# Patient Record
Sex: Male | Born: 1935 | Race: White | Hispanic: No | Smoking: Never smoker
Health system: Southern US, Community
[De-identification: ages and names within clinical notes are randomized; demographics above are authoritative.]

## PROBLEM LIST (undated history)

## (undated) DIAGNOSIS — I10 Essential (primary) hypertension: Secondary | ICD-10-CM

## (undated) DIAGNOSIS — N289 Disorder of kidney and ureter, unspecified: Secondary | ICD-10-CM

## (undated) DIAGNOSIS — G20 Parkinson's disease: Secondary | ICD-10-CM

## (undated) DIAGNOSIS — I639 Cerebral infarction, unspecified: Secondary | ICD-10-CM

## (undated) DIAGNOSIS — N401 Enlarged prostate with lower urinary tract symptoms: Secondary | ICD-10-CM

## (undated) DIAGNOSIS — G20A1 Parkinson's disease without dyskinesia, without mention of fluctuations: Secondary | ICD-10-CM

## (undated) DIAGNOSIS — C801 Malignant (primary) neoplasm, unspecified: Secondary | ICD-10-CM

## (undated) DIAGNOSIS — N138 Other obstructive and reflux uropathy: Secondary | ICD-10-CM

## (undated) HISTORY — PX: APPENDECTOMY: SHX54

## (undated) HISTORY — PX: CYSTOSCOPY: SUR368

## (undated) HISTORY — PX: CATARACT EXTRACTION: SUR2

## (undated) HISTORY — DX: Benign prostatic hyperplasia with lower urinary tract symptoms: N13.8

## (undated) HISTORY — DX: Other obstructive and reflux uropathy: N40.1

## (undated) HISTORY — PX: HEMORRHOID SURGERY: SHX153

## (undated) HISTORY — PX: COLONOSCOPY: SHX174

## (undated) HISTORY — DX: Essential (primary) hypertension: I10

---
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
## 2000-08-11 ENCOUNTER — Encounter: Payer: Self-pay | Admitting: Internal Medicine

## 2000-08-11 ENCOUNTER — Ambulatory Visit (HOSPITAL_COMMUNITY): Admission: RE | Admit: 2000-08-11 | Discharge: 2000-08-11 | Payer: Self-pay | Admitting: Internal Medicine

## 2001-06-07 ENCOUNTER — Encounter: Payer: Self-pay | Admitting: General Surgery

## 2001-06-07 ENCOUNTER — Ambulatory Visit (HOSPITAL_COMMUNITY): Admission: RE | Admit: 2001-06-07 | Discharge: 2001-06-07 | Payer: Self-pay | Admitting: General Surgery

## 2001-08-15 ENCOUNTER — Encounter: Payer: Self-pay | Admitting: Internal Medicine

## 2001-08-15 ENCOUNTER — Ambulatory Visit (HOSPITAL_COMMUNITY): Admission: RE | Admit: 2001-08-15 | Discharge: 2001-08-15 | Payer: Self-pay | Admitting: Internal Medicine

## 2001-10-10 ENCOUNTER — Ambulatory Visit (HOSPITAL_COMMUNITY): Admission: RE | Admit: 2001-10-10 | Discharge: 2001-10-10 | Payer: Self-pay | Admitting: Gastroenterology

## 2006-09-18 ENCOUNTER — Ambulatory Visit (HOSPITAL_COMMUNITY): Admission: RE | Admit: 2006-09-18 | Discharge: 2006-09-18 | Payer: Self-pay | Admitting: Ophthalmology

## 2007-04-24 ENCOUNTER — Ambulatory Visit: Admission: RE | Admit: 2007-04-24 | Discharge: 2007-07-03 | Payer: Self-pay | Admitting: Radiation Oncology

## 2007-05-07 ENCOUNTER — Encounter: Admission: RE | Admit: 2007-05-07 | Discharge: 2007-05-07 | Payer: Self-pay | Admitting: Urology

## 2007-05-07 ENCOUNTER — Ambulatory Visit: Admission: RE | Admit: 2007-05-07 | Discharge: 2007-05-07 | Payer: Self-pay | Admitting: Urology

## 2007-05-31 ENCOUNTER — Ambulatory Visit (HOSPITAL_BASED_OUTPATIENT_CLINIC_OR_DEPARTMENT_OTHER): Admission: RE | Admit: 2007-05-31 | Discharge: 2007-05-31 | Payer: Self-pay | Admitting: Urology

## 2009-11-24 ENCOUNTER — Ambulatory Visit (HOSPITAL_COMMUNITY): Admission: RE | Admit: 2009-11-24 | Discharge: 2009-11-24 | Payer: Self-pay | Admitting: Internal Medicine

## 2010-05-25 ENCOUNTER — Encounter: Payer: Self-pay | Admitting: Cardiovascular Disease

## 2010-06-02 DIAGNOSIS — E559 Vitamin D deficiency, unspecified: Secondary | ICD-10-CM

## 2010-06-02 DIAGNOSIS — I10 Essential (primary) hypertension: Secondary | ICD-10-CM

## 2010-06-03 ENCOUNTER — Ambulatory Visit: Payer: Self-pay | Admitting: Cardiovascular Disease

## 2010-06-03 ENCOUNTER — Encounter (INDEPENDENT_AMBULATORY_CARE_PROVIDER_SITE_OTHER): Payer: Self-pay | Admitting: *Deleted

## 2010-06-03 ENCOUNTER — Encounter: Payer: Self-pay | Admitting: Cardiovascular Disease

## 2010-06-03 DIAGNOSIS — E782 Mixed hyperlipidemia: Secondary | ICD-10-CM

## 2010-06-03 LAB — CONVERTED CEMR LAB
Basophils Absolute: 0 10*3/uL (ref 0.0–0.1)
CO2: 28 meq/L (ref 19–32)
Calcium: 9.9 mg/dL (ref 8.4–10.5)
Chloride: 107 meq/L (ref 96–112)
Eosinophils Absolute: 0.7 10*3/uL (ref 0.0–0.7)
GFR calc non Af Amer: 39.76 mL/min (ref >60)
INR: 0.9 (ref 0.8–1.0)
Lymphocytes Relative: 20.1 % (ref 12.0–46.0)
Lymphs Abs: 1.3 10*3/uL (ref 0.7–4.0)
Platelets: 246 10*3/uL (ref 150.0–400.0)
Potassium: 5.1 meq/L (ref 3.5–5.1)
RDW: 14.2 % (ref 11.5–14.6)
Sodium: 142 meq/L (ref 135–145)

## 2010-06-04 ENCOUNTER — Encounter: Payer: Self-pay | Admitting: Cardiovascular Disease

## 2010-06-04 ENCOUNTER — Inpatient Hospital Stay (HOSPITAL_BASED_OUTPATIENT_CLINIC_OR_DEPARTMENT_OTHER): Admission: RE | Admit: 2010-06-04 | Discharge: 2010-06-04 | Payer: Self-pay | Admitting: Cardiology

## 2010-06-04 ENCOUNTER — Ambulatory Visit: Payer: Self-pay | Admitting: Cardiology

## 2010-06-07 ENCOUNTER — Telehealth: Payer: Self-pay | Admitting: Cardiovascular Disease

## 2010-06-22 ENCOUNTER — Encounter: Payer: Self-pay | Admitting: Cardiovascular Disease

## 2010-06-29 ENCOUNTER — Ambulatory Visit: Payer: Self-pay | Admitting: Cardiovascular Disease

## 2010-07-25 HISTORY — PX: TOTAL NEPHRECTOMY: SHX415

## 2010-08-06 ENCOUNTER — Ambulatory Visit
Admission: RE | Admit: 2010-08-06 | Discharge: 2010-08-06 | Payer: Self-pay | Source: Home / Self Care | Attending: Vascular Surgery | Admitting: Vascular Surgery

## 2010-08-17 ENCOUNTER — Encounter
Admission: RE | Admit: 2010-08-17 | Discharge: 2010-08-17 | Payer: Self-pay | Source: Home / Self Care | Attending: Family Medicine | Admitting: Family Medicine

## 2010-08-20 NOTE — Procedures (Unsigned)
RENAL ARTERY DUPLEX EVALUATION  INDICATION:  Hypertension, abnormal CMP.  HISTORY: Diabetes:  No. Cardiac:  No. Hypertension:  Yes. Smoking:  No.  RENAL ARTERY DUPLEX FINDINGS: Aorta-Proximal:  54 cm/s Aorta-Mid:  Not visualized Aorta-Distal:  61 cm/s Celiac Artery Origin:  Not visualized SMA Origin:  Not visualized                                   RIGHT               LEFT Renal Artery Origin:             Not visualized      Not visualized Renal Artery Proximal:           Not visualized      Not visualized Renal Artery Mid:                143 cm/s            117 cm/s Renal Artery Distal:             101 cm/s            58 cm/s Hilar Acceleration Time (AT): Renal-Aortic Ratio (RAR):        2.6                 2.2 Kidney Size:                     11.3 cm             11.4 cm End Diastolic Ratio (EDR): Resistive Index (RI):            0.80                0.82  IMPRESSION: 1. No evidence of hemodynamically significant stenosis noted in the     visualized segments of the bilateral renal arteries, however, the     mid abdominal aorta, celiac, superior mesenteric and bilateral     proximal renal arteries were not adequately visualized due to     overlying bowel gas patterns.  Abnormal bilateral intrarenal     resistive indices were noted. 2. The bilateral kidney length measurements are within normal limits.     There were 2 cystic structures noted near the right renal pelvis in     the lateral border of the right kidney measuring roughly 4 cm and     2.5 cm, respectively.  There appears to be a 6.5 x 5.8 x 7.5 cm     structure with heterogeneous echo texture and internal flow noted     near the upper pole of the right kidney.  ___________________________________________ Larina Earthly, M.D.  CH/MEDQ  D:  08/06/2010  T:  08/06/2010  Job:  [id number]

## 2010-08-23 ENCOUNTER — Encounter: Payer: Self-pay | Admitting: Cardiology

## 2010-08-24 ENCOUNTER — Encounter: Payer: Self-pay | Admitting: Cardiovascular Disease

## 2010-08-24 ENCOUNTER — Telehealth: Payer: Self-pay | Admitting: Cardiovascular Disease

## 2010-08-24 ENCOUNTER — Ambulatory Visit (HOSPITAL_COMMUNITY)
Admission: RE | Admit: 2010-08-24 | Discharge: 2010-08-24 | Payer: Self-pay | Source: Home / Self Care | Attending: Urology | Admitting: Urology

## 2010-08-26 NOTE — Cardiovascular Report (Signed)
Summary: Pre Cath Orders   Pre Cath Orders   Imported By: Roderic Ovens 06/14/2010 16:37:41  _____________________________________________________________________  External Attachment:    Type:   Image     Comment:   External Document

## 2010-08-26 NOTE — Cardiovascular Report (Signed)
Summary: Cardiac Cath Other  Cardiac Cath Other   Imported By: Earl Many 06/29/2010 16:23:13  _____________________________________________________________________  External Attachment:    Type:   Image     Comment:   Cardiac Cath Review

## 2010-08-26 NOTE — Assessment & Plan Note (Signed)
Summary: F1M/POST CATH/DM   CC:  follow up cath.  History of Present Illness: Gary Ortega returns today for F/U post cath. He was done by Dr Swaziland on 11/11.  He had single vessel disease with 40-50% mid LAD and 70% distal disease.  Angio reviewed and agree with medical RX.  ON BB no angina now even when walking up hill.  Still emotional about wifes dementia and will likely need to place her in assisted living.  They have been married 63 years.  Taking ASA and Plavix for previous TIA symptoms.  Cath site healed well with no bruising  Current Problems (verified): 1)  Mixed Hyperlipidemia  (ICD-272.2) 2)  Hypertension  (ICD-401.9) 3)  Fatigue  (ICD-780.79) 4)  Shortness of Breath  (ICD-786.05) 5)  Chest Pain  (ICD-786.50) 6)  Unspecified Vitamin D Deficiency  (ICD-268.9) 7)  Anxiety  (ICD-300.00) 8)  Depression  (ICD-311)  Current Medications (verified): 1)  Plavix 75 Mg Tabs (Clopidogrel Bisulfate) .Marland Kitchen.. 1 Tab Mon, Wednesday, Fri 2)  Aspirin 81 Mg Tbec (Aspirin) .Marland Kitchen.. 1 Tab  Tues, Thurs, Sat, Sunday 3)  Fenofibrate Micronized 134 Mg Caps (Fenofibrate Micronized) .... 1 Tab By Mouth Once Daily 4)  Pravastatin Sodium 40 Mg Tabs (Pravastatin Sodium) .... 1 Tab By Mouth Once Daily 5)  Lisinopril 10 Mg Tabs (Lisinopril) .... Take One Tablet By Mouth Daily 6)  Multivitamins   Tabs (Multiple Vitamin) .... 1 Tab By Mouth Once Daily 7)  Vitamin D 5000 Unit Caps (Cholecalciferol) .... 1 Tab By Mouth Once Daily 8)  Carbidopa-Levodopa 25-250 Mg Tabs (Carbidopa-Levodopa) .... Two Tablets By Mouth Once Daily 9)  Metoprolol Succinate 25 Mg Xr24h-Tab (Metoprolol Succinate) .... Take One Tablet By Mouth Daily 10)  Nitrostat 0.4 Mg Subl (Nitroglycerin) .... 1 Tablet Under Tongue At Onset of Chest Pain; You May Repeat Every 5 Minutes For Up To 3 Doses.  Allergies (verified): No Known Drug Allergies  Past History:  Past Medical History: Last updated: 06/02/2010 HYPERTENSION FATIGUE SHORTNESS OF BREATH    CHEST PAIN UNSPECIFIED VITAMIN D DEFICIENCY ANXIETY DEPRESSION  Past Surgical History: Last updated: 06/02/2010 Prostate radioactive I-125 seed implantation and cystoscopy.    Colonoscopy.  Family History: Last updated: 06/03/2010 non contributory  Social History: Last updated: 06/03/2010 Married 63 years Wife with recent CVA Retired from [employer] Non smoker Non drinker  Review of Systems       Denies fever, malais, weight loss, blurry vision, decreased visual acuity, cough, sputum, SOB, hemoptysis, pleuritic pain, palpitaitons, heartburn, abdominal pain, melena, lower extremity edema, claudication, or rash.   Vital Signs:  Patient profile:   77 year old male Height:      68 inches Weight:      164 pounds BMI:     25.03 Pulse rate:   70 / minute Resp:     14  per minute BP sitting:   118 / 68  (left arm)  Vitals Entered By: Kem Parkinson (June 29, 2010 4:30 PM)  Physical Exam  General:  Affect appropriate Healthy:  appears stated age HEENT: normal Neck supple with no adenopathy JVP normal no bruits no thyromegaly Lungs clear with no wheezing and good diaphragmatic motion Heart:  S1/S2 no murmur,rub, gallop or click PMI normal Abdomen: benighn, BS positve, no tenderness, no AAA no bruit.  No HSM or HJR Distal pulses intact with no bruits No edema Neuro non-focal Skin warm and dry Cath site well healed   Impression & Recommendations:  Problem # 1:  CHEST PAIN (ICD-786.50)  Single vessel disease responding to medical RX.  Can still add imdur or Ranexa in future if needed.  Continue antiplatlets His updated medication list for this problem includes:    Plavix 75 Mg Tabs (Clopidogrel bisulfate) .Marland Kitchen... 1 tab mon, wednesday, fri    Aspirin 81 Mg Tbec (Aspirin) .Marland Kitchen... 1 tab  tues, thurs, sat, sunday    Lisinopril 10 Mg Tabs (Lisinopril) .Marland Kitchen... Take one tablet by mouth daily    Metoprolol Succinate 25 Mg Xr24h-tab (Metoprolol succinate) .Marland Kitchen... Take one  tablet by mouth daily    Nitrostat 0.4 Mg Subl (Nitroglycerin) .Marland Kitchen... 1 tablet under tongue at onset of chest pain; you may repeat every 5 minutes for up to 3 doses.  Problem # 2:  HYPERTENSION (ICD-401.9) Well controlled His updated medication list for this problem includes:    Aspirin 81 Mg Tbec (Aspirin) .Marland Kitchen... 1 tab  tues, thurs, sat, sunday    Lisinopril 10 Mg Tabs (Lisinopril) .Marland Kitchen... Take one tablet by mouth daily    Metoprolol Succinate 25 Mg Xr24h-tab (Metoprolol succinate) .Marland Kitchen... Take one tablet by mouth daily  Problem # 3:  MIXED HYPERLIPIDEMIA (ICD-272.2) Continue statin  Labs per primary His updated medication list for this problem includes:    Fenofibrate Micronized 134 Mg Caps (Fenofibrate micronized) .Marland Kitchen... 1 tab by mouth once daily    Pravastatin Sodium 40 Mg Tabs (Pravastatin sodium) .Marland Kitchen... 1 tab by mouth once daily  Problem # 4:  DEPRESSION (ICD-311) On Tranxene two times a day.  F/U primary  Will need to place spouse to help relieve anxiety.  Discussed with daughter who agrees  Patient Instructions: 1)  Your physician wants you to follow-up in: 6 MONTHS  You will receive a reminder letter in the mail two months in advance. If you don't receive a letter, please call our office to schedule the follow-up appointment.

## 2010-08-26 NOTE — Consult Note (Signed)
Summary: [facility]  [facility]   Imported By: Earl Many 06/02/2010 16:20:26  _____________________________________________________________________  External Attachment:    Type:   Image     Comment:   External Document

## 2010-08-26 NOTE — Cardiovascular Report (Signed)
Summary: [facility]  [facility]   Imported By: Marylou Mccoy 06/28/2010 14:21:26  _____________________________________________________________________  External Attachment:    Type:   Image     Comment:   External Document

## 2010-08-26 NOTE — Letter (Signed)
Summary: Cardiac Catheterization Instructions- [facility]  [facility], Main Office  [address]   [place], [place] [address]   Phone: [phone]  Fax: [phone]     06/03/2010 MRN: [id number]  [facility] [address] [place], [place]  [address]  Dear Mr. HOLTE,   You are scheduled for a Cardiac Catheterization on THURSDAY 06-04-10 with Dr. Swaziland  Please arrive to the 1st floor of the Heart and Vascular Center at [facility] at     11:30 am      on the day of your procedure. Please do not arrive before 6:30 a.m. Call the Heart and Vascular Center at [phone] if you are unable to make your appointmnet. The Code to get into the parking garage under the building is 2000. Take the elevators to the 1st floor. You must have someone to drive you home. Someone must be with you for the first 24 hours after you arrive home. Please wear clothes that are easy to get on and off and wear slip-on shoes. Do not eat or drink after midnight except water with your medications that morning. Bring all your medications and current insurance cards with you.  ___ DO NOT take these medications before your procedure: ________________________________________________________________  ___ Make sure you take your aspirin.  ___ You may take ALL of your medications with water that morning. ________________________________________________________________________________________________________________________________  ___ DO NOT take ANY medications before your procedure.  _XX__ Pre-med instructions:  TAKE 300MG  OF PLAVIX TONIGHT  The usual length of stay after your procedure is 2 to 3 hours. This can vary.  If you have any questions, please call the office at the number listed above.   Deliah Goody, RN

## 2010-08-26 NOTE — Progress Notes (Signed)
Summary: med refill  Phone Note From Other Clinic Call back at [facility] Phone [phone]   Caller: Nurse Summary of Call: Per Lurena Joiner from Dr Elisabeth Most ofc pt needs his toprol filled.  Initial call taken by: Edman Circle,  June 07, 2010 4:30 PM  Follow-up for Phone Call        spoke with pt, metoprolol was started after his cath. pt is not aware of the dosage and will have his dtr call me Deliah Goody, RN  June 08, 2010 11:38 AM     New/Updated Medications: CARBIDOPA-LEVODOPA 25-250 MG TABS (CARBIDOPA-LEVODOPA) two tablets by mouth once daily METOPROLOL SUCCINATE 25 MG XR24H-TAB (METOPROLOL SUCCINATE) Take one tablet by mouth daily Prescriptions: METOPROLOL SUCCINATE 25 MG XR24H-TAB (METOPROLOL SUCCINATE) Take one tablet by mouth daily  #90 x 4   Entered by:   Deliah Goody, RN   Authorized by:   Colon Branch, MD, [facility]   Signed by:   Deliah Goody, RN on 06/08/2010   Method used:   Faxed to ...       [facility] (mail-order)             , [place]         Ph: [phone]       Fax: [phone]   RxID:   [id number]

## 2010-08-26 NOTE — Assessment & Plan Note (Signed)
Summary: np3/chest pain eval for stress test   CC:  pt states when he walks he gets chest pains.  History of Present Illness: Gary Ortega is a delightful patient referred by Dr Yetta Barre for SSCP.  Pain has been since April.  Central in chest with radiation across to left arm.  Classically exertional and reproducable with going up slight hill.  Episode of rest pain yesterday.  ? TIA early this year and takes Plavix alternating with ASA.  No previous history of CAD.  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  CRF include HTN and elevated lipids.  Has been emotional lately as his wife of 63 years had a stroke in July and is finally hom with him.   Denies associated diaphoresis but does have dyspnea.  Has had multiple "falls" or syncope this year which are not clear in etiology.  No previous high grade heart block.  No palpitations  Issue of angina discussed.  Risks and benefits of cath including possible bleed or CVA discussed  Willing to proceed.  Personally spoke with Dr Swaziland who will do cath in [facility] in a.m.  LABS and CXR today  Current Problems (verified): 1)  Hypertension  (ICD-401.9) 2)  Fatigue  (ICD-780.79) 3)  Shortness of Breath  (ICD-786.05) 4)  Chest Pain  (ICD-786.50) 5)  Unspecified Vitamin D Deficiency  (ICD-268.9) 6)  Anxiety  (ICD-300.00) 7)  Depression  (ICD-311)  Current Medications (verified): 1)  Plavix 75 Mg Tabs (Clopidogrel Bisulfate) .... Take One Tablet By Mouth Daily 2)  Aspirin 81 Mg Tbec (Aspirin) .... Take One Tablet By Mouth Daily 3)  Fenofibrate Micronized 134 Mg Caps (Fenofibrate Micronized) .Marland Kitchen.. 1 Tab By Mouth Once Daily 4)  Pravastatin Sodium 40 Mg Tabs (Pravastatin Sodium) .Marland Kitchen.. 1 Tab By Mouth Once Daily 5)  Lisinopril 10 Mg Tabs (Lisinopril) .... Take One Tablet By Mouth Daily 6)  Celebrex 200 Mg Caps (Celecoxib) .... As Needed 7)  Clorazepate Dipotassium 7.5 Mg Tabs (Clorazepate Dipotassium) .Marland Kitchen.. 1 Tab By Mouth Two Times A Day 8)  Multivitamins   Tabs  (Multiple Vitamin) .Marland Kitchen.. 1 Tab By Mouth Once Daily 9)  Vitamin D 5000 Unit Caps (Cholecalciferol) .Marland Kitchen.. 1 Tab By Mouth Once Daily 10)  Red Yeast Rice .Marland Kitchen.. 1 Tab By Mouth Once Daily 11)  True Aloe .... Daily 12)  Vitanmin C .... 1 Tab By Mouth Once Daily  Allergies (verified): No Known Drug Allergies  Past History:  Past Medical History: Last updated: 06/02/2010 HYPERTENSION FATIGUE SHORTNESS OF BREATH  CHEST PAIN UNSPECIFIED VITAMIN D DEFICIENCY ANXIETY DEPRESSION  Past Surgical History: Last updated: 06/02/2010 Prostate radioactive I-125 seed implantation and cystoscopy.    Colonoscopy.  Family History: Last updated: 06/03/2010 non contributory  Social History: Last updated: 06/03/2010 Married 63 years Wife with recent CVA Retired from [employer] Non smoker Non drinker  Family History: non contributory  Social History: Married 63 years Wife with recent CVA Retired from [employer] Non smoker Non drinker  Review of Systems       Denies fever, malais, weight loss, blurry vision, decreased visual acuity, cough, sputum, SOB, hemoptysis, pleuritic pain, palpitaitons, heartburn, abdominal pain, melena, lower extremity edema, claudication, or rash.   Vital Signs:  Patient profile:   77 year old male Height:      68 inches Weight:      164 pounds BMI:     25.03 Pulse rate:   70 / minute Resp:     14 per minute BP  sitting:   130 / 70  (left arm)  Vitals Entered By: Kem Parkinson (June 03, 2010 9:43 AM)  Physical Exam  General:  Affect appropriate Healthy:  appears stated age HEENT: normal Neck supple with no adenopathy JVP normal no bruits no thyromegaly Lungs clear with no wheezing and good diaphragmatic motion Heart:  S1/S2 no murmur,rub, gallop or click PMI normal Abdomen: benighn, BS positve, no tenderness, no AAA no bruit.  No HSM or HJR Distal pulses intact with no bruits No edema Neuro non-focal Skin warm and dry    Impression &  Recommendations:  Problem # 1:  HYPERTENSION (ICD-401.9) Well controlled His updated medication list for this problem includes:    Aspirin 81 Mg Tbec (Aspirin) .Marland Kitchen... Take one tablet by mouth daily    Lisinopril 10 Mg Tabs (Lisinopril) .Marland Kitchen... Take one tablet by mouth daily  Orders: TLB-BMP (Basic Metabolic Panel-BMET) (80048-METABOL)  Problem # 2:  CHEST PAIN (ICD-786.50) Worrisome for angina.  Has been on some Plavix.  Give 300mg  tonight and cath in am with Dr Swaziland His updated medication list for this problem includes:    Plavix 75 Mg Tabs (Clopidogrel bisulfate) .Marland Kitchen... Take one tablet by mouth daily    Aspirin 81 Mg Tbec (Aspirin) .Marland Kitchen... Take one tablet by mouth daily    Lisinopril 10 Mg Tabs (Lisinopril) .Marland Kitchen... Take one tablet by mouth daily  Orders: TLB-CBC Platelet - w/Differential (85025-CBCD) TLB-PT (Protime) (85610-PTP) T-2 View CXR (71020TC) Cardiac Catheterization (Cardiac Cath)  Problem # 3:  SHORTNESS OF BREATH (ICD-786.05) Weill need further w/u if no significant CAD.  CXR today pre cath.  LV gram and EDP during cath His updated medication list for this problem includes:    Aspirin 81 Mg Tbec (Aspirin) .Marland Kitchen... Take one tablet by mouth daily    Lisinopril 10 Mg Tabs (Lisinopril) .Marland Kitchen... Take one tablet by mouth daily  Problem # 4:  MIXED HYPERLIPIDEMIA (ICD-272.2) Continue dual drug Rx.  LDL goal 70 or lower if CAD found His updated medication list for this problem includes:    Fenofibrate Micronized 134 Mg Caps (Fenofibrate micronized) .Marland Kitchen... 1 tab by mouth once daily    Pravastatin Sodium 40 Mg Tabs (Pravastatin sodium) .Marland Kitchen... 1 tab by mouth once daily  Patient Instructions: 1)  Your physician recommends that you schedule a follow-up appointment in: 4 WEEKS 2)  Your physician has requested that you have a cardiac catheterization.  Cardiac catheterization is used to diagnose and/or treat various heart conditions. Doctors may recommend this procedure for a number of different  reasons. The most common reason is to evaluate chest pain. Chest pain can be a symptom of coronary artery disease (CAD), and cardiac catheterization can show whether plaque is narrowing or blocking your heart's arteries. This procedure is also used to evaluate the valves, as well as measure the blood flow and oxygen levels in different parts of your heart.  For further information please visit [email or web address]  Please follow instruction sheet, as given.   EKG Report  Procedure date:  05/25/2010  Findings:      NSR LAFB

## 2010-09-01 NOTE — Progress Notes (Signed)
Summary: from office notes on 06/29/10- clear for surgery  Phone Note From Other Clinic   Caller: selita office [phone] ext [phone] Request: Talk with Nurse Details of Complaint: surgery on 2/15. or 2/22. from office notes in 06/29/10 can pt be clear for surgery. Initial call taken by: Lorne Skeens,  August 24, 2010 8:58 AM  Follow-up for Phone Call        spoke with selitia, pt has a renal mass and is having a lap radical nephrectomy. paperwork being faxed to be signed Deliah Goody, RN  August 24, 2010 9:11 AM

## 2010-09-02 ENCOUNTER — Telehealth: Payer: Self-pay | Admitting: Cardiovascular Disease

## 2010-09-07 ENCOUNTER — Other Ambulatory Visit: Payer: Self-pay | Admitting: Anesthesiology

## 2010-09-07 ENCOUNTER — Telehealth: Payer: Self-pay | Admitting: Cardiovascular Disease

## 2010-09-07 ENCOUNTER — Other Ambulatory Visit: Payer: Self-pay | Admitting: Urology

## 2010-09-07 ENCOUNTER — Encounter (HOSPITAL_COMMUNITY): Payer: Medicare Other | Attending: Urology

## 2010-09-07 DIAGNOSIS — Z01818 Encounter for other preprocedural examination: Secondary | ICD-10-CM | POA: Insufficient documentation

## 2010-09-07 LAB — CBC
HCT: 41.3 % (ref 39.0–52.0)
Hemoglobin: 13.6 g/dL (ref 13.0–17.0)
MCV: 87.9 fL (ref 78.0–100.0)
Platelets: 264 10*3/uL (ref 150–400)
RDW: 13.6 % (ref 11.5–15.5)
WBC: 7 10*3/uL (ref 4.0–10.5)

## 2010-09-07 LAB — BASIC METABOLIC PANEL
BUN: 37 mg/dL — ABNORMAL HIGH (ref 6–23)
Calcium: 9.6 mg/dL (ref 8.4–10.5)
Chloride: 106 mEq/L (ref 96–112)
Creatinine, Ser: 1.71 mg/dL — ABNORMAL HIGH (ref 0.4–1.5)
Sodium: 140 mEq/L (ref 135–145)

## 2010-09-07 LAB — SURGICAL PCR SCREEN
MRSA, PCR: NEGATIVE
Staphylococcus aureus: NEGATIVE

## 2010-09-09 NOTE — Progress Notes (Signed)
Summary: need clearance for surgery  Phone Note Call from Patient Call back at [phone]   Caller: Daughter/ Durward Mallard Summary of Call: Calling to get clearance for pt to have surgery. Initial call taken by: Judie Grieve,  September 02, 2010 8:33 AM  Follow-up for Phone Call        left message for pts dtr, we have the paperwork for clearence and will be able to get completed when dr Eden Emms returns next week Deliah Goody, RN  September 02, 2010 10:09 AM

## 2010-09-15 ENCOUNTER — Inpatient Hospital Stay (HOSPITAL_COMMUNITY)
Admission: RE | Admit: 2010-09-15 | Discharge: 2010-09-21 | DRG: 658 | Disposition: A | Discharge status: Home Health | Payer: Medicare Other | Source: Ambulatory Visit | Attending: Urology | Admitting: Urology

## 2010-09-15 ENCOUNTER — Other Ambulatory Visit: Payer: Self-pay | Admitting: Urology

## 2010-09-15 ENCOUNTER — Encounter: Payer: Self-pay | Admitting: Cardiovascular Disease

## 2010-09-15 DIAGNOSIS — I2510 Atherosclerotic heart disease of native coronary artery without angina pectoris: Secondary | ICD-10-CM | POA: Diagnosis present

## 2010-09-15 DIAGNOSIS — R5082 Postprocedural fever: Secondary | ICD-10-CM | POA: Diagnosis not present

## 2010-09-15 DIAGNOSIS — D3000 Benign neoplasm of unspecified kidney: Principal | ICD-10-CM | POA: Diagnosis present

## 2010-09-15 DIAGNOSIS — I129 Hypertensive chronic kidney disease with stage 1 through stage 4 chronic kidney disease, or unspecified chronic kidney disease: Secondary | ICD-10-CM | POA: Diagnosis present

## 2010-09-15 DIAGNOSIS — N189 Chronic kidney disease, unspecified: Secondary | ICD-10-CM | POA: Diagnosis present

## 2010-09-15 DIAGNOSIS — Z8546 Personal history of malignant neoplasm of prostate: Secondary | ICD-10-CM

## 2010-09-15 DIAGNOSIS — F411 Generalized anxiety disorder: Secondary | ICD-10-CM | POA: Diagnosis present

## 2010-09-15 LAB — TYPE AND SCREEN

## 2010-09-15 NOTE — Progress Notes (Signed)
Summary: fax clearance  Phone Note From Other Clinic   Caller: [facility] office [phone]. fax [phone] Request: Talk with Nurse Summary of Call: fax clearance. Initial call taken by: Lorne Skeens,  September 07, 2010 12:29 PM  Follow-up for Phone Call        clearence paperwork from [facility] urology faxed to number provided Deliah Goody, RN  September 08, 2010 1:32 PM

## 2010-09-16 ENCOUNTER — Inpatient Hospital Stay (HOSPITAL_COMMUNITY): Payer: Medicare Other

## 2010-09-16 LAB — BASIC METABOLIC PANEL
BUN: 27 mg/dL — ABNORMAL HIGH (ref 6–23)
Calcium: 8.4 mg/dL (ref 8.4–10.5)
Chloride: 103 mEq/L (ref 96–112)
Creatinine, Ser: 2.51 mg/dL — ABNORMAL HIGH (ref 0.4–1.5)
GFR calc Af Amer: 30 mL/min — ABNORMAL LOW (ref >60)

## 2010-09-16 LAB — CBC
HCT: 34.2 % — ABNORMAL LOW (ref 39.0–52.0)
MCV: 87.9 fL (ref 78.0–100.0)
RBC: 3.89 MIL/uL — ABNORMAL LOW (ref 4.22–5.81)
RDW: 13.5 % (ref 11.5–15.5)
WBC: 12.3 10*3/uL — ABNORMAL HIGH (ref 4.0–10.5)

## 2010-09-16 LAB — DIFFERENTIAL
Basophils Absolute: 0 10*3/uL (ref 0.0–0.1)
Eosinophils Absolute: 0 10*3/uL (ref 0.0–0.7)
Eosinophils Relative: 0 % (ref 0–5)
Lymphocytes Relative: 10 % — ABNORMAL LOW (ref 12–46)
Monocytes Absolute: 1.3 10*3/uL — ABNORMAL HIGH (ref 0.1–1.0)
Monocytes Relative: 10 % (ref 3–12)
Neutrophils Relative %: 79 % — ABNORMAL HIGH (ref 43–77)

## 2010-09-17 LAB — BASIC METABOLIC PANEL
BUN: 26 mg/dL — ABNORMAL HIGH (ref 6–23)
Chloride: 103 mEq/L (ref 96–112)
GFR calc Af Amer: 28 mL/min — ABNORMAL LOW (ref >60)
Glucose, Bld: 131 mg/dL — ABNORMAL HIGH (ref 70–99)
Sodium: 136 mEq/L (ref 135–145)

## 2010-09-18 LAB — BASIC METABOLIC PANEL
BUN: 22 mg/dL (ref 6–23)
CO2: 27 mEq/L (ref 19–32)
Calcium: 8.7 mg/dL (ref 8.4–10.5)
Chloride: 104 mEq/L (ref 96–112)
Creatinine, Ser: 2.48 mg/dL — ABNORMAL HIGH (ref 0.4–1.5)
Glucose, Bld: 124 mg/dL — ABNORMAL HIGH (ref 70–99)
Sodium: 138 mEq/L (ref 135–145)

## 2010-09-18 LAB — CBC
Hemoglobin: 11 g/dL — ABNORMAL LOW (ref 13.0–17.0)
MCH: 28.1 pg (ref 26.0–34.0)
MCHC: 32.3 g/dL (ref 30.0–36.0)
RBC: 3.91 MIL/uL — ABNORMAL LOW (ref 4.22–5.81)

## 2010-09-20 LAB — BASIC METABOLIC PANEL
CO2: 27 mEq/L (ref 19–32)
Chloride: 104 mEq/L (ref 96–112)

## 2010-09-20 LAB — DIFFERENTIAL
Basophils Relative: 1 % (ref 0–1)
Eosinophils Absolute: 0.8 10*3/uL — ABNORMAL HIGH (ref 0.0–0.7)
Lymphocytes Relative: 15 % (ref 12–46)
Monocytes Absolute: 0.8 10*3/uL (ref 0.1–1.0)
Neutro Abs: 4.6 10*3/uL (ref 1.7–7.7)
Neutrophils Relative %: 63 % (ref 43–77)

## 2010-09-20 LAB — CBC
Hemoglobin: 10.1 g/dL — ABNORMAL LOW (ref 13.0–17.0)
MCHC: 32.4 g/dL (ref 30.0–36.0)
MCV: 86.9 fL (ref 78.0–100.0)
WBC: 7.4 10*3/uL (ref 4.0–10.5)

## 2010-09-23 LAB — CULTURE, BLOOD (ROUTINE X 2)
Culture  Setup Time: 201202240124
Culture  Setup Time: 201202240124
Culture: NO GROWTH
Culture: NO GROWTH

## 2010-09-23 NOTE — Discharge Summary (Signed)
  NAMEQUANTRELL, SPLITT           ACCOUNT NO.:  [id number]  MEDICAL RECORD NO.:  [id number]           PATIENT TYPE:  I  LOCATION:  1414                         FACILITY:  [facility]  PHYSICIAN:  Bertram Millard. Ashya Nicolaisen, M.D.DATE OF BIRTH:  12-Sep-1924  DATE OF ADMISSION:  09/15/2010 DATE OF DISCHARGE:  09/21/2010                              DISCHARGE SUMMARY   PRIMARY DIAGNOSIS:  Oncocytoma, 7 cm, right kidney.  OTHER DIAGNOSES: 1. History of prostate cancer, status post radiotherapy. 2. History of appendectomy. 3. History of coronary artery disease. 4. Renal insufficiency, chronic.  PRINCIPAL PROCEDURES:  September 15, 2010, laparoscopic-assisted right radical nephrectomy.  BRIEF HISTORY:  This 77 year old male is admitted for definitive management of right renal mass.  He recently had an ultrasound which revealed 7.5 cm renal mass.  This was confirmed by an MRI which showed possible extension of the perinephric fat.  The patient had a negative metastatic evaluation and presents at this time for surgical management.  HOSPITAL COURSE:  The patient was admitted directly to the operating room where, after proper identification of the patient, right radical nephrectomy was performed with laparoscopic assisted.  He tolerated the procedure well, and he had an uncomplicated postoperative course with the exception of one postoperative fever which was approximately 102. He was eventually advanced to a regular diet without difficulty.  He did have a fair number of stools 2 days before discharge.  At the time of this note, C diff titers were pending.  His creatinine stabilized at approximately 2.5 postoperatively.  His hemoglobin remained stable.  He had good urinary output, and eventually voided without difficulty.  DISCHARGE DISPOSITION:  He was discharged to home with home healthcare/physical therapy assistance on September 21, 2010.  At that time, medications included: 1. Fish oil 1000  mg p.o. daily. 2. Vitamin C 500 mg daily. 3. Vitamin D3 is 500 units daily. 4. Metoprolol XL 25 mg q.a.m. 5. Carbidopa/levodopa 25/250 mg p.o. twice daily. 6. Clorazepate 7.5 mg daily. 7. Lisinopril 10 mg p.o. b.i.d. 8. Pravastatin 40 mg p.o. at bedtime. 9. Fenofibrate acid 134 mg 1 p.o. at bedtime. 10.Enteric-coated aspirin 81 mg on Tuesday, Thursday, Saturday, and     Sunday. 11.Plavix 75 mg on Monday, Wednesday, and Friday. 12.Nitroglycerin sublingual as needed. 13.Multivitamin daily.  FOLLOWUP:  He will follow in my office in approximately 3 days for staple removal.  The patient's pathology revealed an oncocytoma, which is a benign lesion.  It is quite large for this, however.     Bertram Millard. Teagyn Fishel, M.D.     SMD/MEDQ  D:  09/21/2010  T:  09/21/2010  Job:  [id number]  cc:   Lucky Cowboy, M.D. Fax: [phone]  Noralyn Pick. Eden Emms, MD, [facility] [address]  [address] [place] [address]  Electronically Signed by Marcine Matar M.D. on 09/23/2010 07:56:44 AM

## 2010-09-23 NOTE — Op Note (Signed)
Gary Ortega, Gary Ortega           ACCOUNT NO.:  [id number]  MEDICAL RECORD NO.:  [id number]           PATIENT TYPE:  I  LOCATION:  X003                         FACILITY:  [facility]  PHYSICIAN:  Bertram Millard. Rafael Quesada, M.D.DATE OF BIRTH:  [birth year]  DATE OF PROCEDURE:  09/15/2010 DATE OF DISCHARGE:                              OPERATIVE REPORT   PREOPERATIVE DIAGNOSIS:  Right renal mass.  POSTOPERATIVE DIAGNOSIS:  Right renal mass.  PRINCIPAL PROCEDURES:  Laparoscopic-assisted right radical nephrectomy.  SURGEON:  Bertram Millard. Raseel Jans, MD  FIRST ASSISTANTS:  Dr. Annabell Howells, Dr. Pernell Dupre, nurse practitioner.  ANESTHESIA:  General endotracheal.  COMPLICATIONS:  None.  SPECIMEN:  Right kidney.  ESTIMATED BLOOD LOSS:  150 cc.  BRIEF HISTORY:  This 77 year old male presents at this time for definitive surgical management of a right renal mass.  This was found incidentally on an ultrasound which was performed for renal insufficiency.  The patient had a negative metastatic survey.  He does have 5 cm x 7.5 cm right renal mass.  He is aware of the treatment which consists of right radical nephrectomy.  Due to the large, deeply invasive nature of the tumor, he is not felt to be a candidate for nephron-sparing procedure.  He is aware of risks and complications of surgery, including but not limited to infection, bleeding, transfusion, DVT, PE, injury to surrounding organs and need to openly convert the procedure.  DESCRIPTION OF PROCEDURE:  The patient was identified in the holding area.  He received preoperative IV antibiotics.  He was taken to the operating room where general anesthetic was administered.  His bladder was catheterized with a Foley catheter.  He was placed in the right side up position with the table flexed.  He was positioned using a beanbag. All extremities were appropriately padded.  The patient's abdomen was prepped with DuraPrep.  He was then draped.  Time-out was then  called.  The procedure then commenced.  A 12-mm incision was made inferior to the umbilicus and carried down into the peritoneum.  The Hassan cannula was placed and fixed with 0 Vicryl sutures.  Two other 5-mm ports were placed, one in the right upper quadrant in the anterior axillary line and one just to the right of the midline in the subxiphoid area. Additionally, a 12-mm port was placed in the right lower quadrant approximately 6 cm lateral to the subumbilical port site.  All trocars were placed under direct vision.  Marcaine was used to infiltrate the subcutaneous tissues prior to placement.  Following inspection of the abdomen, there were significant adhesions between the colon and the anterior abdominal wall underneath the patient's previous appendectomy scar.  These were taken down sharply with scissors and harmonic scalpel. The right colon was then mobilized and freed from the surrounding overlying Gerota's fascia.  I then identified the duodenum which was Kocherized .  The vena cava was identified and dissected from the approximate area of the right renal vein all the way down below the right kidney.  The dissection was then carried out medial to where the right ureter would be and the dissection was carried down to the anterior psoas  fascia.  I then identified the right ureter which was clipped and divided.  I did not see a gonadal vein in this area.  The inferior pole of the kidney was freed up with the harmonic scalpel. Dissection was then carried out superiorly, posterior to the kidney with electrocautery and with harmonic scalpel and blunt dissection.  I then encountered a small accessory lower pole renal vein as well as the renal artery which was easily identified.  These were skeletonized, the vein was doubly clipped medially and singly clipped laterally and divided. Purple Hem-o-Locks were then used to doubly clip the renal artery medially and singly laterally.   Following this, the small renal vein and renal artery were divided.  Access was then gained to the larger renal vein located just superior to the artery.  This was skeletonized, doubly clipped with gold clips medially and singly clipped laterally.  This was then divided.  The remainder of the operation was spent dividing the attachments, both laterally and superiorly; between the kidney, the peritoneum below the liver, and the lateral peritoneal attachments. Once these were taken care of, the kidney was totally freed up.  The dissection took [place] across the lateral bed of the right adrenal.  There was minimal bleeding at this point, noted with the pneumoperitoneum released.  I then placed Surgiflo over the adrenal bed and along the superior attachments of the kidney below the liver.  No other bleeding was seen and the hilar vessels were all nicely clipped doubly on their stumps.  The remaining renal bed was inspected with the pneumoperitoneum released.  No bleeding was seen.  A 0 Vicryl suture was placed in the right lower quadrants incision, 12 mm site, with a suture passer.  I then placed the EndoCatch bag through the midline incision and entrapped the kidney and perinephric fat.  The bag was then seemed tight, and brought through the midline incision.  The trocars were all removed under direct vision with no bleeding seen.  The midline abdominal incision was then opened up just enough to admit the kidney and the EndoCatch bag.  This was sent for permanent section.  The midline incision was then closed with a running #1 PDS.  The right lower quadrant incision was closed with the previously placed 0 Vicryl tie.  Skin staples were then placed on all 4 incisions.  Dry sterile dressings were placed.  The patient tolerated the procedure well.  Estimated blood loss was approximately 150 cc.  He was awakened and then taken to PACU in stable condition.     Bertram Millard. Retta Diones,  M.D.     SMD/MEDQ  D:  09/15/2010  T:  09/15/2010  Job:  [id number]  Electronically Signed by Marcine Matar M.D. on 09/23/2010 07:56:40 AM

## 2010-10-05 NOTE — Letter (Signed)
Summary: [facility]  [facility]   Imported By: Kassie Mends 09/29/2010 10:40:04  _____________________________________________________________________  External Attachment:    Type:   Image     Comment:   External Document

## 2010-10-05 NOTE — Letter (Signed)
Summary: [facility]  [facility]   Imported By: Kassie Mends 09/29/2010 11:03:54  _____________________________________________________________________  External Attachment:    Type:   Image     Comment:   External Document

## 2010-10-05 NOTE — Letter (Signed)
Summary: [facility]  [facility]   Imported By: Marylou Mccoy 09/24/2010 16:23:51  _____________________________________________________________________  External Attachment:    Type:   Image     Comment:   External Document

## 2010-12-07 NOTE — Op Note (Signed)
NAMECROCKETT, Gary Ortega           ACCOUNT NO.:  [id number]   MEDICAL RECORD NO.:  [id number]          PATIENT TYPE:  AMB   LOCATION:  NESC                         FACILITY:  [facility]   PHYSICIAN:  Excell Seltzer. Annabell Howells, M.D.    DATE OF BIRTH:  [birth year]   DATE OF PROCEDURE:  05/31/2007  DATE OF DISCHARGE:                               OPERATIVE REPORT   PROCEDURE:  Prostate radioactive I-125 seed implantation and cystoscopy.   PREOPERATIVE DIAGNOSIS:  Prostate cancer.   POSTOPERATIVE DIAGNOSIS:  Prostate cancer.   SURGEON:  Excell Seltzer. Annabell Howells, M.D.   RADIATION ONCOLOGIST:  Maryln Gottron, M.D.   ANESTHESIA:  General.   DRAIN:  Foley catheter.   COMPLICATIONS:  None.   INDICATIONS:  Gary Ortega is a 77 year old white male with a Gleason 6  prostate cancer who has elected brachytherapy for his treatment.   FINDINGS AND PROCEDURE:  The patient was taken to the operating room.  He received preoperative Cipro.  General anesthetic was induced.  He was  placed in the lithotomy position.  The ultrasound probe was assembled  and inserted.  The real time dosimetry and plan was then performed by  Dr. Dayton Scrape and the radiation physicist.  The perineum was prepped and  the patient was draped.  A red rubber catheter had been placed in the  rectum.  The needle guide was inserted and stabilization needles were  placed in   After completion of the plan I then placed the needles per the treatment  plan.  A total of 23 needles with 57 seeds were implanted.  Please see  the treatment seed sheet for details.  There were no complications  during the implant portion of the procedure.  Once all seeds were  implanted the stabilization needles were released, the probe was removed  and fluoroscopic images were obtained to document seed placement.   The drapes were removed.  The Foley catheter was removed.  The genitalia  was reprepped with Betadine.  Cystoscopy was performed using a 16-French  flexible  scope.  Examination revealed a normal urethra.  The external  sphincter was intact.  The prostatic urethra had bilobar hyperplasia  with some obstruction.  Examination of bladder revealed mild  trabeculation.  No tumors, stones or inflammation were noted.  Ureteral  orifices were unremarkable.  There was a small area of catheter  irritation on the posterior wall.  No seeds or clots were noted in the  bladder.   A fresh 16-French Foley catheter was inserted.  The balloon was filled  with 10 mL sterile fluid.  The catheter was placed straight drainage.  The patient's perineum was cleaned, dressing was applied.  He was taken  down from lithotomy position.  His anesthetic was reversed.  He was  admitted to the recovery room in stable condition.      Excell Seltzer. Annabell Howells, M.D.  Electronically Signed     JJW/MEDQ  D:  05/31/2007  T:  05/31/2007  Job:  [id number]   cc:   Maryln Gottron, M.D.  Fax: [phone]   Lucky Cowboy, M.D.  Fax: [phone]

## 2010-12-10 NOTE — Op Note (Signed)
Gary Ortega, Gary Ortega           ACCOUNT NO.:  [id number]   MEDICAL RECORD NO.:  [id number]          PATIENT TYPE:  AMB   LOCATION:  SDS                          FACILITY:  [facility]   PHYSICIAN:  Jillyn Hidden A. Rankin, M.D.   DATE OF BIRTH:  04/06/1925   DATE OF PROCEDURE:  09/18/2006  DATE OF DISCHARGE:                               OPERATIVE REPORT   PREOPERATIVE DIAGNOSES:  1. 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  2. Status post recent cataract extraction and intraocular lens      placement, right eye.   POSTOPERATIVE DIAGNOSES:  1. 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  2. Status post recent cataract extraction and intraocular lens      placement, right eye.   SURGICAL PROCEDURES:  1. Posterior vitrectomy, right eye.  2. Removal of pupillary fibroid membrane with posterior synechialysis.  3. Vitreous cultures, right eye.  4. Injection of antibiotics ceftazidime and vancomycin in the vitreous      concentration of vancomycin 1.0 mg/0.1 mL of volume 0.1 mL and      Fortaz of 2.25 mg/0.1 mL of volume 0.1 mL.   SURGEON:  Alford Highland. Rankin, M.D.   ANESTHESIA:  Local retrobulbar with monitored anesthesia control.   INDICATIONS FOR PROCEDURE:  Patient is a 77 year old man who is 5 days  status post cataract extraction and intraocular lens placement who has  had progressive clouding and loss of vision over the last 3 days.  Found  to have hand motion vision today and suspected endophthalmitis with  hypopyon in the anterior chamber and significant vitreous debris and  profound vision loss in the right eye.  Patient understands this is an  attempt to provide diagnosis as well as therapeutic intervention today.  He understands the risks of anesthesia including the rare occurrence of  death, but more importantly to the eye from the underlying condition and  the surgical repair including, but not limited to hemorrhage, infection,  scarring, need for further surgery, no change in vision, loss  of vision,  and progression of disease despite intervention.  Appropriate signed  consent was obtained.   The patient was taken to the operating room.  In the operating room  appropriate monitoring was followed by mild sedation.  Marcaine 0.75%  was delivered, 5 mL retrobulbar followed by an additional 5 mL, the  latter in the fashion of a modified [employer]  The right periocular  region was sterilely prepped and draped in the usual ophthalmic fashion.  A lid speculum applied.  A 25-gauge trocar system was then placed in the  vitreous cavity.  Superior trocar is applied.  Core vitrectomy was then  begun.  Attempted initially with a 26-gauge needle to aspirate clear  vitreous through one of the trocar sites was unsuccessful.  For this  reason vitrectomy was not started.  Prior to this the anterior chamber  wash out was completed initially with aspiration of the anterior chamber  contents and mobilization of some of the anterior chamber debris which  is plated directed on the culture plates.  The anterior chamber was  irrigated copiously with BSS with a 30-gauge needle through  a  paracentesis incision.  This cleared the chamber enough to allow  visualization of pupillary fibroid membrane.  This was also engaged and  removed using a needling technique.   At this time, core vitrectomy was then begun.  Significant vitreous  debris was noted and the visual axis was cleared.  Care was taken to  avoid aggressive removal of peripheral vitreous.  There was no apparent  vitreous attached to the posterior pole.   The optic nerve was seen somewhat hazily, but was seen and appeared to  be intact.  There did not appear to be significant hemorrhages nor  abscess formation on the preretinal areas or in the retina or substance  of the retina.   At this time the instruments were removed from the eye and low infusion  pressures were left in place so as to allow for room for the  intravitreal  antibiotics.  The trocar is superiorly removed.  The  infusion was removed.  Volume of 0.1 mL of each of vancomycin and Elita Quick  were then injected in separate sites.  Remnants of these antibiotics  were then placed in a subconjunctival fashion.   Sterile patch and Fox shield applied.  The patient tolerated the  procedure without complications.      Alford Highland Rankin, M.D.  Electronically Signed     GAR/MEDQ  D:  09/18/2006  T:  09/19/2006  Job:  [id number]   cc:   Loraine Leriche T. Nile Riggs, M.D.

## 2010-12-10 NOTE — Procedures (Signed)
[place]  Patient:    Gary Ortega, Gary Ortega Visit Number: [id number] MRN: [id number]          Service Type: END Location: ENDO Attending Physician:  Orland Mustard Dictated by:   Llana Aliment. Randa Evens, M.D. Proc. Date: 10/10/01 Admit Date:  10/10/2001   CC:         Marinus Maw, M.D.  Timothy E. Earlene Plater, M.D.   Procedure Report  DATE OF BIRTH:  1925/04/21  PROCEDURE PERFORMED:  Colonoscopy.  ENDOSCOPIST:  Llana Aliment. Randa Evens, M.D.  MEDICATIONS USED:  Fentanyl 70 mcg, Versed 7 mg IV.  INDICATIONS:  Colon cancer screening.  Gary Ortega has had anal fissure repair with intermittent bleeding.  DESCRIPTION OF PROCEDURE:  The procedure had been explained to the patient and consent obtained.  With the patient in the left lateral decubitus position, the pediatric video colonoscope was inserted and advanced under direct visualization.  We were able to advance to the cecum without difficulty.  The ileocecal valve and appendiceal orifice were seen.  The scope was withdrawn and the cecum, ascending colon, hepatic flexure, transverse colon, splenic flexure, descending and sigmoid colon were seen well upon withdrawal.  No polyps or other lesions were seen.  There was moderate diverticular disease in the sigmoid colon.  The anal canal and the rectum were free of any polyps or any other gross lesions.  The patient tolerated the procedure well.  Was maintained on low-flow oxygen and pulse oximeter throughout the procedure.  ASSESSMENT: 1. Essentially normal colonoscopy.  I suspect this bleeding was probably due    to the anal fissure ____________ 2. Moderate diverticulosis.  PLAN:  Wound recommend yearly hemoccults and consider further screening in five to 10 years depending on the recommendations at that time. Dictated by:   Llana Aliment. Randa Evens, M.D. Attending Physician:  Orland Mustard DD:  10/10/01 TD:  10/11/01 Job:  [id number]

## 2010-12-22 ENCOUNTER — Encounter: Payer: Self-pay | Admitting: Cardiovascular Disease

## 2010-12-30 ENCOUNTER — Ambulatory Visit (INDEPENDENT_AMBULATORY_CARE_PROVIDER_SITE_OTHER): Payer: Medicare Other | Admitting: Cardiovascular Disease

## 2010-12-30 ENCOUNTER — Encounter: Payer: Self-pay | Admitting: Cardiovascular Disease

## 2010-12-30 DIAGNOSIS — I10 Essential (primary) hypertension: Secondary | ICD-10-CM

## 2010-12-30 DIAGNOSIS — R079 Chest pain, unspecified: Secondary | ICD-10-CM

## 2010-12-30 DIAGNOSIS — E782 Mixed hyperlipidemia: Secondary | ICD-10-CM

## 2010-12-30 NOTE — Assessment & Plan Note (Signed)
Well controlled.  Continue current medications and low sodium Dash type diet.    

## 2010-12-30 NOTE — Progress Notes (Signed)
F/U CAD.  11/11 moderate 50-60% mid LAD disease on cath by Dr [place].  2/12 had succesful right nephrectomy without complications   ON BB no angina now even when walking up hill.  Still emotional about wifes dementia and will likely need to place her in assisted living.  They have been married 63 years.  Taking ASA and Plavix for previous TIA symptoms.  Occasional SSCP but has ot taken nitro.  No prolonged episodes and not always exertional so I am not sure SSCP is always angina with only moderate LAD disease.    ROS: Denies fever, malais, weight loss, blurry vision, decreased visual acuity, cough, sputum, SOB, hemoptysis, pleuritic pain, palpitaitons, heartburn, abdominal pain, melena, lower extremity edema, claudication, or rash.  All other systems reviewed and negative  General: Affect appropriate Healthy:  appears stated age HEENT: normal Neck supple with no adenopathy JVP normal no bruits no thyromegaly Lungs clear with no wheezing and good diaphragmatic motion Heart:  S1/S2 no murmur,rub, gallop or click PMI normal Abdomen: benighn, BS positve, no tenderness, no AAA no bruit.  No HSM or HJR Distal pulses intact with no bruits No edema Neuro non-focal Skin warm and dry No muscular weakness   Current Outpatient Prescriptions  Medication Sig Dispense Refill  . aspirin (ASPIR-81) 81 MG EC tablet Take 81 mg by mouth. 1 tab tues, thurs, sat, Sunday        . carbidopa-levodopa (SINEMET) 25-250 MG per tablet Take 1 tablet by mouth 2 (two) times daily. Take 2 tablets       . Cholecalciferol (VITAMIN D3) 5000 UNITS CAPS Take by mouth daily.        . clopidogrel (PLAVIX) 75 MG tablet Take 75 mg by mouth daily. 1 tab mon, wed, fri       . clorazepate (TRANXENE) 7.5 MG tablet Take 7.5 mg by mouth 2 (two) times daily.        . fenofibrate micronized (LOFIBRA) 134 MG capsule Take 134 mg by mouth daily.        . metoprolol succinate (TOPROL-XL) 25 MG 24 hr tablet Take 25 mg by mouth daily.         . multivitamin (THERAGRAN) per tablet Take 1 tablet by mouth daily.        . nitroGLYCERIN (NITROSTAT) 0.4 MG SL tablet Place 0.4 mg under the tongue. One tablet under tongue at onset of chest pain; may repeat every 5 minutes for up to 3 doses       . pravastatin (PRAVACHOL) 40 MG tablet Take 40 mg by mouth daily.          Allergies  Review of patient's allergies indicates no known allergies.  Electrocardiogram:  Assessment and Plan

## 2010-12-30 NOTE — Patient Instructions (Signed)
Your physician wants you to follow-up in: 6 MONTHS You will receive a reminder letter in the mail two months in advance. If you don't receive a letter, please call our office to schedule the follow-up appointment. 

## 2010-12-30 NOTE — Assessment & Plan Note (Signed)
Moderate LAD disease cath 2011.  Continue ASA, Plavix and BB.  F/U 6 months.  Will call if he has to use nitro

## 2010-12-30 NOTE — Assessment & Plan Note (Signed)
Cholesterol is at goal.  Continue current dose of statin and diet Rx.  No myalgias or side effects.  F/U  LFT's in 6 months. No results found for this basename: LDLCALC             

## 2011-01-04 ENCOUNTER — Telehealth: Payer: Self-pay | Admitting: Cardiovascular Disease

## 2011-01-04 NOTE — Telephone Encounter (Signed)
LOV faxet to [facility] @ [phone]  01/04/11/km

## 2011-02-10 ENCOUNTER — Emergency Department (HOSPITAL_COMMUNITY): Payer: Medicare Other

## 2011-02-10 ENCOUNTER — Inpatient Hospital Stay (HOSPITAL_COMMUNITY)
Admission: EM | Admit: 2011-02-10 | Discharge: 2011-02-14 | DRG: 065 | Disposition: A | Discharge status: Rehabilitation Facility | Payer: Medicare Other | Attending: Internal Medicine | Admitting: Internal Medicine

## 2011-02-10 ENCOUNTER — Encounter (HOSPITAL_COMMUNITY): Payer: Self-pay | Admitting: Radiology

## 2011-02-10 DIAGNOSIS — I129 Hypertensive chronic kidney disease with stage 1 through stage 4 chronic kidney disease, or unspecified chronic kidney disease: Secondary | ICD-10-CM | POA: Diagnosis present

## 2011-02-10 DIAGNOSIS — I63219 Cerebral infarction due to unspecified occlusion or stenosis of unspecified vertebral arteries: Principal | ICD-10-CM | POA: Diagnosis present

## 2011-02-10 DIAGNOSIS — G20A1 Parkinson's disease without dyskinesia, without mention of fluctuations: Secondary | ICD-10-CM | POA: Diagnosis present

## 2011-02-10 DIAGNOSIS — N184 Chronic kidney disease, stage 4 (severe): Secondary | ICD-10-CM | POA: Diagnosis present

## 2011-02-10 DIAGNOSIS — G20 Parkinson's disease: Secondary | ICD-10-CM | POA: Diagnosis present

## 2011-02-10 DIAGNOSIS — I2510 Atherosclerotic heart disease of native coronary artery without angina pectoris: Secondary | ICD-10-CM | POA: Diagnosis present

## 2011-02-10 DIAGNOSIS — E785 Hyperlipidemia, unspecified: Secondary | ICD-10-CM | POA: Diagnosis present

## 2011-02-10 DIAGNOSIS — R269 Unspecified abnormalities of gait and mobility: Secondary | ICD-10-CM | POA: Diagnosis present

## 2011-02-10 DIAGNOSIS — Z8546 Personal history of malignant neoplasm of prostate: Secondary | ICD-10-CM

## 2011-02-10 LAB — DIFFERENTIAL
Eosinophils Absolute: 0.1 10*3/uL (ref 0.0–0.7)
Eosinophils Relative: 2 % (ref 0–5)
Lymphocytes Relative: 10 % — ABNORMAL LOW (ref 12–46)
Lymphs Abs: 0.8 10*3/uL (ref 0.7–4.0)
Monocytes Absolute: 0.7 10*3/uL (ref 0.1–1.0)
Monocytes Relative: 8 % (ref 3–12)

## 2011-02-10 LAB — CBC
HCT: 38.8 % — ABNORMAL LOW (ref 39.0–52.0)
MCH: 29.4 pg (ref 26.0–34.0)
MCHC: 34 g/dL (ref 30.0–36.0)
MCV: 86.4 fL (ref 78.0–100.0)
Platelets: 203 10*3/uL (ref 150–400)
RDW: 13.8 % (ref 11.5–15.5)
WBC: 8.2 10*3/uL (ref 4.0–10.5)

## 2011-02-10 LAB — BASIC METABOLIC PANEL
CO2: 24 mEq/L (ref 19–32)
Chloride: 102 mEq/L (ref 96–112)
GFR calc non Af Amer: 26 mL/min — ABNORMAL LOW (ref >60)
Glucose, Bld: 113 mg/dL — ABNORMAL HIGH (ref 70–99)

## 2011-02-11 DIAGNOSIS — I359 Nonrheumatic aortic valve disorder, unspecified: Secondary | ICD-10-CM

## 2011-02-11 DIAGNOSIS — I6350 Cerebral infarction due to unspecified occlusion or stenosis of unspecified cerebral artery: Secondary | ICD-10-CM

## 2011-02-11 LAB — COMPREHENSIVE METABOLIC PANEL
Alkaline Phosphatase: 26 U/L — ABNORMAL LOW (ref 39–117)
BUN: 37 mg/dL — ABNORMAL HIGH (ref 6–23)
CO2: 23 mEq/L (ref 19–32)
GFR calc Af Amer: 34 mL/min — ABNORMAL LOW (ref >60)
GFR calc non Af Amer: 28 mL/min — ABNORMAL LOW (ref >60)
Glucose, Bld: 99 mg/dL (ref 70–99)
Potassium: 4.3 mEq/L (ref 3.5–5.1)
Total Bilirubin: 0.3 mg/dL (ref 0.3–1.2)
Total Protein: 7 g/dL (ref 6.0–8.3)

## 2011-02-11 LAB — URINALYSIS, ROUTINE W REFLEX MICROSCOPIC
Nitrite: NEGATIVE
Specific Gravity, Urine: 1.013 (ref 1.005–1.030)
Urobilinogen, UA: 0.2 mg/dL (ref 0.0–1.0)

## 2011-02-11 LAB — HEMOGLOBIN A1C: Hgb A1c MFr Bld: 5.8 % — ABNORMAL HIGH (ref <5.7)

## 2011-02-11 LAB — CBC
HCT: 37.5 % — ABNORMAL LOW (ref 39.0–52.0)
MCH: 29 pg (ref 26.0–34.0)
MCV: 86.2 fL (ref 78.0–100.0)
Platelets: 231 10*3/uL (ref 150–400)
RDW: 13.9 % (ref 11.5–15.5)

## 2011-02-11 LAB — CARDIAC PANEL(CRET KIN+CKTOT+MB+TROPI)
Relative Index: 2.7 — ABNORMAL HIGH (ref 0.0–2.5)
Total CK: 142 U/L (ref 7–232)
Troponin I: <0.3 ng/mL (ref <0.30)

## 2011-02-11 LAB — LIPID PANEL
Cholesterol: 198 mg/dL (ref 0–200)
Total CHOL/HDL Ratio: 7.6 RATIO

## 2011-02-11 LAB — URINE MICROSCOPIC-ADD ON

## 2011-02-12 ENCOUNTER — Inpatient Hospital Stay (HOSPITAL_COMMUNITY): Payer: Medicare Other

## 2011-02-12 LAB — BASIC METABOLIC PANEL
Calcium: 9 mg/dL (ref 8.4–10.5)
Creatinine, Ser: 2.38 mg/dL — ABNORMAL HIGH (ref 0.50–1.35)
GFR calc Af Amer: 32 mL/min — ABNORMAL LOW (ref >60)
GFR calc non Af Amer: 26 mL/min — ABNORMAL LOW (ref >60)
Sodium: 139 mEq/L (ref 135–145)

## 2011-02-12 LAB — CBC
MCH: 29.1 pg (ref 26.0–34.0)
MCHC: 33.5 g/dL (ref 30.0–36.0)
MCV: 86.9 fL (ref 78.0–100.0)
Platelets: 225 10*3/uL (ref 150–400)
RBC: 4.5 MIL/uL (ref 4.22–5.81)
RDW: 14 % (ref 11.5–15.5)

## 2011-02-14 ENCOUNTER — Inpatient Hospital Stay (HOSPITAL_COMMUNITY)
Admission: RE | Admit: 2011-02-14 | Discharge: 2011-02-19 | DRG: 945 | Disposition: A | Discharge status: Home Health | Payer: Medicare Other | Source: Other Acute Inpatient Hospital | Attending: Physical Medicine & Rehabilitation | Admitting: Physical Medicine & Rehabilitation

## 2011-02-14 DIAGNOSIS — G20 Parkinson's disease: Secondary | ICD-10-CM | POA: Diagnosis present

## 2011-02-14 DIAGNOSIS — G20A1 Parkinson's disease without dyskinesia, without mention of fluctuations: Secondary | ICD-10-CM | POA: Diagnosis present

## 2011-02-14 DIAGNOSIS — N189 Chronic kidney disease, unspecified: Secondary | ICD-10-CM | POA: Diagnosis present

## 2011-02-14 DIAGNOSIS — I129 Hypertensive chronic kidney disease with stage 1 through stage 4 chronic kidney disease, or unspecified chronic kidney disease: Secondary | ICD-10-CM | POA: Diagnosis present

## 2011-02-14 DIAGNOSIS — I2510 Atherosclerotic heart disease of native coronary artery without angina pectoris: Secondary | ICD-10-CM | POA: Diagnosis present

## 2011-02-14 DIAGNOSIS — Z5189 Encounter for other specified aftercare: Secondary | ICD-10-CM

## 2011-02-14 DIAGNOSIS — E785 Hyperlipidemia, unspecified: Secondary | ICD-10-CM | POA: Diagnosis present

## 2011-02-14 DIAGNOSIS — F411 Generalized anxiety disorder: Secondary | ICD-10-CM | POA: Diagnosis present

## 2011-02-14 DIAGNOSIS — I6330 Cerebral infarction due to thrombosis of unspecified cerebral artery: Secondary | ICD-10-CM

## 2011-02-14 DIAGNOSIS — R29898 Other symptoms and signs involving the musculoskeletal system: Secondary | ICD-10-CM | POA: Diagnosis present

## 2011-02-14 DIAGNOSIS — I6350 Cerebral infarction due to unspecified occlusion or stenosis of unspecified cerebral artery: Secondary | ICD-10-CM | POA: Diagnosis present

## 2011-02-14 DIAGNOSIS — Z8546 Personal history of malignant neoplasm of prostate: Secondary | ICD-10-CM

## 2011-02-14 DIAGNOSIS — R279 Unspecified lack of coordination: Secondary | ICD-10-CM | POA: Diagnosis present

## 2011-02-14 DIAGNOSIS — E559 Vitamin D deficiency, unspecified: Secondary | ICD-10-CM | POA: Diagnosis present

## 2011-02-14 DIAGNOSIS — Z85528 Personal history of other malignant neoplasm of kidney: Secondary | ICD-10-CM

## 2011-02-14 LAB — URINALYSIS, ROUTINE W REFLEX MICROSCOPIC
Glucose, UA: NEGATIVE mg/dL
Ketones, ur: NEGATIVE mg/dL
Leukocytes, UA: NEGATIVE
Nitrite: NEGATIVE
Protein, ur: NEGATIVE mg/dL
pH: 6 (ref 5.0–8.0)

## 2011-02-14 LAB — CBC
Platelets: 208 10*3/uL (ref 150–400)
RBC: 4.06 MIL/uL — ABNORMAL LOW (ref 4.22–5.81)
RDW: 13.9 % (ref 11.5–15.5)
WBC: 9.8 10*3/uL (ref 4.0–10.5)

## 2011-02-14 LAB — BASIC METABOLIC PANEL
CO2: 24 mEq/L (ref 19–32)
Chloride: 105 mEq/L (ref 96–112)
Creatinine, Ser: 2.41 mg/dL — ABNORMAL HIGH (ref 0.50–1.35)
GFR calc Af Amer: 31 mL/min — ABNORMAL LOW (ref >60)
Sodium: 139 mEq/L (ref 135–145)

## 2011-02-14 LAB — MRSA PCR SCREENING: MRSA by PCR: NEGATIVE

## 2011-02-15 DIAGNOSIS — I6330 Cerebral infarction due to thrombosis of unspecified cerebral artery: Secondary | ICD-10-CM

## 2011-02-15 DIAGNOSIS — Z5189 Encounter for other specified aftercare: Secondary | ICD-10-CM

## 2011-02-15 LAB — DIFFERENTIAL
Basophils Absolute: 0.1 10*3/uL (ref 0.0–0.1)
Basophils Relative: 1 % (ref 0–1)
Eosinophils Relative: 9 % — ABNORMAL HIGH (ref 0–5)
Lymphocytes Relative: 14 % (ref 12–46)
Monocytes Absolute: 1 10*3/uL (ref 0.1–1.0)
Neutro Abs: 6.1 10*3/uL (ref 1.7–7.7)

## 2011-02-15 LAB — COMPREHENSIVE METABOLIC PANEL
ALT: 5 U/L (ref 0–53)
AST: 16 U/L (ref 0–37)
Albumin: 3.3 g/dL — ABNORMAL LOW (ref 3.5–5.2)
Alkaline Phosphatase: 27 U/L — ABNORMAL LOW (ref 39–117)
Chloride: 105 mEq/L (ref 96–112)
Potassium: 4.1 mEq/L (ref 3.5–5.1)
Sodium: 140 mEq/L (ref 135–145)
Total Bilirubin: 0.3 mg/dL (ref 0.3–1.2)
Total Protein: 6.4 g/dL (ref 6.0–8.3)

## 2011-02-15 LAB — CBC
MCHC: 33.8 g/dL (ref 30.0–36.0)
Platelets: 220 10*3/uL (ref 150–400)
RDW: 13.8 % (ref 11.5–15.5)
WBC: 9.3 10*3/uL (ref 4.0–10.5)

## 2011-02-15 NOTE — Consult Note (Signed)
NAMEJACEION, Gary Ortega           ACCOUNT NO.:  [id number]  MEDICAL RECORD NO.:  [id number]  LOCATION:  3013                         FACILITY:  [facility]  PHYSICIAN:  Levert Feinstein, MD          DATE OF BIRTH:  1925-06-23  DATE OF CONSULTATION:  02/10/2011 DATE OF DISCHARGE:                                CONSULTATION   CHIEF COMPLAINT:  Stroke.  HISTORY OF PRESENT ILLNESS:  The patient is an 77 year old male with history of prostate cancer, coronary artery disease, chronic renal insufficiency, hyperlipidemia, presented with acute onset of weakness.  He went to bed at baseline a quarter to nine last night, woke up 3 a.m. this morning, using bathroom was at the baseline, however, when he got up at 6:30 a.m., he noticed gait difficulty, veer to his right side, symptom has been persistent since this onset also noticed by his daughter that he has elevated blood pressure up to 200/84.  He denied lateralized motor or sensory deficit.  No nausea or vomiting. No visual change.  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  REVIEW OF SYSTEMS:  Pertinent as above.  PAST MEDICAL HISTORY:  Prostate cancer status post radiation therapy. 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  ALLERGIES:  No known drug allergy.  HOME MEDICATIONS: 1. Aspirin 881 mg Tuesday, Thursday, Saturday, and Sunday. 2. Plavix 75 mg Monday, Wednesday, and Friday. 3. Fenofibrate 134 mg every day. 4. Pravastatin 40 mg at bedtime. 5. Clorazepate 7.5 mg at bedtime. 6. Carbidopa/levodopa 25/50 two per day. 7. Metoprolol 25 mg every day. 8. Vitamin D 5000 units every day. 9. Nitrostat 0.5 mg as needed.  PHYSICAL EXAMINATION:  VITAL SIGNS:  Temperature was 97.4, blood pressure 180/102, pulse was 60, respiration of 23. NEUROLOGIC:  He is awake, alert, oriented to give detailed story.   No dysarthria.  Cranial nerves II through XII.  Pupil equal, round, reactive to light.  Extraocular movements were full.  Facial sensation and strength were normal.  Uvula and tongue midline.  Head turning and shoulder shrugging was normal, symmetric.  Motor examination, normal tone, bulk, and strength of lower extremity, mild right hand tremor. There is slight right heel-to-shin dysmetria.  I did not ask him to stand because he is laid, he went to bed already, and by myself do not feel safe to evaluate his stance and gait.  Deep tendon reflex present and symmetric.  Plantar responses were flexor.  There was no significant sensory loss.  LABORATORY EVALUATION:  BMP was creatinine 2.4.  CBC was normal blood culture.  ASSESSMENT AND PLAN:  An 77 year old with vascular risk factor of hypertension, hyperlipidemia, coronary artery disease, was treated with aspirin and Plavix at home,  now presenting with acute onset of gait difficulty.  MRI confirmed right inferior cerebellum/vermis infarct, with right vertebral artery occlusion. 1. Continue aspirin and Plavix. 2. Physical therapy/occupational therapy,     Levert Feinstein, MD     YY/MEDQ  D:  02/11/2011  T:  02/11/2011  Job:  [id number]  Electronically Signed by Levert Feinstein MD on 02/15/2011 09:00:01 AM

## 2011-02-16 LAB — URINE CULTURE
Colony Count: NO GROWTH
Culture: NO GROWTH

## 2011-02-16 NOTE — H&P (Signed)
NAMEFIELDS, OROS NO.:  [id number]  MEDICAL RECORD NO.:  [id number]  LOCATION:  [facility]                         FACILITY:  [facility]  PHYSICIAN:  Thad Ranger, MD       DATE OF BIRTH:  10/09/1924  DATE OF ADMISSION:  02/10/2011 DATE OF DISCHARGE:                             HISTORY & PHYSICAL   PRIMARY CARE PHYSICIAN:  Michel Harrow, MD  CARDIOLOGIST:  Noralyn Pick. Eden Emms, MD, [facility]  NEPHROLOGIST:  Cecille Aver, M.D.  CHIEF COMPLAINT:  Sudden weakness today.  HISTORY OF PRESENT ILLNESS:  [name] is an 77 year old male with history of prostate cancer, history of coronary artery disease, chronic renal insufficiency, Parkinson disease, and hyperlipidemia presented to the [facility] Emergency Room with chief complaint of sudden weakness today.  History of was obtained from the patient and his daughter at present in the room.  The patient stated that he got up at 06:30 a.m. to use the bathroom and was unable to walk.  The patient states that he did not feel dizzy or lightheadedness or pass out.  He simply could not ambulate at that time due to weakness.  The patient's daughter checked his blood pressure around 11:30 a.m. and was 200/84.  This morning, his blood pressure has been running high.  The patient's daughter gave him a walker to use and she noticed that he was leaning towards the right. Around 3 p.m., as his weakness was not improving, they called the EMS. The patient at that time was also noted to be off balanced and slumped towards his right.  The patient denies any prior history of stroke.  He denied any headache, any nausea or vomiting, any chest pain, or any shortness of breath.  He usually ambulates without any assistance.  PAST MEDICAL HISTORY: 1. History of prostate cancer status post radiotherapy. 2. History of renal cancer status post nephrectomy in February 2012. 3. History of coronary artery disease.  The patient follow with  Dr.     Charlton Haws. 4. History of chronic renal insufficiency, baseline creatinine between     2-2.5.  ALLERGIES:  No known allergies.  MEDICATIONS:  Prior to admission, 1. Plavix 75 mg Monday, Wednesday, and Friday. 2. Aspirin 81 mg Tuesday, Thursday, Saturday and Sunday. 3. Fenofibrate 134 mg p.o. daily. 4. Pravastatin 40 mg p.o. at bedtime. 5. Clorazepate 7.5 mg q.a.m. and at bedtime, 6. Carbidopa and levodopa 250/25 two per day. 7. Metoprolol ER 25 mg p.o. daily. 8. Vitamin including fish oil. 9. Vitamin D 5000 units one daily. 10.Nitrostat 0.4 mg sublingual as needed.  PHYSICAL EXAMINATION:  VITAL SIGNS:  Blood pressure initially at the time of triage was 180/102, pulse rate 60, respiratory rate 23, and temperature 97.4, at the time of my encounter blood pressure 142/59. GENERAL:  The patient is alert, awake, and oriented x3 not in acute distress.  No dysarthria. HEENT:  Anicteric sclerae.  Pink conjunctivae.  Pupils are reactive to light and accommodation.  EOMI. NECK:  Supple.  No lymphopathy.  No JVD. CVS:  S1, S2 clear. CHEST:  Clear to auscultation bilaterally. ABDOMEN: Soft, nontender, nondistended.  Normal bowel sounds. EXTREMITIES:  Trace edema.  No cyanosis  or clubbing. NEUROLOGIC:  Cranial nerves II through XII are intact.  Strength in upper extremities bilaterally 5/5, right lower extremity 4/5, and 5/5 in the left lower extremity.  DIAGNOSTIC DATA:  Sodium 139, potassium 4.4, BUN 40, creatinine 2.4, and calcium 9.1.  CBC; white count 8.2, hemoglobin 13.2, hematocrit 38.0, and platelet counts 203.  RADIOLOGICAL DATA:  CT head without contrast showed atrophia with chronic ischemic changes.  No acute infarct.  A 9 mm interhemispheric calcified meningioma.  IMPRESSION AND PLAN:  Mr. Gary Ortega is an 77 year old male with history of hypertension, hyperlipidemia, Parkinson disease, history of renal cancer status post recent nephrectomy in February 2012, and  history of prostate cancer presents with sudden weakness that started this morning. The patient will be admitted for stroke workup.  Likely precipitated due to malignant hypertension with systolic blood pressure in 200s at home.  1. Right lower extremity weakness likely cerebrovascular accident.     The patient will be admitted to the Neurology floor on tele     monitored bed.  The patient will go to stroke workup protocol.  The     patient will have the MRI and MRA to confirm the acute CVA.  We     will obtain carotid Dopplers and 2-D echocardiogram for further     workup.  The patient was already on aspirin and Plavix, which he     took every other day.  I have discussed with Dr. Thana Farr     from Neurology Service.  The patient will be evaluated by Neurology     Service and the will differ the Neurology for further     recommendations regarding using Plavix only for the stroke     prevention or using Aggrenox. 2. Hypertension, currently blood pressure is somewhat controlled.  I     will place him on labetalol p.r.n. IV with the parameters to avoid     any further progression of ischemic stroke. 3. Hyperlipidemia.  We will obtain fasting lipid panel and continue     pravastatin. 4. History of Parkinson disease.  We will continue carbidopa and     levodopa. 5. Disposition.  The patient will have PT/OT evaluation.  He lives at     home with his wife and was ambulatory before the acute     cerebrovascular accident, please resist before this episode. 6. DVT prophylaxis, Lovenox for DVT prophylaxis.     Thad Ranger, MD     RR/MEDQ  D:  02/10/2011  T:  02/10/2011  Job:  [id number]  cc:   Michel Harrow, MD Cecille Aver, M.D. Noralyn Pick. Eden Emms, MD, [facility]  Electronically Signed by Andres Labrum Mea Ozga  on 02/16/2011 01:01:23 PM

## 2011-02-16 NOTE — Discharge Summary (Signed)
[name], Ortega NO.:  [id number]  MEDICAL RECORD NO.:  [id number]  LOCATION:  3013                         FACILITY:  [facility]  PHYSICIAN:  Thad Ranger, MD       DATE OF BIRTH:  03/31/1925  DATE OF ADMISSION:  02/10/2011 DATE OF DISCHARGE:                              DISCHARGE SUMMARY   PRIMARY CARE PHYSICIAN:  Lucky Cowboy, MD  PRIMARY CARDIOLOGIST:  Noralyn Pick. Eden Emms, MD, [facility]  PRIMARY NEPHROLOGIST:  Cecille Aver, MD  DISCHARGE DIAGNOSES: 1. 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 2. 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 3. Hypertension. 4. Hyperlipidemia. 5. History of Parkinson disease. 6. Gait instability secondary to cerebellar cerebrovascular accident. 7. Chronic kidney disease stage III-IV.  CONSULTATIONS: 1. Neurology Service, Dr. Terrace Arabia and Dr. Pearlean Brownie. 2. Inpatient Rehab.  DISCHARGE MEDICATIONS: 1. Plavix 75 mg p.o. daily. 2. Clorazepate 7.5 mg p.o. b.i.d. 3. Sinemet 25/250 one tablet p.o. b.i.d. 4. Fenofibrate 134 mg p.o. daily at bedtime. 5. Fish oil 1 tablespoon p.o. daily. 6. MiraLax 17 grams p.o. daily as needed for constipation. 7. Multivitamin 1 tablet p.o. daily. 8. Nitroglycerin sublingual 0.4 mg every 5 minutes as needed for chest     pain. 9. Pravastatin 40 mg p.o. daily at bedtime. 10.Vitamin C 500 mg p.o. daily. 11.Vitamin D3 5000 units p.o. daily.  BRIEF HISTORY OF PRESENT ILLNESS:  At the time of admission, Mr. Gary Ortega is an 77 year old male with a history of prostate cancer, history of coronary disease, chronic renal insufficiency, Parkinson disease, presented to the [facility] Emergency Room with sudden weakness on the day of admission.  History was obtained from the patient and his daughter.  The patient stated that he got up at 6:30 a.m. on the day of admission to use the bathroom and was unable to walk.  He did  not feel dizzy or lightheaded or pass out.  He simply could not ambulate at that time due to weakness.  The patient's daughter checked his blood pressure at 11:30 a.m. and was 200/84.  The patient's daughter gave him a walker to use and she noticed that he was leaning towards the right.  Around 3:00 p.m., his weakness was not improving, hence they called the EMS and the patient was brought to the emergency room.  There, CT of the head was done, but did not show acute infarct.  The patient was admitted for rule out CVA.  RADIOLOGICAL DATA:  CT head without contrast February 10, 2011, atrophy and chronic ischemic change, no acute infarct, 9-mm interhemispheric calcified meningioma.  MRI February 10, 2011, acute non hemorrhagic mid-to- inferior right cerebellar and right paracentral vermian infarct.  MRA, occluded right vertebral artery and right PICA, marked narrowing at the junction of left vertebral artery and proximal basilar artery.  Tiny bulge along the superior margin of one segment of the right middle cerebral artery, which represent a region of a vessel or small aneurysm. Repeat MRI was done on February 12, 2011, and the patient complained of new left leg weakness, showed extension of the previously identified right inferior cerebellar PICA distribution to involve  more of the right cerebellar hemisphere and no involvement of the left cerebellum or supratentorial speech.  No hemorrhagic transformation or tonsillar herniation.  A 2-D echocardiogram February 11, 2011, EF of 55%, normal wall motion.  No regional wall motion abnormalities.  Grade 1 diastolic dysfunction.  Carotid Dopplers February 11, 2011, no significant extracranial carotid artery stenosis.  Vertebrals are patent with antegrade flow.  BRIEF HOSPITALIZATION COURSE:  Gary Ortega is an 77 year old male, very functional before this admission, with multiple medical problems including hypertension, hyperlipidemia, Parkinson disease,  history of renal cancer, presented with sudden weakness and was positive for acute cerebellar CVA. 1. Acute right cerebellar CVA.  The patient was admitted to the     Neurology Floor on the monitored bed.  The patient underwent full     stroke workup.  MRI did confirm acute nonhemorrhagic right     cerebellar and right paracentral CVA.  The patient also had     occluded right vertebral artery and right PICA on MRA.  Given that     he was on every other day of both aspirin and Plavix, Neurology     Service was consulted and recommended to continue only on Plavix.     On February 12, 2011, the patient had new complaints of left eye ptosis     with right facial droop and left-sided weakness, hence MRI was     again repeated and showed extension of the previously identified     right inferior cerebellar infarct to involve more of the right     cerebellar hemisphere.  At this point per Neurology recommendation,     it was recommended to keep his blood pressure between 130s-150s.     The Toprol-XL was held and the patient was started on IV fluids.     The patient was evaluated by Physical Therapy multiple times during     the hospitalization and do recommend inpatient rehab versus short-     term rehab.  CIR has been consulted and awaiting approval from     inpatient rehab.  The patient will be placed on saline lock and we     will reassess the BP readings to avoid more extension of the     current stroke or to avoid further ischemia.  I recommended to     restart Toprol or possibly placed on Norvasc 5 mg daily if the     patient's blood pressure is about 150. 2. History of Parkinson's.  The patient was continued on carbidopa and     levodopa. 3. Hyperlipidemia.  The patient was continued on pravastatin and     fenofibric acid.  PHYSICAL EXAMINATION AT THE TIME OF DICTATION:  VITAL SIGNS: Temperature 98.6, pulse 93, blood pressure 153/77, respirations 22, O2 sats 93% on room air. GENERAL:  The  patient is alert, awake, and oriented x3, ambulating in the hallway with a walker and physical therapy assistance. CVS:  S1, S2 clear. CHEST:  Clear to auscultation bilaterally. ABDOMEN:  Soft, nontender, nondistended.  Normal bowel sounds. EXTREMITIES:  No cyanosis, clubbing or edema noted in upper or lower extremities bilaterally.  DISCHARGE FOLLOWUP:  With Dr. Pearlean Brownie in 4 weeks and Dr. Lucky Cowboy and Dr. Kathrene Bongo within 2-3 weeks.  DISCHARGE TIME:  Forty-five minutes.     Thad Ranger, MD     RR/MEDQ  D:  02/14/2011  T:  02/14/2011  Job:  [id number]  cc:   Cecille Aver, M.D. Pramod P. Pearlean Brownie, MD  Lucky Cowboy, M.D. Noralyn Pick. Eden Emms, MD, [facility]  Electronically Signed by Andres Labrum RAI  on 02/16/2011 01:01:35 PM

## 2011-02-17 DIAGNOSIS — I6330 Cerebral infarction due to thrombosis of unspecified cerebral artery: Secondary | ICD-10-CM

## 2011-02-17 DIAGNOSIS — Z5189 Encounter for other specified aftercare: Secondary | ICD-10-CM

## 2011-03-08 NOTE — H&P (Signed)
Gary Ortega, DALGLEISH NO.:  [id number]  MEDICAL RECORD NO.:  [id number]  LOCATION:  4144                         FACILITY:  [facility]  PHYSICIAN:  Ranelle Oyster, M.D.DATE OF BIRTH:  Nov 11, 1924  DATE OF ADMISSION:  02/14/2011 DATE OF DISCHARGE:                             HISTORY & PHYSICAL   CHIEF COMPLAINT:  Left-sided weakness.  PRIMARY CARE PHYSICIAN:  Lucky Cowboy, MD  HISTORY OF PRESENT ILLNESS:  This is a pleasant 77 year old white male with CAD, chronic kidney disease, then on February 10, 2011, gait problems. MRI of the brain showed a nonhemorrhagic infarct in the right cerebellar and right paracentral vermis that included right vertebral artery, right pica with significant narrowing at the junction of the left vertebral artery and basilar artery.  Blood pressure is also elevated on admission at 180/100.  The patient has had fluctuating symptoms, left-sided weakness noted a bit more on February 12, 2011.  MRI of the brain showed extension of the stroke.  Carotid Dopplers showed no ICA stenosis.  A 2- D echo showed ejection fraction of 55%, no other abnormalities. Neurology recommended better blood pressure control and therapies were initiated.  The patient displays significant issues with balance and safety.  Rehab was asked to evaluate the patient, they felt he could benefit from an inpatient rehab stay.  REVIEW OF SYSTEMS:  Notable for weakness, anxiety issues.  His full 12- point review is in the written H and P.  PAST MEDICAL HISTORY:  Positive for: 1. CAD. 2. Diverticulosis. 3. Anal fissure. 4. CKD. 5. Lap nephrectomy for cancer in February 2012. 6. Appendectomy. 7. Prostate cancer with XRT. 8. Dyslipidemia. 9. Parkinsonism with bilateral upper extremity tremors. 10.Excised cataracts, right eye and endophthalmos.  FAMILY HISTORY:  Positive for stroke and questionable cancer.  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  Does not smoke or drink.  Daughter can assist at home at discharge.  ALLERGIES:  LISINOPRIL.  HOME MEDICATIONS:  Vitamin C, fish oil, clorazepate, Toprol, MiraLax, Sinemet, Plavix, fenofibrate, pravastatin, multivitamin, aspirin, vitamin D.  LABORATORY DATA:  Hemoglobin 12, white count 9.8, platelet 208.  Sodium 139, potassium 4.1, BUN 38, creatinine 2.4.  PHYSICAL EXAMINATION:  VITAL SIGNS:  Blood pressure is 153/77, pulse 93, respiratory rate 22, temperature 98.6. GENERAL:  The patient is pleasant, alert. HEENT:  He has left ptosis noted on eye exam.  Ear, nose, and throat is notable for fair dentition.  Pink, moist mucosa. NECK:  Supple without JVD, lymphadenopathy. CHEST:  Clear to auscultation bilaterally without wheezes, rales, or rhonchi. HEART:  Regular rate and rhythm without murmurs, rubs, or gallops. EXTREMITIES:  No clubbing, cyanosis, or edema. ABDOMEN:  Soft, nontender.  Bowel sounds are positive. SKIN:  Generally intact. NEUROLOGICAL:  Notable for cranial nerves II through XII to be normal. Reflexes 1+.  Sensation is grossly intact.  Strength is 4-4+ right upper extremity, 4+ left upper extremity, he is 4/5 right lower extremity, 4+/5 left lower extremity.  Speech is clear.  He has mild right limb ataxia in the upper and lower extremities.  He had a right pronator drift also noted today.  Cognitive is alert, appropriate.  Good insight, memory, attention, etc.  POSTADMISSION PHYSICIAN EVALUATION: 1. Functional deficits secondary to right cerebellar stroke with left     upper extremity weakness and right hemiataxia and loss of fine     motor control.  The patient with also significant balance deficits. 2. The patient is admitted for collaborative interdisciplinary care     between the physiatrist, rehab nursing staff, and therapy team. 3. The patient's level of medical complexity and substantial therapy     needs in  context of that medical necessity cannot be provided at a     lesser intensity of care.  The patient's medical complexity     includes stroke sequelae as well as CAD, anxiety disorder,     hypertension. 4. The patient has experienced substantial functional loss from his     baseline.  Upon functional assessment most recently, he is min     guard assist transfers, ambulating 150 feet, set up upper body care     and min assist lower body care.  The patient has a significant fall     risk.  Premorbidly, he is independent.  Judging by the patient's     diagnosis, physical exam, and functional history, he has potential     for functional progress which will result in measurable gains while     in inpatient rehab.  These gains will be of substantial and     practical use upon discharge to home in facilitating mobility and     self-care. 5. The physiatrist will provide 24-hour management of medical needs as     well as oversight of the therapy plans/treatment and provide     guidance as appropriate regarding interaction of the two.  Medical     problem list and plan are below. 6. The 24-hour rehab nursing team will assist in the management of the     patient's skin care needs as well as education, integration of     therapy concepts, techniques, etc. 7. PT will assess and treat for lower extremity strength, range of     motion, balance, adaptive techniques, equipment, safety awareness,     neuromuscular education with goals modified independent. 8. OT will assess and treat for upper extremity use, ADLs, adaptive     techniques, equipment, functional mobility, safety, upper extremity     strength and coordination with goals modified independent. 9. Case management and social worker will assess and treat for     psychosocial issues and discharge planning. 10.Team conferences will be held weekly to assess progress towards     goals and to determine barriers at discharge. 11.The patient has  demonstrated sufficient medical stability and     exercise capacity to tolerate at least 3 hours therapy per day at     least 5 days per week. 12.Estimated length of stay is 1 week.  Prognosis is good.  MEDICAL PROBLEM LIST AND PLAN: 1. Deep venous thrombosis prophylaxis with subcu Lovenox:  We will     follow platelets.  No active signs of bleeding on examination     today.  2.  Stroke prophylaxis with Plavix 75 mg p.o. daily.     Again, follow for any bleeding sequelae.  Neurologic examination is     stable as of today.  The patient is provided further education     regarding secondary stroke prevention today including blood     pressure and cholesterol control. 2. Dyslipidemia:  Zocor and  fenofibrate. 3. Coronary artery disease:  Toprol-XL and Zocor on board.  The     patient's heart rate under fair control.  Need to watch blood     pressure as noted above, keeping in the 130-150 systolic range. 4. Vitamin D deficiency:  Continue supplementation. 5. Anxiety disorder:  Tranxene b.i.d.  Team will provide ego support.     Maximize sleep cycle to improve daytime wakefulness and cognition. 6. History of Parkinson symptoms/tremors:  Sinemet b.i.d.  Follow up     for safety, coordination.  Encourage use of adaptive equipment as     needed. 7. Hypertension:  See above.  The patient's blood pressure today is     borderline and will bear observations especially with activity,     anxiety, etc.     Ranelle Oyster, M.D.     ZTS/MEDQ  D:  02/14/2011  T:  02/15/2011  Job:  [id number]  cc:   Lucky Cowboy, M.D.  Electronically Signed by Faith Rogue M.D. on 03/08/2011 10:25:21 AM

## 2011-03-09 NOTE — Discharge Summary (Signed)
NAMEDEANO, TOMASZEWSKI NO.:  [id number]  MEDICAL RECORD NO.:  [id number]  LOCATION:  4144                         FACILITY:  [facility]  PHYSICIAN:  Erick Colace, M.D.DATE OF BIRTH:  Nov 16, 1924  DATE OF ADMISSION:  02/14/2011 DATE OF DISCHARGE:  02/19/2011                              DISCHARGE SUMMARY   DISCHARGE DIAGNOSES: 1. Right cerebellar infarct. 2. Systolic hypertension. 3. Chronic kidney disease. 4. Parkinsonism.  HISTORY OF PRESENT ILLNESS:  Mr. Crampton is an 77 year old male with history of coronary artery disease, chronic kidney disease, admitted February 10, 2011, with chief problems.  MRI, MRA of brain showed acute nonhemorrhagic infarction in right cerebella and right paracentral vermis with occluded right vertebral artery and right PICA with significant narrowing in junction of left vertebral artery and basilar artery.  BP was elevated at admission at 180/102.  The patient with fluctuating symptoms with left-sided weakness.  On February 12, 2011, MRI of brain showed extension of stroke.  Carotid Dopplers done showed no ICA stenosis.  A 2-D echo done showed EF of 55%, no wall abnormality.  Neuro recommends keeping BP in 130-150 range to avoid recurrence of symptoms. Therapies were initiated and the patient is currently requiring cues for safety with decreased posture and balance deficits with Berg score 27/56.  The patient was evaluated by Rehab and we felt that he would benefit from inpatient rehab stay.  PAST MEDICAL HISTORY:  Significant for coronary artery disease, diverticulosis, anal fissure, chronic kidney disease, left nephrectomy for cancer in February 2012, appendectomy, prostate cancer treated with XRT, dyslipidemia, question parkinsonism with bilateral upper extremity tremors, excision of cataract right eye with endophthalmitis.  ALLERGIES:  LISINOPRIL.  REVIEW OF SYMPTOMS:  Notable for weakness and anxiety.  FAMILY HISTORY:   Positive for CVA.  SOCIAL HISTORY:  The patient is married, lives in 1-level home with 4 steps at entry.  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  Does not use any tobacco or alcohol.  Family is supportive and assists with wife daily.  FUNCTIONAL HISTORY:  The patient was independent in driving prior to admission.  FUNCTIONAL STATUS:  The patient is min guard assist transfers and min guard assist ambulating 150 feet with rolling walker.  He is setup for upper body care, min assist lower body care.  PHYSICAL EXAMINATION:  VITAL SIGNS:  Blood pressure 153/77, pulse 92, respirations 22, temperature 98.6. GENERAL:  The patient is pleasant male, alert, oriented x3, no acute distress. HEENT: Oral mucosa is pink and moist with fair dentition.  Left ptosis noted.  Hearing intact. NECK:  Supple without JVD or lymphadenopathy. LUNGS:  Clear to auscultation bilaterally without wheezes, rales or rhonchi. HEART:  Shows regular rate rhythm without murmurs, gallops or rubs. ABDOMEN:  Soft, nontender with positive bowel sounds. EXTREMITIES:  Show no evidence of clubbing, cyanosis or edema. SKIN:  Intact. NEUROLOGIC:  Cranial nerves II-XII without deficits.  Reflexes 1+. Sensation grossly intact.  Strength is 4-4+ right upper extremity, 4+ on left upper extremity, 4/5 right lower, 4+/5 left lower extremity. Speech is clear.  The patient with mild limb ataxia in upper and lower extremities.  He has right pronator drift also.  Cognition is alert. The patient with insight, memory and attention.  HOSPITAL COURSE:  Mr. Vinicius Brockman was admitted to rehab on February 14, 2011, for inpatient therapies to consist of PT/OT at least 3 hours 5 days a week.  Past admission physiatrist rehab RN and therapy team have worked together to provide customized collaborative interdisciplinary care.  The patient's blood pressures were checked on b.i.d. basis and he was maintained on Norvasc and Toprol-XL  during this stay.  Currently blood pressures are ranging from 120s-150s systolic, 60s-70s diastolic. Last weight is 74 kg.  Check of lytes this admission showed renal insufficiency to be stable with sodium 139, potassium 4.1, chloride 105, CO2 of 27, BUN 38, creatinine 2.4, glucose 103.  Check of CBC revealed H and H of 12.0 and 35.3, white count 9.8, platelets 208.  The patient has been continent of bowel and bladder. P.O. intake has been good.  During the patient's stay in rehab, team conference was held to monitor the patient's progress, set goals as well as discuss barriers to discharge. At the time of admission, the patient was noted to have decrease gait speed with decreased balance and abnormal movement patterns.  The patient was overall at min assist for transfers with limited gait. Currently, he is progressed to being at independent level for safe household mobility.  He is able to navigate full flight of stairs without loss of balance.  He is unsafe with straight point cane and rolling walker and gait speed, therefore no assisted device recommended. Further followup home health physical therapy to continue past discharge.  OT has worked with the patient on self-care tasks.  At admission, the patient was at min assist supervision level for ADLs. Currently, the patient is at modified independent for all bathing and dressing tasks.  OT has worked with the patient with focus on balance reaction as well as dynamic standing balance.  Further followup occupational therapy to continue past discharge on February 19, 2011.  The patient is discharged to home.  DISCHARGE MEDICATIONS: 1. Vitamin C 500 mg p.o. per day. 2. Vitamin D 5000 units a day. 3. Plavix 75 mg p.o. per day. 4. Multivitamin one per day. 5. Toprol-XL 25 mg a day. 6. Fenofibrate 160 mg p.o. per day. 7. Sinemet 25/250 b.i.d. 8. MiraLax 17 grams per day. 9. Tranxene 7.5 mg p.o. b.i.d. 10.Norvasc 5 mg a day.  DIET:   Regular.  ACTIVITY LEVEL:  Intermittent supervision.  SPECIAL INSTRUCTIONS:  No alcohol, no driving.  Do not use aspirin. Note Plavix is now daily.  [facility] to provide PT and OT.  FOLLOWUP:  The patient to follow up with Dr. Wynn Banker March 19, 2011, at 1:00 p.m. for 1:30 appointment.  Follow up with Dr. Oneta Rack in 2 weeks.  Follow up with Neurology in 4 weeks.     Delle Reining, P.A.   ______________________________ Erick Colace, M.D.    PL/MEDQ  D:  02/18/2011  T:  02/19/2011  Job:  [id number]  cc:   Lucky Cowboy, M.D. Levert Feinstein, MD  Electronically Signed by Osvaldo Shipper. on 02/24/2011 03:13:53 PM Electronically Signed by Claudette Laws M.D. on 03/09/2011 09:46:05 AM

## 2011-03-15 ENCOUNTER — Inpatient Hospital Stay: Payer: Medicare Other | Admitting: Physical Medicine & Rehabilitation

## 2011-03-15 ENCOUNTER — Encounter: Payer: Medicare Other | Attending: Physical Medicine & Rehabilitation

## 2011-03-15 DIAGNOSIS — R29898 Other symptoms and signs involving the musculoskeletal system: Secondary | ICD-10-CM | POA: Insufficient documentation

## 2011-03-15 DIAGNOSIS — I69998 Other sequelae following unspecified cerebrovascular disease: Secondary | ICD-10-CM | POA: Insufficient documentation

## 2011-03-15 DIAGNOSIS — I69993 Ataxia following unspecified cerebrovascular disease: Secondary | ICD-10-CM

## 2011-03-15 DIAGNOSIS — R209 Unspecified disturbances of skin sensation: Secondary | ICD-10-CM | POA: Insufficient documentation

## 2011-03-15 NOTE — Assessment & Plan Note (Signed)
REASON FOR VISIT:  CVA.  An 77 year old male with history of coronary artery disease, chronic kidney disease in mid February 10, 2011, with fluctuating left-sided weakness.  He had MRI and MRA of the brain showed a nonhemorrhagic infarct in right cerebellum and right paracentral vermis, occluded right vertebral artery, right PICA, elevation blood pressure.  Neuro recommended blood pressure control systolic range of 130-150.  Other past history significant for left nephrectomy for cancer, prostate CA, radiation therapy, questionable parkinsonism.  Other complaints include numbness of the right finger and thumb. Questions includes, "can you resume driving."  The patient is able to climb steps.  He is independent with all his ADLs.  He is even helped to get his wife's wheelchair in another car. He has tremor, he has anxiety, he has constipation, easy bleeding since he has been on Plavix.  His other physicians include: 1. Noralyn Pick. Eden Emms, MD, [facility] 2. Cecille Aver, MD, from [facility] 3. Lucky Cowboy, MD primary MD  SOCIAL HISTORY:  Married, lives with his spouse.  PHYSICAL EXAMINATION:  VITAL SIGNS:  Blood pressure 154/77 pulse 61, respirations 18, O2 sat 97% on room air. GENERAL:  No acute distress.  Mood and affect appropriate. MUSCULOSKELETAL:  His extremities are without edema.  He has minimal dysmetria right finger-nose-finger testing.  He does have some resting tremor bilaterally.  His gait shows no evidence to toe drag or knee instability.  He ambulates without an assisted device.  His Romberg is negative.  Motor strength is normal, bilateral upper and lower extremities.  Sensation normal, bilateral upper and lower extremities. Visual fields are intact to confrontation testing.  Extraocular muscles are intact.  IMPRESSION:  Right cerebellar infarct.  He has a minimal residual which is really not impacting him functionally.  PLAN:  I recommended to  resume his driving in his graduated fashion.  He can start out with a license driver with him in a parking lot and then if he does well with this, proceed on to a non-congested street, and then a more congested street, if he does well before going in a car without another driver with him.  He is not to do any nighttime driving or any interstate driving until I see him back in one months' time.  He can continue some outpatient therapy.  He walked a mile x2 the other day with a therapist.  Discussed with patient and daughter, agreeing with plan.     Erick Colace, M.D. Electronically Signed    AEK/MedQ D:  03/15/2011 17:10:02  T:  03/15/2011 21:47:32  Job #:  [id number]  cc:   Noralyn Pick. Eden Emms, MD, [facility] [address]  [address] [place] [address]  Cecille Aver, M.D. Fax: [phone]  Lucky Cowboy, M.D. Fax: [phone]

## 2011-04-12 ENCOUNTER — Ambulatory Visit: Payer: Medicare Other | Admitting: Physical Medicine & Rehabilitation

## 2011-05-04 LAB — COMPREHENSIVE METABOLIC PANEL
ALT: 26
AST: 32
Alkaline Phosphatase: 60
CO2: 33 — ABNORMAL HIGH
Chloride: 98
GFR calc Af Amer: >60
GFR calc non Af Amer: 53 — ABNORMAL LOW
Glucose, Bld: 107 — ABNORMAL HIGH
Potassium: 4.3
Sodium: 136

## 2011-05-04 LAB — CBC
MCHC: 34.2
RBC: 5.33
WBC: 6.4

## 2011-05-04 LAB — PROTIME-INR: Prothrombin Time: 13.1

## 2011-05-26 ENCOUNTER — Encounter: Payer: Self-pay | Admitting: Cardiovascular Disease

## 2011-06-30 ENCOUNTER — Ambulatory Visit (INDEPENDENT_AMBULATORY_CARE_PROVIDER_SITE_OTHER)
Admission: RE | Admit: 2011-06-30 | Discharge: 2011-06-30 | Disposition: A | Discharge status: Home / Self Care | Payer: Medicare Other | Source: Ambulatory Visit | Attending: Cardiovascular Disease | Admitting: Cardiovascular Disease

## 2011-06-30 ENCOUNTER — Encounter: Payer: Self-pay | Admitting: Cardiovascular Disease

## 2011-06-30 ENCOUNTER — Ambulatory Visit (INDEPENDENT_AMBULATORY_CARE_PROVIDER_SITE_OTHER): Payer: Medicare Other | Admitting: Cardiovascular Disease

## 2011-06-30 DIAGNOSIS — R918 Other nonspecific abnormal finding of lung field: Secondary | ICD-10-CM

## 2011-06-30 DIAGNOSIS — G459 Transient cerebral ischemic attack, unspecified: Secondary | ICD-10-CM

## 2011-06-30 DIAGNOSIS — E782 Mixed hyperlipidemia: Secondary | ICD-10-CM

## 2011-06-30 DIAGNOSIS — I2510 Atherosclerotic heart disease of native coronary artery without angina pectoris: Secondary | ICD-10-CM

## 2011-06-30 DIAGNOSIS — I10 Essential (primary) hypertension: Secondary | ICD-10-CM

## 2011-06-30 NOTE — Assessment & Plan Note (Signed)
Improved on amlodipine and diuretic.  Will discuss with renal need to avoid ACE

## 2011-06-30 NOTE — Assessment & Plan Note (Signed)
Cholesterol is at goal.  Continue current dose of statin and diet Rx.  No myalgias or side effects.  F/U  LFT's in 6 months. Lab Results  Component Value Date   LDLCALC 102* 02/11/2011

## 2011-06-30 NOTE — Patient Instructions (Addendum)
Your physician wants you to follow-up in:  6 MONTHS WITH DR Haywood Filler will receive a reminder letter in the mail two months in advance. If you don't receive a letter, please call our office to schedule the follow-up appointment.  Your physician recommends that you continue on your current medications as directed. Please refer to the Current Medication list given to you today. A chest x-ray takes a picture of the organs and structures inside the chest, including the heart, lungs, and blood vessels. This test can show several things, including, whether the heart is enlarges; whether fluid is building up in the lungs; and whether pacemaker / defibrillator leads are still in place. DX ABN LUNG  EXAM

## 2011-06-30 NOTE — Progress Notes (Signed)
F/U CAD. 11/11 moderate 50-60% mid LAD disease on cath by Dr [place]. 2/12 had succesful right nephrectomy without complications ON BB no angina now even when walking up hill. Still emotional about wifes dementia and will likely need to place her in assisted living. They have been married 63 years. Taking ASA and Plavix for previous TIA symptoms. Occasional SSCP but has ot taken nitro. No prolonged episodes and not always exertional so I am not sure SSCP is always angina with only moderate LAD disease.  Has had labile BP and meds being adjusted by Dr Marvel Plan.  Has F/U with Dr Lacy Duverney for kidneys  Recent cerebellar stroke with gait imbalance  On Plavix now  ROS: Denies fever, malais, weight loss, blurry vision, decreased visual acuity, cough, sputum, SOB, hemoptysis, pleuritic pain, palpitaitons, heartburn, abdominal pain, melena, lower extremity edema, claudication, or rash.  All other systems reviewed and negative  General: Affect appropriate Healthy:  appears stated age HEENT: normal Neck supple with no adenopathy JVP normal no bruits no thyromegaly Lungs clear with no wheezing and good diaphragmatic motion Heart:  S1/S2 no murmur,rub, gallop or click PMI normal Abdomen: benighn, BS positve, no tenderness, no AAA S/P lap nephrectomy no bruit.  No HSM or HJR Distal pulses intact with no bruits No edema Neuro non-focal Skin warm and dry No muscular weakness   Current Outpatient Prescriptions  Medication Sig Dispense Refill  . amLODipine (NORVASC) 5 MG tablet Take 5 mg by mouth daily.        Marland Kitchen aspirin (ASPIR-81) 81 MG EC tablet Take 81 mg by mouth. 1 tab tues, thurs, sat, Sunday        . carbidopa-levodopa (SINEMET) 25-250 MG per tablet Take 1 tablet by mouth 2 (two) times daily. Take 2 tablets       . Cholecalciferol (VITAMIN D3) 5000 UNITS CAPS Take by mouth daily.        . citalopram (CELEXA) 20 MG tablet Take 20 mg by mouth daily.        . clopidogrel (PLAVIX) 75 MG tablet Take 75  mg by mouth daily. 1 tab mon, wed, fri       . clorazepate (TRANXENE) 7.5 MG tablet Take 7.5 mg by mouth 2 (two) times daily.        . fenofibrate micronized (LOFIBRA) 134 MG capsule Take 134 mg by mouth daily.        . Fish Oil OIL Take by mouth daily. 1 tablespoon       . furosemide (LASIX) 20 MG tablet Take 20 mg by mouth daily.        . metoprolol (LOPRESSOR) 50 MG tablet Take 50 mg by mouth daily.        . multivitamin (THERAGRAN) per tablet Take 1 tablet by mouth daily.        . nitroGLYCERIN (NITROSTAT) 0.4 MG SL tablet Place 0.4 mg under the tongue. One tablet under tongue at onset of chest pain; may repeat every 5 minutes for up to 3 doses       . pravastatin (PRAVACHOL) 40 MG tablet Take 40 mg by mouth daily.          Allergies  Lisinopril  Electrocardiogram:  Assessment and Plan

## 2011-06-30 NOTE — Assessment & Plan Note (Signed)
Stable with no angina and good activity level.  Continue medical Rx  

## 2011-06-30 NOTE — Assessment & Plan Note (Signed)
No evidence of PAF or cardioembolic source.  Continue ASA and Plavix

## 2011-08-04 DIAGNOSIS — I10 Essential (primary) hypertension: Secondary | ICD-10-CM | POA: Diagnosis not present

## 2011-09-27 DIAGNOSIS — R7309 Other abnormal glucose: Secondary | ICD-10-CM | POA: Diagnosis not present

## 2011-09-27 DIAGNOSIS — Z79899 Other long term (current) drug therapy: Secondary | ICD-10-CM | POA: Diagnosis not present

## 2011-09-27 DIAGNOSIS — E782 Mixed hyperlipidemia: Secondary | ICD-10-CM | POA: Diagnosis not present

## 2011-09-27 DIAGNOSIS — E559 Vitamin D deficiency, unspecified: Secondary | ICD-10-CM | POA: Diagnosis not present

## 2011-09-27 DIAGNOSIS — I10 Essential (primary) hypertension: Secondary | ICD-10-CM | POA: Diagnosis not present

## 2011-10-06 DIAGNOSIS — Z8546 Personal history of malignant neoplasm of prostate: Secondary | ICD-10-CM | POA: Diagnosis not present

## 2011-10-13 DIAGNOSIS — Z8546 Personal history of malignant neoplasm of prostate: Secondary | ICD-10-CM | POA: Diagnosis not present

## 2011-10-13 DIAGNOSIS — N200 Calculus of kidney: Secondary | ICD-10-CM | POA: Diagnosis not present

## 2011-12-29 DIAGNOSIS — I129 Hypertensive chronic kidney disease with stage 1 through stage 4 chronic kidney disease, or unspecified chronic kidney disease: Secondary | ICD-10-CM | POA: Diagnosis not present

## 2011-12-29 DIAGNOSIS — Q602 Renal agenesis, unspecified: Secondary | ICD-10-CM | POA: Diagnosis not present

## 2011-12-29 DIAGNOSIS — N184 Chronic kidney disease, stage 4 (severe): Secondary | ICD-10-CM | POA: Diagnosis not present

## 2012-01-03 DIAGNOSIS — R7309 Other abnormal glucose: Secondary | ICD-10-CM | POA: Diagnosis not present

## 2012-01-03 DIAGNOSIS — I10 Essential (primary) hypertension: Secondary | ICD-10-CM | POA: Diagnosis not present

## 2012-01-03 DIAGNOSIS — E559 Vitamin D deficiency, unspecified: Secondary | ICD-10-CM | POA: Diagnosis not present

## 2012-01-03 DIAGNOSIS — E782 Mixed hyperlipidemia: Secondary | ICD-10-CM | POA: Diagnosis not present

## 2012-01-03 DIAGNOSIS — Z79899 Other long term (current) drug therapy: Secondary | ICD-10-CM | POA: Diagnosis not present

## 2012-01-17 ENCOUNTER — Telehealth: Payer: Self-pay | Admitting: Cardiovascular Disease

## 2012-01-17 NOTE — Telephone Encounter (Signed)
New Problem:    I tried calling the home phone number [phone]) listed for the patient and after several attemps finally reached someone who informed me that I had the wrong number.  I called the emergency contact Casey Burkitt listed patient and was unable to reach them. I left a message on their voicemail with my name, the reason I called, the name of his physician, and a number to call back to schedule their father's appointment.

## 2012-03-21 DIAGNOSIS — E782 Mixed hyperlipidemia: Secondary | ICD-10-CM | POA: Diagnosis not present

## 2012-03-21 DIAGNOSIS — E559 Vitamin D deficiency, unspecified: Secondary | ICD-10-CM | POA: Diagnosis not present

## 2012-03-21 DIAGNOSIS — C61 Malignant neoplasm of prostate: Secondary | ICD-10-CM | POA: Diagnosis not present

## 2012-03-21 DIAGNOSIS — Z1212 Encounter for screening for malignant neoplasm of rectum: Secondary | ICD-10-CM | POA: Diagnosis not present

## 2012-03-21 DIAGNOSIS — R7309 Other abnormal glucose: Secondary | ICD-10-CM | POA: Diagnosis not present

## 2012-03-21 DIAGNOSIS — I10 Essential (primary) hypertension: Secondary | ICD-10-CM | POA: Diagnosis not present

## 2012-03-21 DIAGNOSIS — Z79899 Other long term (current) drug therapy: Secondary | ICD-10-CM | POA: Diagnosis not present

## 2012-04-20 DIAGNOSIS — Z23 Encounter for immunization: Secondary | ICD-10-CM | POA: Diagnosis not present

## 2012-05-07 DIAGNOSIS — D239 Other benign neoplasm of skin, unspecified: Secondary | ICD-10-CM | POA: Diagnosis not present

## 2012-05-07 DIAGNOSIS — D1801 Hemangioma of skin and subcutaneous tissue: Secondary | ICD-10-CM | POA: Diagnosis not present

## 2012-05-07 DIAGNOSIS — L905 Scar conditions and fibrosis of skin: Secondary | ICD-10-CM | POA: Diagnosis not present

## 2012-05-07 DIAGNOSIS — Z0189 Encounter for other specified special examinations: Secondary | ICD-10-CM | POA: Diagnosis not present

## 2012-05-07 DIAGNOSIS — L570 Actinic keratosis: Secondary | ICD-10-CM | POA: Diagnosis not present

## 2012-06-28 DIAGNOSIS — E559 Vitamin D deficiency, unspecified: Secondary | ICD-10-CM | POA: Diagnosis not present

## 2012-06-28 DIAGNOSIS — I10 Essential (primary) hypertension: Secondary | ICD-10-CM | POA: Diagnosis not present

## 2012-06-28 DIAGNOSIS — R7309 Other abnormal glucose: Secondary | ICD-10-CM | POA: Diagnosis not present

## 2012-06-28 DIAGNOSIS — Z79899 Other long term (current) drug therapy: Secondary | ICD-10-CM | POA: Diagnosis not present

## 2012-06-28 DIAGNOSIS — E782 Mixed hyperlipidemia: Secondary | ICD-10-CM | POA: Diagnosis not present

## 2012-07-03 DIAGNOSIS — I129 Hypertensive chronic kidney disease with stage 1 through stage 4 chronic kidney disease, or unspecified chronic kidney disease: Secondary | ICD-10-CM | POA: Diagnosis not present

## 2012-07-03 DIAGNOSIS — Q605 Renal hypoplasia, unspecified: Secondary | ICD-10-CM | POA: Diagnosis not present

## 2012-07-03 DIAGNOSIS — Q602 Renal agenesis, unspecified: Secondary | ICD-10-CM | POA: Diagnosis not present

## 2012-07-03 DIAGNOSIS — N184 Chronic kidney disease, stage 4 (severe): Secondary | ICD-10-CM | POA: Diagnosis not present

## 2012-09-27 DIAGNOSIS — Z79899 Other long term (current) drug therapy: Secondary | ICD-10-CM | POA: Diagnosis not present

## 2012-09-27 DIAGNOSIS — R7309 Other abnormal glucose: Secondary | ICD-10-CM | POA: Diagnosis not present

## 2012-09-27 DIAGNOSIS — E559 Vitamin D deficiency, unspecified: Secondary | ICD-10-CM | POA: Diagnosis not present

## 2012-09-27 DIAGNOSIS — I10 Essential (primary) hypertension: Secondary | ICD-10-CM | POA: Diagnosis not present

## 2012-09-27 DIAGNOSIS — E782 Mixed hyperlipidemia: Secondary | ICD-10-CM | POA: Diagnosis not present

## 2012-11-01 DIAGNOSIS — N318 Other neuromuscular dysfunction of bladder: Secondary | ICD-10-CM | POA: Diagnosis not present

## 2012-11-01 DIAGNOSIS — N200 Calculus of kidney: Secondary | ICD-10-CM | POA: Diagnosis not present

## 2012-11-01 DIAGNOSIS — Z8546 Personal history of malignant neoplasm of prostate: Secondary | ICD-10-CM | POA: Diagnosis not present

## 2012-12-27 DIAGNOSIS — I10 Essential (primary) hypertension: Secondary | ICD-10-CM | POA: Diagnosis not present

## 2012-12-27 DIAGNOSIS — E782 Mixed hyperlipidemia: Secondary | ICD-10-CM | POA: Diagnosis not present

## 2012-12-27 DIAGNOSIS — E559 Vitamin D deficiency, unspecified: Secondary | ICD-10-CM | POA: Diagnosis not present

## 2012-12-27 DIAGNOSIS — R7309 Other abnormal glucose: Secondary | ICD-10-CM | POA: Diagnosis not present

## 2012-12-27 DIAGNOSIS — M25569 Pain in unspecified knee: Secondary | ICD-10-CM | POA: Diagnosis not present

## 2012-12-27 DIAGNOSIS — Z79899 Other long term (current) drug therapy: Secondary | ICD-10-CM | POA: Diagnosis not present

## 2013-04-04 DIAGNOSIS — R7309 Other abnormal glucose: Secondary | ICD-10-CM | POA: Diagnosis not present

## 2013-04-04 DIAGNOSIS — Z79899 Other long term (current) drug therapy: Secondary | ICD-10-CM | POA: Diagnosis not present

## 2013-04-04 DIAGNOSIS — E559 Vitamin D deficiency, unspecified: Secondary | ICD-10-CM | POA: Diagnosis not present

## 2013-04-04 DIAGNOSIS — Z23 Encounter for immunization: Secondary | ICD-10-CM | POA: Diagnosis not present

## 2013-04-04 DIAGNOSIS — E782 Mixed hyperlipidemia: Secondary | ICD-10-CM | POA: Diagnosis not present

## 2013-04-04 DIAGNOSIS — I10 Essential (primary) hypertension: Secondary | ICD-10-CM | POA: Diagnosis not present

## 2013-05-24 DIAGNOSIS — I10 Essential (primary) hypertension: Secondary | ICD-10-CM | POA: Diagnosis not present

## 2013-05-24 DIAGNOSIS — F341 Dysthymic disorder: Secondary | ICD-10-CM | POA: Diagnosis not present

## 2013-07-02 DIAGNOSIS — R269 Unspecified abnormalities of gait and mobility: Secondary | ICD-10-CM | POA: Diagnosis not present

## 2013-07-02 DIAGNOSIS — I129 Hypertensive chronic kidney disease with stage 1 through stage 4 chronic kidney disease, or unspecified chronic kidney disease: Secondary | ICD-10-CM | POA: Diagnosis not present

## 2013-07-28 ENCOUNTER — Other Ambulatory Visit: Payer: Self-pay | Admitting: Internal Medicine

## 2013-07-31 ENCOUNTER — Telehealth: Payer: Self-pay | Admitting: *Deleted

## 2013-07-31 DIAGNOSIS — H02039 Senile entropion of unspecified eye, unspecified eyelid: Secondary | ICD-10-CM | POA: Diagnosis not present

## 2013-07-31 NOTE — Telephone Encounter (Signed)
Spoke with daughter.  Patient having eye surgery due to muscle loss in right eyelid by Dr Tobe Sos in [place].  Patient takes Plavix and per Dr Herschel Sen[place] to stop Plavix 2 days before surgery and restart the day after surgery.  Daughter aware.

## 2013-08-05 DIAGNOSIS — H02039 Senile entropion of unspecified eye, unspecified eyelid: Secondary | ICD-10-CM | POA: Diagnosis not present

## 2013-08-06 ENCOUNTER — Other Ambulatory Visit: Payer: Self-pay | Admitting: Internal Medicine

## 2013-08-30 ENCOUNTER — Ambulatory Visit: Payer: Self-pay | Admitting: Physician Assistant

## 2013-08-31 ENCOUNTER — Encounter: Payer: Self-pay | Admitting: *Deleted

## 2013-08-31 DIAGNOSIS — N138 Other obstructive and reflux uropathy: Secondary | ICD-10-CM | POA: Insufficient documentation

## 2013-08-31 DIAGNOSIS — F419 Anxiety disorder, unspecified: Secondary | ICD-10-CM

## 2013-08-31 DIAGNOSIS — E785 Hyperlipidemia, unspecified: Secondary | ICD-10-CM

## 2013-08-31 DIAGNOSIS — F32A Depression, unspecified: Secondary | ICD-10-CM

## 2013-08-31 DIAGNOSIS — F329 Major depressive disorder, single episode, unspecified: Secondary | ICD-10-CM

## 2013-08-31 DIAGNOSIS — N401 Enlarged prostate with lower urinary tract symptoms: Secondary | ICD-10-CM

## 2013-08-31 DIAGNOSIS — I10 Essential (primary) hypertension: Secondary | ICD-10-CM

## 2013-09-05 ENCOUNTER — Encounter: Payer: Self-pay | Admitting: Emergency Medicine

## 2013-09-05 ENCOUNTER — Ambulatory Visit (INDEPENDENT_AMBULATORY_CARE_PROVIDER_SITE_OTHER): Payer: Medicare Other | Admitting: Emergency Medicine

## 2013-09-05 VITALS — BP 122/58 | HR 62 | Temp 98.0°F | Resp 16 | Ht 67.0 in | Wt 158.0 lb

## 2013-09-05 DIAGNOSIS — I10 Essential (primary) hypertension: Secondary | ICD-10-CM | POA: Diagnosis not present

## 2013-09-05 DIAGNOSIS — R7309 Other abnormal glucose: Secondary | ICD-10-CM

## 2013-09-05 DIAGNOSIS — E782 Mixed hyperlipidemia: Secondary | ICD-10-CM

## 2013-09-05 DIAGNOSIS — R531 Weakness: Secondary | ICD-10-CM

## 2013-09-05 DIAGNOSIS — R5383 Other fatigue: Secondary | ICD-10-CM

## 2013-09-05 DIAGNOSIS — R5381 Other malaise: Secondary | ICD-10-CM

## 2013-09-05 NOTE — Progress Notes (Signed)
Subjective:    Patient ID: Gary Ortega, male    DOB: 1925/04/04, 80 y.o.   MRN: [id number]  HPI Comments: 80 yo pleasant male presents for 3 month F/U for HTN, Cholesterol, Pre-Dm, D. Deficient. He is keeping busy with walking, weather permitting and house chores. He eats descent but notes fast food daily but better choices such as grilled vs fried. He notes BP will occasionally go up but for most part is WNL. Daughter notes he has improved alertness, energy, and decreased falls with decreased Citalopram and D/C Tranxene. They will d/c Celexa Saturday and may try to wean off Sinemet next.  LAST LAB BUN 42 CREAT 2.3 T 140 TG 121 H 35 L 81 A1C 6.0 INSULIN 9 D 82 bp 110-30/ 80S He overall notes he is feeling much better and less stress with improved depression and decreased medications.  Current Outpatient Prescriptions on File Prior to Visit  Medication Sig Dispense Refill  . amLODipine (NORVASC) 5 MG tablet TAKE ONE TABLET BY MOUTH AT BEDTIME FOR BLOOD PRESSURE  90 tablet  0  . carbidopa-levodopa (SINEMET) 25-250 MG per tablet Take 1 tablet by mouth 2 (two) times daily. Take 2 tablets       . Cholecalciferol (VITAMIN D3) 5000 UNITS CAPS Take by mouth daily.        . clopidogrel (PLAVIX) 75 MG tablet Take 75 mg by mouth daily. 1 tab mon, wed, fri       . fenofibrate micronized (LOFIBRA) 134 MG capsule Take 134 mg by mouth daily.        . Fish Oil OIL Take by mouth daily. 1 tablespoon       . multivitamin (THERAGRAN) per tablet Take 1 tablet by mouth daily.        . nitroGLYCERIN (NITROSTAT) 0.4 MG SL tablet Place 0.4 mg under the tongue. One tablet under tongue at onset of chest pain; may repeat every 5 minutes for up to 3 doses       . pravastatin (PRAVACHOL) 40 MG tablet Take 40 mg by mouth daily.         No current facility-administered medications on file prior to visit.   Allergies  Allergen Reactions  . Ace Inhibitors   . Lisinopril     Affects kidney  . Nsaids   . Wellbutrin  [Bupropion]     confusion   Past Medical History  Diagnosis Date  . Hypertension   . Fatigue   . Shortness of breath   . Chest pain   . Vitamin D deficiency     unspec.  Marland Kitchen Anxiety   . Depressed   . Hyperlipidemia   . BPH (benign prostatic hypertrophy) with urinary obstruction   . History of kidney stones   . H/O hemorrhoids        Review of Systems  Constitutional: Positive for fatigue.  Neurological: Positive for tremors and weakness.       Mild symptoms currently   All other systems reviewed and are negative.   BP 122/58  Pulse 62  Temp(Src) 98 F (36.7 C) (Temporal)  Resp 16  Ht 5\' 7"  (1.702 m)  Wt 158 lb (71.668 kg)  BMI 24.74 kg/m2      Objective:   Physical Exam  Nursing note and vitals reviewed. Constitutional: He is oriented to person, place, and time. He appears well-developed and well-nourished.  HENT:  Head: Normocephalic and atraumatic.  Right Ear: External ear normal.  Left Ear: External ear normal.  Nose: Nose normal.  Eyes: Conjunctivae and EOM are normal.  Neck: Normal range of motion. Neck supple. No JVD present. No thyromegaly present.  Cardiovascular: Normal rate, regular rhythm, normal heart sounds and intact distal pulses.   Pulmonary/Chest: Effort normal and breath sounds normal.  Abdominal: Soft. Bowel sounds are normal. He exhibits no distension and no mass. There is no tenderness. There is no rebound and no guarding.  Musculoskeletal: Normal range of motion. He exhibits no edema and no tenderness.  Lymphadenopathy:    He has no cervical adenopathy.  Neurological: He is alert and oriented to person, place, and time. He has normal reflexes. No cranial nerve deficit. Coordination normal.  Skin: Skin is warm and dry.  Psychiatric: He has a normal mood and affect. His behavior is normal. Judgment and thought content normal.          Assessment & Plan:  1.  3 month F/U for HTN, Cholesterol, Pre-Dm, D. Deficient. Needs healthy diet,  cardio QD and obtain healthy weight. Check Labs, Check BP if >130/80 call office 2. Weakness/ Fatigue- check labs, increase activity and H2O. Continue to slowly decrease Sinemet to see if tremors do not increase and weakness improves. w/c if SX increase

## 2013-09-05 NOTE — Patient Instructions (Signed)
Fat and Cholesterol Control Diet  Fat and cholesterol levels in your blood and organs are influenced by your diet. High levels of fat and cholesterol may lead to diseases of the heart, small and large blood vessels, gallbladder, liver, and pancreas.  CONTROLLING FAT AND CHOLESTEROL WITH DIET  Although exercise and lifestyle factors are important, your diet is key. That is because certain foods are known to raise cholesterol and others to lower it. The goal is to balance foods for their effect on cholesterol and more importantly, to replace saturated and trans fat with other types of fat, such as monounsaturated fat, polyunsaturated fat, and omega-3 fatty acids.  On average, a person should consume no more than 15 to 17 g of saturated fat daily. Saturated and trans fats are considered "bad" fats, and they will raise LDL cholesterol. Saturated fats are primarily found in animal products such as meats, butter, and cream. However, that does not mean you need to give up all your favorite foods. Today, there are good tasting, low-fat, low-cholesterol substitutes for most of the things you like to eat. Choose low-fat or nonfat alternatives. Choose round or loin cuts of red meat. These types of cuts are lowest in fat and cholesterol. Chicken (without the skin), fish, veal, and ground turkey breast are great choices. Eliminate fatty meats, such as hot dogs and salami. Even shellfish have little or no saturated fat. Have a 3 oz (85 g) portion when you eat lean meat, poultry, or fish.  Trans fats are also called "partially hydrogenated oils." They are oils that have been scientifically manipulated so that they are solid at room temperature resulting in a longer shelf life and improved taste and texture of foods in which they are added. Trans fats are found in stick margarine, some tub margarines, cookies, crackers, and baked goods.   When baking and cooking, oils are a great substitute for butter. The monounsaturated oils are  especially beneficial since it is believed they lower LDL and raise HDL. The oils you should avoid entirely are saturated tropical oils, such as coconut and palm.   Remember to eat a lot from food groups that are naturally free of saturated and trans fat, including fish, fruit, vegetables, beans, grains (barley, rice, couscous, bulgur wheat), and pasta (without cream sauces).   IDENTIFYING FOODS THAT LOWER FAT AND CHOLESTEROL   Soluble fiber may lower your cholesterol. This type of fiber is found in fruits such as apples, vegetables such as broccoli, potatoes, and carrots, legumes such as beans, peas, and lentils, and grains such as barley. Foods fortified with plant sterols (phytosterol) may also lower cholesterol. You should eat at least 2 g per day of these foods for a cholesterol lowering effect.   Read package labels to identify low-saturated fats, trans fat free, and low-fat foods at the supermarket. Select cheeses that have only 2 to 3 g saturated fat per ounce. Use a heart-healthy tub margarine that is free of trans fats or partially hydrogenated oil. When buying baked goods (cookies, crackers), avoid partially hydrogenated oils. Breads and muffins should be made from whole grains (whole-wheat or whole oat flour, instead of "flour" or "enriched flour"). Buy non-creamy canned soups with reduced salt and no added fats.   FOOD PREPARATION TECHNIQUES   Never deep-fry. If you must fry, either stir-fry, which uses very little fat, or use non-stick cooking sprays. When possible, broil, bake, or roast meats, and steam vegetables. Instead of putting butter or margarine on vegetables, use lemon   and herbs, applesauce, and cinnamon (for squash and sweet potatoes). Use nonfat yogurt, salsa, and low-fat dressings for salads.   LOW-SATURATED FAT / LOW-FAT FOOD SUBSTITUTES  Meats / Saturated Fat (g)  · Avoid: Steak, marbled (3 oz/85 g) / 11 g  · Choose: Steak, lean (3 oz/85 g) / 4 g  · Avoid: Hamburger (3 oz/85 g) / 7  g  · Choose: Hamburger, lean (3 oz/85 g) / 5 g  · Avoid: Ham (3 oz/85 g) / 6 g  · Choose: Ham, lean cut (3 oz/85 g) / 2.4 g  · Avoid: Chicken, with skin, dark meat (3 oz/85 g) / 4 g  · Choose: Chicken, skin removed, dark meat (3 oz/85 g) / 2 g  · Avoid: Chicken, with skin, light meat (3 oz/85 g) / 2.5 g  · Choose: Chicken, skin removed, light meat (3 oz/85 g) / 1 g  Dairy / Saturated Fat (g)  · Avoid: Whole milk (1 cup) / 5 g  · Choose: Low-fat milk, 2% (1 cup) / 3 g  · Choose: Low-fat milk, 1% (1 cup) / 1.5 g  · Choose: Skim milk (1 cup) / 0.3 g  · Avoid: Hard cheese (1 oz/28 g) / 6 g  · Choose: Skim milk cheese (1 oz/28 g) / 2 to 3 g  · Avoid: Cottage cheese, 4% fat (1 cup) / 6.5 g  · Choose: Low-fat cottage cheese, 1% fat (1 cup) / 1.5 g  · Avoid: Ice cream (1 cup) / 9 g  · Choose: Sherbet (1 cup) / 2.5 g  · Choose: Nonfat frozen yogurt (1 cup) / 0.3 g  · Choose: Frozen fruit bar / trace  · Avoid: Whipped cream (1 tbs) / 3.5 g  · Choose: Nondairy whipped topping (1 tbs) / 1 g  Condiments / Saturated Fat (g)  · Avoid: Mayonnaise (1 tbs) / 2 g  · Choose: Low-fat mayonnaise (1 tbs) / 1 g  · Avoid: Butter (1 tbs) / 7 g  · Choose: Extra light margarine (1 tbs) / 1 g  · Avoid: Coconut oil (1 tbs) / 11.8 g  · Choose: Olive oil (1 tbs) / 1.8 g  · Choose: Corn oil (1 tbs) / 1.7 g  · Choose: Safflower oil (1 tbs) / 1.2 g  · Choose: Sunflower oil (1 tbs) / 1.4 g  · Choose: Soybean oil (1 tbs) / 2.4 g  · Choose: Canola oil (1 tbs) / 1 g  Document Released: 07/11/2005 Document Revised: 11/05/2012 Document Reviewed: 12/30/2010  [employer]® Patient Information ©2014 [employer].

## 2013-09-06 LAB — LIPID PANEL
Cholesterol: 160 mg/dL (ref 0–200)
HDL: 31 mg/dL — AB (ref >39)
LDL CALC: 98 mg/dL (ref 0–99)
TRIGLYCERIDES: 155 mg/dL — AB (ref <150)
Total CHOL/HDL Ratio: 5.2 Ratio
VLDL: 31 mg/dL (ref 0–40)

## 2013-09-06 LAB — CBC WITH DIFFERENTIAL/PLATELET
Basophils Absolute: 0.1 10*3/uL (ref 0.0–0.1)
Basophils Relative: 1 % (ref 0–1)
EOS ABS: 0.3 10*3/uL (ref 0.0–0.7)
EOS PCT: 4 % (ref 0–5)
HEMATOCRIT: 41.5 % (ref 39.0–52.0)
HEMOGLOBIN: 14 g/dL (ref 13.0–17.0)
LYMPHS ABS: 1.2 10*3/uL (ref 0.7–4.0)
Lymphocytes Relative: 16 % (ref 12–46)
MCH: 29.2 pg (ref 26.0–34.0)
MCHC: 33.7 g/dL (ref 30.0–36.0)
MCV: 86.5 fL (ref 78.0–100.0)
MONO ABS: 0.7 10*3/uL (ref 0.1–1.0)
MONOS PCT: 10 % (ref 3–12)
Neutro Abs: 5.2 10*3/uL (ref 1.7–7.7)
Neutrophils Relative %: 69 % (ref 43–77)
Platelets: 279 10*3/uL (ref 150–400)
RBC: 4.8 MIL/uL (ref 4.22–5.81)
RDW: 14.1 % (ref 11.5–15.5)
WBC: 7.6 10*3/uL (ref 4.0–10.5)

## 2013-09-06 LAB — HEPATIC FUNCTION PANEL
ALT: <8 U/L (ref 0–53)
AST: 21 U/L (ref 0–37)
Albumin: 4.7 g/dL (ref 3.5–5.2)
Alkaline Phosphatase: 26 U/L — ABNORMAL LOW (ref 39–117)
BILIRUBIN INDIRECT: 0.5 mg/dL (ref 0.2–1.2)
Bilirubin, Direct: 0.1 mg/dL (ref 0.0–0.3)
TOTAL PROTEIN: 7.2 g/dL (ref 6.0–8.3)
Total Bilirubin: 0.6 mg/dL (ref 0.2–1.2)

## 2013-09-06 LAB — BASIC METABOLIC PANEL WITH GFR
BUN: 39 mg/dL — ABNORMAL HIGH (ref 6–23)
CALCIUM: 9.8 mg/dL (ref 8.4–10.5)
CO2: 27 meq/L (ref 19–32)
CREATININE: 2.31 mg/dL — AB (ref 0.50–1.35)
Chloride: 102 mEq/L (ref 96–112)
GFR, Est African American: 29 mL/min — ABNORMAL LOW
GFR, Est Non African American: 25 mL/min — ABNORMAL LOW
GLUCOSE: 86 mg/dL (ref 70–99)
Potassium: 4.8 mEq/L (ref 3.5–5.3)
Sodium: 139 mEq/L (ref 135–145)

## 2013-09-06 LAB — INSULIN, FASTING: INSULIN FASTING, SERUM: 10 u[IU]/mL (ref 3–28)

## 2013-09-06 LAB — HEMOGLOBIN A1C
Hgb A1c MFr Bld: 5.7 % — ABNORMAL HIGH (ref <5.7)
Mean Plasma Glucose: 117 mg/dL — ABNORMAL HIGH (ref <117)

## 2013-09-29 ENCOUNTER — Other Ambulatory Visit: Payer: Self-pay | Admitting: Internal Medicine

## 2013-10-04 ENCOUNTER — Other Ambulatory Visit: Payer: Self-pay | Admitting: *Deleted

## 2013-10-04 MED ORDER — CLOPIDOGREL BISULFATE 75 MG PO TABS: 75.0000 mg | ORAL_TABLET | Freq: Once | ORAL | Status: DC

## 2013-10-08 ENCOUNTER — Ambulatory Visit (INDEPENDENT_AMBULATORY_CARE_PROVIDER_SITE_OTHER): Payer: Medicare Other | Admitting: Emergency Medicine

## 2013-10-08 ENCOUNTER — Encounter: Payer: Self-pay | Admitting: Emergency Medicine

## 2013-10-08 ENCOUNTER — Ambulatory Visit
Admission: RE | Admit: 2013-10-08 | Discharge: 2013-10-08 | Disposition: A | Discharge status: Home / Self Care | Payer: Medicare Other | Source: Ambulatory Visit | Attending: Emergency Medicine | Admitting: Emergency Medicine

## 2013-10-08 ENCOUNTER — Other Ambulatory Visit: Payer: Self-pay | Admitting: Emergency Medicine

## 2013-10-08 VITALS — BP 114/62 | HR 68 | Temp 98.4°F | Resp 16 | Ht 67.0 in | Wt 161.0 lb

## 2013-10-08 DIAGNOSIS — R109 Unspecified abdominal pain: Secondary | ICD-10-CM | POA: Diagnosis not present

## 2013-10-08 DIAGNOSIS — R52 Pain, unspecified: Secondary | ICD-10-CM

## 2013-10-08 DIAGNOSIS — Z0000 Encounter for general adult medical examination without abnormal findings: Secondary | ICD-10-CM

## 2013-10-08 DIAGNOSIS — R079 Chest pain, unspecified: Secondary | ICD-10-CM | POA: Diagnosis not present

## 2013-10-08 DIAGNOSIS — R6889 Other general symptoms and signs: Secondary | ICD-10-CM

## 2013-10-08 DIAGNOSIS — F419 Anxiety disorder, unspecified: Secondary | ICD-10-CM

## 2013-10-08 DIAGNOSIS — Z23 Encounter for immunization: Secondary | ICD-10-CM | POA: Diagnosis not present

## 2013-10-08 DIAGNOSIS — W009XXA Unspecified fall due to ice and snow, initial encounter: Secondary | ICD-10-CM

## 2013-10-08 DIAGNOSIS — M47817 Spondylosis without myelopathy or radiculopathy, lumbosacral region: Secondary | ICD-10-CM | POA: Diagnosis not present

## 2013-10-08 DIAGNOSIS — M4310 Spondylolisthesis, site unspecified: Secondary | ICD-10-CM | POA: Diagnosis not present

## 2013-10-08 LAB — BASIC METABOLIC PANEL WITH GFR
BUN: 36 mg/dL — AB (ref 6–23)
CHLORIDE: 101 meq/L (ref 96–112)
CO2: 28 mEq/L (ref 19–32)
CREATININE: 2.36 mg/dL — AB (ref 0.50–1.35)
Calcium: 9.5 mg/dL (ref 8.4–10.5)
GFR, EST AFRICAN AMERICAN: 28 mL/min — AB
GFR, EST NON AFRICAN AMERICAN: 24 mL/min — AB
GLUCOSE: 85 mg/dL (ref 70–99)
Potassium: 4.6 mEq/L (ref 3.5–5.3)
Sodium: 139 mEq/L (ref 135–145)

## 2013-10-08 LAB — CBC WITH DIFFERENTIAL/PLATELET
BASOS ABS: 0.1 10*3/uL (ref 0.0–0.1)
BASOS PCT: 1 % (ref 0–1)
EOS PCT: 3 % (ref 0–5)
Eosinophils Absolute: 0.3 10*3/uL (ref 0.0–0.7)
HEMATOCRIT: 42.9 % (ref 39.0–52.0)
Hemoglobin: 14.3 g/dL (ref 13.0–17.0)
Lymphocytes Relative: 19 % (ref 12–46)
Lymphs Abs: 1.7 10*3/uL (ref 0.7–4.0)
MCH: 29.1 pg (ref 26.0–34.0)
MCHC: 33.3 g/dL (ref 30.0–36.0)
MCV: 87.4 fL (ref 78.0–100.0)
MONO ABS: 0.6 10*3/uL (ref 0.1–1.0)
MONOS PCT: 7 % (ref 3–12)
Neutro Abs: 6.2 10*3/uL (ref 1.7–7.7)
Neutrophils Relative %: 70 % (ref 43–77)
Platelets: 297 10*3/uL (ref 150–400)
RBC: 4.91 MIL/uL (ref 4.22–5.81)
RDW: 14 % (ref 11.5–15.5)
WBC: 8.8 10*3/uL (ref 4.0–10.5)

## 2013-10-08 MED ORDER — FENOFIBRATE MICRONIZED 134 MG PO CAPS: 134.0000 mg | ORAL_CAPSULE | Freq: Every day | ORAL | Status: DC

## 2013-10-08 MED ORDER — AMLODIPINE BESYLATE 5 MG PO TABS: ORAL_TABLET | ORAL | Status: DC

## 2013-10-08 NOTE — Progress Notes (Signed)
Patient ID: Gary Ortega, male   DOB: 12/26/1924, 80 y.o.   MRN: [id number] Subjective:   Subjective:  Gary Ortega is a 80 y.o. male who presents for Medicare Annual Wellness Visit and 3 month follow up for HTN, hyperlipidemia, prediabetes, and vitamin D Def.  Date of last medicare wellness visit was is unknown.  His blood pressure has been controlled at home, today their BP is BP: 114/62 mmHg He does not workout. He denies chest pain, shortness of breath, dizziness.  He is on cholesterol medication and denies myalgias. His cholesterol is at goal. The cholesterol last visit was:   Lab Results  Component Value Date   CHOL 160 09/05/2013   HDL 31* 09/05/2013   LDLCALC 98 09/05/2013   TRIG 155* 09/05/2013   CHOLHDL 5.2 09/05/2013   He has been working on diet and exercise for prediabetes, and denies foot ulcerations, nausea, paresthesia of the feet, visual disturbances and weight loss. Last A1C in the office was:  Lab Results  Component Value Date   HGBA1C 5.7* 09/05/2013   Patient is on Vitamin D supplement.   No components found with this basename: [id number]      Patient Care Team: Unk Pinto, MD as PCP - General Irine Seal, MD as Attending Physician (Urology) Franchot Gallo, MD as Consulting Physician (Urology) Josue Hector, MD as Consulting Physician (Cardiology) [place], MD as Consulting Physician (Vascular Surgery) Winfield Cunas., MD as Consulting Physician (Gastroenterology)  Medication Review: Current Outpatient Prescriptions on File Prior to Visit  Medication Sig Dispense Refill  . carbidopa-levodopa (SINEMET) 25-250 MG per tablet Take 1 tablet by mouth at bedtime. Takes 1 at qhs      . Cholecalciferol (VITAMIN D3) 5000 UNITS CAPS Take by mouth daily.        . clopidogrel (PLAVIX) 75 MG tablet Take 1 tablet (75 mg total) by mouth once.  30 tablet  0  . Fish Oil OIL Take by mouth daily. 1 tablespoon       . metoprolol succinate (TOPROL-XL) 25  MG 24 hr tablet Take 25 mg by mouth daily.      . multivitamin (THERAGRAN) per tablet Take 1 tablet by mouth daily.        . nitroGLYCERIN (NITROSTAT) 0.4 MG SL tablet Place 0.4 mg under the tongue. One tablet under tongue at onset of chest pain; may repeat every 5 minutes for up to 3 doses       . pravastatin (PRAVACHOL) 40 MG tablet Take 40 mg by mouth daily.        . vitamin C (ASCORBIC ACID) 500 MG tablet Take 500 mg by mouth daily.       No current facility-administered medications on file prior to visit.    Current Problems (verified) Patient Active Problem List   Diagnosis Date Noted  . BPH (benign prostatic hypertrophy) with urinary obstruction   . CAD (coronary artery disease) 06/30/2011  . TIA (transient ischemic attack) 06/30/2011  . MIXED HYPERLIPIDEMIA 06/03/2010  . UNSPECIFIED VITAMIN D DEFICIENCY 06/02/2010  . ANXIETY 06/02/2010  . DEPRESSION 06/02/2010  . HYPERTENSION 06/02/2010  . FATIGUE 06/02/2010  . SHORTNESS OF BREATH 06/02/2010  . CHEST PAIN 06/02/2010    Screening Tests Health Maintenance  Topic Date Due  . Zostavax  05/26/1987  . Influenza Vaccine  02/22/2014  . Colonoscopy  07/26/2015  . Tetanus/tdap  07/25/2016  . Pneumococcal Polysaccharide Vaccine Age 79 And Over  Completed    Immunization  History  Administered Date(s) Administered  . Influenza Split 04/04/2013  . Pneumococcal Polysaccharide-23 10/08/2013  . Pneumococcal-Unspecified 07/25/2002  . Td 07/25/2006    Preventative care: Last colonoscopy: 2007  Prior vaccinations: TD or Tdap: 07/25/06  Influenza: 04/04/13 Pneumococcal: 2004, 10/08/13 Shingles/Zostavax: declines due to cost  History reviewed:  Past Medical History  Diagnosis Date  . Hypertension   . Fatigue   . Shortness of breath   . Chest pain   . Vitamin D deficiency     unspec.  Marland Kitchen Anxiety   . Depressed   . Hyperlipidemia   . BPH (benign prostatic hypertrophy) with urinary obstruction   . History of kidney stones    . H/O hemorrhoids    Past Surgical History  Procedure Laterality Date  . Colonoscopy    . Cystoscopy      prostate radioactive I-125 seed impklantation   . Mandible fracture surgery  1948  . Appendectomy    . Hemorrhoid surgery    . Cataract extraction     History  Substance Use Topics  . Smoking status: Never Smoker   . Smokeless tobacco: Not on file  . Alcohol Use: No   Family History  Problem Relation Age of Onset  . Stroke Mother   . Hypertension Mother   . Stroke Father   . Hypertension Brother       Risk Factors: Tobacco History  Substance Use Topics  . Smoking status: Never Smoker   . Smokeless tobacco: Not on file  . Alcohol Use: No   He does not smoke.  Patient is not a former smoker. Are there smokers in your home (other than you)?  No  Alcohol Current alcohol use: none  Caffeine Current caffeine use: coffee 1-2 /day  Exercise Current exercise habits: Home exercise routine includes walking .5 hrs per days.  Current exercise: housecleaning, no regular exercise and walking  Nutrition/Diet Current diet: in general, a "healthy" diet    Cardiac risk factors: advanced age (older than 5 for men, 32 for women), dyslipidemia, male gender and sedentary lifestyle.  Depression Screen Nurse depression screen reviewed.  (Note: if answer to either of the following is "Yes", a more complete depression screening is indicated)   Q1: Over the past two weeks, have you felt down, depressed or hopeless? No  Q2: Over the past two weeks, have you felt little interest or pleasure in doing things? No, occasionally  Have you lost interest or pleasure in daily life? No  Do you often feel hopeless? No  Do you cry easily over simple problems? No  Activities of Daily Living Nurse ADLs screen reviewed.  In your present state of health, do you have any difficulty performing the following activities?:  Driving? No Managing money?  No Feeding yourself? No Getting from  bed to chair? No Climbing a flight of stairs? No Preparing food and eating?: No Bathing or showering? No Getting dressed: No Getting to the toilet? No Using the toilet:No Moving around from place to place: No In the past year have you fallen or had a near fall?:No   Are you sexually active?  No  Do you have more than one partner?  No  Vision Difficulties: No  Hearing Difficulties: No Do you often ask people to speak up or repeat themselves?occasionally Do you experience ringing or noises in your ears? No Do you have difficulty understanding soft or whispered voices? No  Cognition  Do you feel that you have a problem with memory?No  Do you often misplace items? No  Do you feel safe at home?  Yes  Advanced directives Does patient have a [place]? Yes, daughter Does patient have a Living Will? Yes   Objective:     Vision and hearing screens reviewed.   Blood pressure 114/62, pulse 68, temperature 98.4 F (36.9 C), temperature source Temporal, resp. rate 16, height 5\' 7"  (1.702 m), weight 161 lb (73.029 kg). Body mass index is 25.21 kg/(m^2).  General appearance: alert, no distress, WD/WN, male Cognitive Testing  Alert? Yes  Normal Appearance?Yes  Oriented to person? Yes  Place? Yes   Time? Yes  Recall of three objects?  NO  Can perform simple calculations? Yes  Displays appropriate judgment?Yes  Can read the correct time from a watch face?Yes  HEENT: normocephalic, sclerae anicteric, TMs pearly, nares patent, no discharge or erythema, pharynx normal Oral cavity: MMM, no lesions Neck: supple, no lymphadenopathy, no thyromegaly, no masses Heart: RRR, normal S1, S2, no murmurs Lungs: CTA bilaterally, no wheezes, rhonchi, or rales Abdomen: +bs, soft, mild tenderness right mid-low, non distended, no masses, no hepatomegaly, no splenomegaly Musculoskeletal: nontender, no swelling, no obvious deformity, mild discomfort with lumbar l4-5 area with  change of position Extremities: no edema, no cyanosis, no clubbing Pulses: 2+ symmetric, upper and lower extremities, normal cap refill Neurological: alert, oriented x 3, CN2-12 intact, strength normal upper extremities and lower extremities, sensation normal throughout, DTRs 2+ throughout, no cerebellar signs, gait normal Psychiatric: normal affect, behavior normal, pleasant, occasionally tearful when discussing dead wife  Assessment:  1.  Medicare wellness screening- CPE- Update screening labs/ History/ Immunizations/ Testing as needed. Advised healthy diet, QD exercise, increase H20 and continue RX/ Vitamins AD. Reviewed recent HTN/ CHOL/ PreDM labs.  2. Abdomen pain ? Recent fall- Check labs if negative Xray lumbar, if negative xray get CT abdomen/ pelvis, w/c if SX increase or ER.  3. Anxiety/ mild depression Depression- Advised counseling, increase activity, continue prescriptions AD. Patient will call with results  4. Recent abn Lab- Recheck   Plan:  See above During the course of the visit the patient was educated and counseled about appropriate screening and preventive services including:    Pneumococcal vaccine   Diabetes screening  Nutrition counseling   Screening recommendations, referrals: Vaccinations: Tdap vaccine no  Influenza vaccine no Pneumococcal vaccine yes Shingles vaccine no Hep B vaccine no  Nutrition assessed and recommended  Colonoscopy yes Recommended yearly ophthalmology/optometry visit for glaucoma screening and checkup Recommended yearly dental visit for hygiene and checkup Advanced directives - yes  Conditions/risks identified: BMI: Discussed weight loss, diet, and increase physical activity.  Increase physical activity: AHA recommends 150 minutes of physical activity a week.  Medications reviewed Diabetes is at goal, ACE/ARB therapy: Not a DM Urinary Incontinence is not an issue: discussed non pharmacology and pharmacology options.  Fall  risk: low- discussed PT, home fall assessment, medications, 1 recent in ice.    Medicare Attestation I have personally reviewed: The patient's medical and social history Their use of alcohol, tobacco or illicit drugs Their current medications and supplements The patient's functional ability including ADLs,fall risks, home safety risks, cognitive, and hearing and visual impairment Diet and physical activities Evidence for depression or mood disorders  The patient's weight, height, BMI, and visual acuity have been recorded in the chart.  I have made referrals, counseling, and provided education to the patient based on review of the above and I have provided the patient with a  written personalized care plan for preventive services.     Kelby Aline, R, PA-C   10/14/2013    CPT [id number] first AWV CPT [phone] subsequent AWV

## 2013-10-08 NOTE — Patient Instructions (Signed)

## 2013-10-09 ENCOUNTER — Other Ambulatory Visit: Payer: Self-pay | Admitting: Emergency Medicine

## 2013-10-09 DIAGNOSIS — R109 Unspecified abdominal pain: Secondary | ICD-10-CM

## 2013-10-14 ENCOUNTER — Ambulatory Visit: Payer: Self-pay | Admitting: Internal Medicine

## 2013-10-14 DIAGNOSIS — K5730 Diverticulosis of large intestine without perforation or abscess without bleeding: Secondary | ICD-10-CM | POA: Diagnosis not present

## 2013-10-14 DIAGNOSIS — N289 Disorder of kidney and ureter, unspecified: Secondary | ICD-10-CM | POA: Diagnosis not present

## 2013-10-14 DIAGNOSIS — Z905 Acquired absence of kidney: Secondary | ICD-10-CM | POA: Diagnosis not present

## 2013-10-14 DIAGNOSIS — S3981XA Other specified injuries of abdomen, initial encounter: Secondary | ICD-10-CM | POA: Diagnosis not present

## 2013-10-14 DIAGNOSIS — R109 Unspecified abdominal pain: Secondary | ICD-10-CM | POA: Diagnosis not present

## 2013-10-16 ENCOUNTER — Telehealth: Payer: Self-pay | Admitting: Internal Medicine

## 2013-10-16 NOTE — Telephone Encounter (Signed)
REFERRAL TO NEURO SURGEON, CINDY HAS NOT HEARD ANYTHING AND WANTED TO TELL HER DAD THIS EVENING.  PLEASE ADVISE IF APPT HAS BEEN SET UP.

## 2013-10-17 DIAGNOSIS — N281 Cyst of kidney, acquired: Secondary | ICD-10-CM | POA: Diagnosis not present

## 2013-10-17 DIAGNOSIS — D3000 Benign neoplasm of unspecified kidney: Secondary | ICD-10-CM | POA: Diagnosis not present

## 2013-10-17 DIAGNOSIS — C61 Malignant neoplasm of prostate: Secondary | ICD-10-CM | POA: Diagnosis not present

## 2013-10-17 DIAGNOSIS — Z8546 Personal history of malignant neoplasm of prostate: Secondary | ICD-10-CM | POA: Diagnosis not present

## 2013-10-18 NOTE — Telephone Encounter (Signed)
Martie Lee and I sent a fax earlier in the week to the Nephrologist about his appointment being made earlier can you call daughter Jenny Reichmann and see if she has heard anything from them yet?

## 2013-10-18 NOTE — Telephone Encounter (Signed)
Called pt's daughter and he saw Dr. Diona Fanti yesterday morning. Dr. Diona Fanti said he was going to monitor pt for 1 year.

## 2013-11-03 ENCOUNTER — Other Ambulatory Visit: Payer: Self-pay | Admitting: Emergency Medicine

## 2013-11-03 MED ORDER — CARBIDOPA-LEVODOPA 25-250 MG PO TABS: 1.0000 | ORAL_TABLET | Freq: Two times a day (BID) | ORAL | Status: DC

## 2013-11-06 DIAGNOSIS — C44211 Basal cell carcinoma of skin of unspecified ear and external auricular canal: Secondary | ICD-10-CM | POA: Diagnosis not present

## 2013-11-06 DIAGNOSIS — L820 Inflamed seborrheic keratosis: Secondary | ICD-10-CM | POA: Diagnosis not present

## 2013-11-06 DIAGNOSIS — D485 Neoplasm of uncertain behavior of skin: Secondary | ICD-10-CM | POA: Diagnosis not present

## 2013-11-06 DIAGNOSIS — L570 Actinic keratosis: Secondary | ICD-10-CM | POA: Diagnosis not present

## 2013-11-29 ENCOUNTER — Encounter: Payer: Self-pay | Admitting: Internal Medicine

## 2013-11-29 ENCOUNTER — Ambulatory Visit (INDEPENDENT_AMBULATORY_CARE_PROVIDER_SITE_OTHER): Payer: Medicare Other | Admitting: Internal Medicine

## 2013-11-29 VITALS — BP 134/76 | HR 64 | Temp 98.2°F | Resp 16 | Ht 67.0 in | Wt 164.0 lb

## 2013-11-29 DIAGNOSIS — I10 Essential (primary) hypertension: Secondary | ICD-10-CM

## 2013-11-29 DIAGNOSIS — E559 Vitamin D deficiency, unspecified: Secondary | ICD-10-CM | POA: Diagnosis not present

## 2013-11-29 DIAGNOSIS — E782 Mixed hyperlipidemia: Secondary | ICD-10-CM | POA: Diagnosis not present

## 2013-11-29 DIAGNOSIS — R7309 Other abnormal glucose: Secondary | ICD-10-CM | POA: Diagnosis not present

## 2013-11-29 DIAGNOSIS — Z79899 Other long term (current) drug therapy: Secondary | ICD-10-CM | POA: Diagnosis not present

## 2013-11-29 LAB — CBC WITH DIFFERENTIAL/PLATELET
BASOS ABS: 0.1 10*3/uL (ref 0.0–0.1)
BASOS PCT: 1 % (ref 0–1)
EOS ABS: 0.3 10*3/uL (ref 0.0–0.7)
EOS PCT: 4 % (ref 0–5)
HEMATOCRIT: 40.9 % (ref 39.0–52.0)
Hemoglobin: 13.9 g/dL (ref 13.0–17.0)
Lymphocytes Relative: 17 % (ref 12–46)
Lymphs Abs: 1.5 10*3/uL (ref 0.7–4.0)
MCH: 29.6 pg (ref 26.0–34.0)
MCHC: 34 g/dL (ref 30.0–36.0)
MCV: 87 fL (ref 78.0–100.0)
MONO ABS: 0.5 10*3/uL (ref 0.1–1.0)
Monocytes Relative: 6 % (ref 3–12)
NEUTROS ABS: 6.2 10*3/uL (ref 1.7–7.7)
Neutrophils Relative %: 72 % (ref 43–77)
Platelets: 258 10*3/uL (ref 150–400)
RBC: 4.7 MIL/uL (ref 4.22–5.81)
RDW: 13.6 % (ref 11.5–15.5)
WBC: 8.6 10*3/uL (ref 4.0–10.5)

## 2013-11-29 LAB — HEMOGLOBIN A1C
Hgb A1c MFr Bld: 5.9 % — ABNORMAL HIGH (ref <5.7)
Mean Plasma Glucose: 123 mg/dL — ABNORMAL HIGH (ref <117)

## 2013-11-29 NOTE — Progress Notes (Signed)
Patient ID: Gary Ortega, male   DOB: 10-21-1924, 80 y.o.   MRN: [id number]    This very nice 80 y.o. Collyer presents for 3 month follow up with Hypertension, Hyperlipidemia, Pre-Diabetes and Vitamin D Deficiency.    HTN predates many years. BP has been controlled at home. Today's BP: 134/76 mmHg (134/76-left and 138/82-right). Patient has ASCADw/Hx/o TIA in 2010 and a CVA in July 2012.  Patient denies any cardiac type chest pain, palpitations, dyspnea/orthopnea/PND, dizziness, claudication, or dependent edema.   Hyperlipidemia is controlled with diet & meds. Last lipids at goal as below. Patient denies myalgias or other med SE's.  Lab Results  Component Value Date   CHOL 160 09/05/2013   HDL 31* 09/05/2013   LDLCALC 98 09/05/2013   TRIG 155* 09/05/2013   CHOLHDL 5.2 09/05/2013    Also, the patient has history of PreDiabetes with A1c 6.6% in Aug 2010 and last A1c was 5.7% in Feb 2015. Patient denies any symptoms of reactive hypoglycemia, diabetic polys, paresthesias or visual blurring.   Further, Patient has history of Vitamin D Deficiency of 38 in 2008 and last vitamin D was 44 in Sept 2014. Patient supplements vitamin D without any suspected side-effects.    Medication List       amLODipine 5 MG tablet  Commonly known as:  NORVASC  TAKE ONE TABLET BY MOUTH AT BEDTIME FOR BLOOD PRESSURE     carbidopa-levodopa 25-250 MG per tablet  Commonly known as:  SINEMET IR  Take 1 tablet by mouth 2 (two) times daily. takes 1 in AM and 1 in PM     clopidogrel 75 MG tablet  Commonly known as:  PLAVIX  Take 1 tablet (75 mg total) by mouth once.     fenofibrate micronized 134 MG capsule  Commonly known as:  LOFIBRA  Take 1 capsule (134 mg total) by mouth daily.     Fish Oil Oil  Take by mouth daily. 1 tablespoon     metoprolol succinate 25 MG 24 hr tablet  Commonly known as:  TOPROL-XL  Take 25 mg by mouth daily.     multivitamin per tablet  Take 1 tablet by mouth daily.     NITROSTAT  0.4 MG SL tablet  Generic drug:  nitroGLYCERIN  Place 0.4 mg under the tongue. One tablet under tongue at onset of chest pain; may repeat every 5 minutes for up to 3 doses     pravastatin 40 MG tablet  Commonly known as:  PRAVACHOL  Take 40 mg by mouth daily.     vitamin C 500 MG tablet  Commonly known as:  ASCORBIC ACID  Take 500 mg by mouth daily.     Vitamin D3 5000 UNITS Caps  Take by mouth daily.       Allergies  Allergen Reactions  . Ace Inhibitors   . Lisinopril     Affects kidney  . Nsaids   . Wellbutrin [Bupropion]     confusion   PMHx:   Past Medical History  Diagnosis Date  . Hypertension   . Fatigue   . Shortness of breath   . Chest pain   . Vitamin D deficiency     unspec.  Marland Kitchen Anxiety   . Depressed   . Hyperlipidemia   . BPH (benign prostatic hypertrophy) with urinary obstruction   . History of kidney stones   . H/O hemorrhoids    FHx:    Reviewed / unchanged  SHx:    Reviewed /  unchanged   Systems Review: Constitutional: Denies fever, chills, wt changes, headaches, insomnia, fatigue, night sweats, change in appetite. Eyes: Denies redness, blurred vision, diplopia, discharge, itchy, watery eyes.  ENT: Denies discharge, congestion, post nasal drip, epistaxis, sore throat, earache, hearing loss, dental pain, tinnitus, vertigo, sinus pain, snoring.  CV: Denies chest pain, palpitations, irregular heartbeat, syncope, dyspnea, diaphoresis, orthopnea, PND, claudication or edema. Respiratory: denies cough, dyspnea, DOE, pleurisy, hoarseness, laryngitis, wheezing.  Gastrointestinal: Denies dysphagia, odynophagia, heartburn, reflux, water brash, abdominal pain or cramps, nausea, vomiting, bloating, diarrhea, constipation, hematemesis, melena, hematochezia  or hemorrhoids. Genitourinary: Denies dysuria, frequency, urgency, nocturia, hesitancy, discharge, hematuria or flank pain. Musculoskeletal: Denies arthralgias, myalgias, stiffness, jt. swelling, pain,  limping or strain/sprain.  Skin: Denies pruritus, rash, hives, warts, acne, eczema or change in skin lesion(s). Neuro: No weakness, tremor, incoordination, spasms, paresthesia or pain. Psychiatric: Denies confusion, memory loss or sensory loss. Endo: Denies change in weight, skin or hair change.  Heme/Lymph: No excessive bleeding, bruising or enlarged lymph nodes.   Exam:  BP 134/76  Pulse 64  Temp 98.2 F   Resp 16  Ht 5\' 7"    Wt 164 lb   BMI 25.68 kg/m2  Appears well nourished - in no distress. Eyes: PERRLA, EOMs, conjunctiva no swelling or erythema. Sinuses: No frontal/maxillary tenderness ENT/Mouth: EAC's clear, TM's nl w/o erythema, bulging. Nares clear w/o erythema, swelling, exudates. Oropharynx clear without erythema or exudates. Oral hygiene is good. Tongue normal, non obstructing. Hearing intact.  Neck: Supple. Thyroid nl. Car 2+/2+ without bruits, nodes or JVD. Chest: Respirations nl with BS clear & equal w/o rales, rhonchi, wheezing or stridor.  Cor: Heart sounds normal w/ regular rate and rhythm without sig. murmurs, gallops, clicks, or rubs. Peripheral pulses normal and equal  without edema.  Abdomen: Soft & bowel sounds normal. Non-tender w/o guarding, rebound, hernias, masses, or organomegaly.  Lymphatics: Unremarkable.  Musculoskeletal: Full ROM all peripheral extremities, joint stability, 5/5 strength, and normal gait.  Skin: Warm, dry without exposed rashes, lesions or ecchymosis apparent.  Neuro: Cranial nerves intact, reflexes equal bilaterally. Sensory-motor testing grossly intact. Tendon reflexes grossly intact.  Pysch: Alert & oriented x 3. Insight and judgement nl & appropriate. No ideations.  Assessment and Plan:  1. Hypertension - Continue monitor blood pressure at home. Continue diet/meds same.  2. Hyperlipidemia - Continue diet/meds, exercise,& lifestyle modifications. Continue monitor periodic cholesterol/liver & renal functions   3. Pre-diabetes -  Continue diet, exercise, lifestyle modifications. Monitor appropriate labs.  4. Vitamin D Deficiency - Continue supplementation.  Recommended regular exercise, BP monitoring, weight control, and discussed med and SE's. Recommended labs to assess and monitor clinical status. Further disposition pending results of labs.

## 2013-11-29 NOTE — Patient Instructions (Signed)

## 2013-11-30 LAB — HEPATIC FUNCTION PANEL
ALBUMIN: 4.5 g/dL (ref 3.5–5.2)
AST: 21 U/L (ref 0–37)
Alkaline Phosphatase: 28 U/L — ABNORMAL LOW (ref 39–117)
Bilirubin, Direct: 0.1 mg/dL (ref 0.0–0.3)
Indirect Bilirubin: 0.3 mg/dL (ref 0.2–1.2)
TOTAL PROTEIN: 7 g/dL (ref 6.0–8.3)
Total Bilirubin: 0.4 mg/dL (ref 0.2–1.2)

## 2013-11-30 LAB — BASIC METABOLIC PANEL WITH GFR
BUN: 31 mg/dL — AB (ref 6–23)
CALCIUM: 9.6 mg/dL (ref 8.4–10.5)
CO2: 26 mEq/L (ref 19–32)
CREATININE: 2.29 mg/dL — AB (ref 0.50–1.35)
Chloride: 100 mEq/L (ref 96–112)
GFR, EST AFRICAN AMERICAN: 29 mL/min — AB
GFR, EST NON AFRICAN AMERICAN: 25 mL/min — AB
GLUCOSE: 101 mg/dL — AB (ref 70–99)
POTASSIUM: 4.7 meq/L (ref 3.5–5.3)
Sodium: 136 mEq/L (ref 135–145)

## 2013-11-30 LAB — LIPID PANEL
CHOLESTEROL: 152 mg/dL (ref 0–200)
HDL: 34 mg/dL — ABNORMAL LOW (ref >39)
LDL Cholesterol: 88 mg/dL (ref 0–99)
TRIGLYCERIDES: 148 mg/dL (ref <150)
Total CHOL/HDL Ratio: 4.5 Ratio
VLDL: 30 mg/dL (ref 0–40)

## 2013-11-30 LAB — TSH: TSH: 1.456 u[IU]/mL (ref 0.350–4.500)

## 2013-11-30 LAB — MAGNESIUM: MAGNESIUM: 2.3 mg/dL (ref 1.5–2.5)

## 2013-11-30 LAB — VITAMIN D 25 HYDROXY (VIT D DEFICIENCY, FRACTURES): VIT D 25 HYDROXY: 75 ng/mL (ref 30–89)

## 2013-11-30 LAB — INSULIN, FASTING: INSULIN FASTING, SERUM: 14 u[IU]/mL (ref 3–28)

## 2013-12-04 DIAGNOSIS — C44519 Basal cell carcinoma of skin of other part of trunk: Secondary | ICD-10-CM | POA: Diagnosis not present

## 2013-12-04 DIAGNOSIS — D485 Neoplasm of uncertain behavior of skin: Secondary | ICD-10-CM | POA: Diagnosis not present

## 2013-12-04 DIAGNOSIS — C44211 Basal cell carcinoma of skin of unspecified ear and external auricular canal: Secondary | ICD-10-CM | POA: Diagnosis not present

## 2013-12-17 DIAGNOSIS — C44519 Basal cell carcinoma of skin of other part of trunk: Secondary | ICD-10-CM | POA: Diagnosis not present

## 2013-12-17 DIAGNOSIS — D485 Neoplasm of uncertain behavior of skin: Secondary | ICD-10-CM | POA: Diagnosis not present

## 2014-03-21 ENCOUNTER — Encounter: Payer: Self-pay | Admitting: Internal Medicine

## 2014-03-21 ENCOUNTER — Other Ambulatory Visit: Payer: Self-pay

## 2014-03-21 MED ORDER — AMLODIPINE BESYLATE 5 MG PO TABS: ORAL_TABLET | ORAL | Status: DC

## 2014-03-24 ENCOUNTER — Encounter: Payer: Self-pay | Admitting: Internal Medicine

## 2014-03-27 ENCOUNTER — Ambulatory Visit (INDEPENDENT_AMBULATORY_CARE_PROVIDER_SITE_OTHER): Payer: Medicare Other | Admitting: Internal Medicine

## 2014-03-27 ENCOUNTER — Encounter: Payer: Self-pay | Admitting: Internal Medicine

## 2014-03-27 VITALS — BP 130/74 | HR 72 | Temp 98.1°F | Resp 16 | Ht 67.0 in | Wt 162.4 lb

## 2014-03-27 DIAGNOSIS — E559 Vitamin D deficiency, unspecified: Secondary | ICD-10-CM | POA: Diagnosis not present

## 2014-03-27 DIAGNOSIS — Z125 Encounter for screening for malignant neoplasm of prostate: Secondary | ICD-10-CM

## 2014-03-27 DIAGNOSIS — I10 Essential (primary) hypertension: Secondary | ICD-10-CM | POA: Diagnosis not present

## 2014-03-27 DIAGNOSIS — G20 Parkinson's disease: Secondary | ICD-10-CM | POA: Insufficient documentation

## 2014-03-27 DIAGNOSIS — E1129 Type 2 diabetes mellitus with other diabetic kidney complication: Secondary | ICD-10-CM

## 2014-03-27 DIAGNOSIS — Z789 Other specified health status: Secondary | ICD-10-CM

## 2014-03-27 DIAGNOSIS — Z79899 Other long term (current) drug therapy: Secondary | ICD-10-CM

## 2014-03-27 DIAGNOSIS — Z23 Encounter for immunization: Secondary | ICD-10-CM

## 2014-03-27 DIAGNOSIS — Z1212 Encounter for screening for malignant neoplasm of rectum: Secondary | ICD-10-CM

## 2014-03-27 DIAGNOSIS — E782 Mixed hyperlipidemia: Secondary | ICD-10-CM

## 2014-03-27 DIAGNOSIS — Z1331 Encounter for screening for depression: Secondary | ICD-10-CM

## 2014-03-27 LAB — CBC WITH DIFFERENTIAL/PLATELET
Basophils Absolute: 0.1 10*3/uL (ref 0.0–0.1)
Basophils Relative: 1 % (ref 0–1)
Eosinophils Absolute: 0.5 10*3/uL (ref 0.0–0.7)
Eosinophils Relative: 6 % — ABNORMAL HIGH (ref 0–5)
HCT: 42.1 % (ref 39.0–52.0)
Hemoglobin: 14.2 g/dL (ref 13.0–17.0)
Lymphocytes Relative: 16 % (ref 12–46)
Lymphs Abs: 1.4 10*3/uL (ref 0.7–4.0)
MCH: 28.8 pg (ref 26.0–34.0)
MCHC: 33.7 g/dL (ref 30.0–36.0)
MCV: 85.4 fL (ref 78.0–100.0)
Monocytes Absolute: 0.9 10*3/uL (ref 0.1–1.0)
Monocytes Relative: 10 % (ref 3–12)
Neutro Abs: 5.9 10*3/uL (ref 1.7–7.7)
Neutrophils Relative %: 67 % (ref 43–77)
Platelets: 298 10*3/uL (ref 150–400)
RBC: 4.93 MIL/uL (ref 4.22–5.81)
RDW: 14.4 % (ref 11.5–15.5)
WBC: 8.8 10*3/uL (ref 4.0–10.5)

## 2014-03-27 LAB — HEMOGLOBIN A1C
Hgb A1c MFr Bld: 6.2 % — ABNORMAL HIGH (ref <5.7)
Mean Plasma Glucose: 131 mg/dL — ABNORMAL HIGH (ref <117)

## 2014-03-27 NOTE — Patient Instructions (Signed)

## 2014-03-27 NOTE — Progress Notes (Signed)
Patient ID: Gary Ortega, male   DOB: Dec 19, 1924, 80 y.o.   MRN: [id number]   Annual Screening Comprehensive Examination  This very nice 80 y.o.WWM presents for complete physical.  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   HTN predates many years. Patient's BP has been controlled and today's BP: 130/74 mmHg.  Patient had a TIA in 2010 and in 2012 had a CVA which recovered w/o sequellae. Patient has Stage 4 CKD attributed to HT causes as well as post nephrectomy for Onco tumor. Patient denies any cardiac symptoms as chest pain, palpitations, shortness of breath, dizziness or ankle swelling.   Patient's hyperlipidemia is controlled with diet and medications. Patient denies myalgias or other medication SE's. Last lipids were  Chol 152; HDL 34*; LDL  88; Trig 148 on 11/29/2013.   Patient has T2_NIDDM  With A1c 6.6% in 2010 w/Stage 4 CKD and he's managed with diet. Patient denies reactive hypoglycemic symptoms, visual blurring, diabetic polys or paresthesias. Last A1c was 5.9% on 29 Nov 2013.   In 2008, Patient was Dx'd with Prostate Cancer and was treated by Brachytherapy. Other problems include Parkinson's Disease followed by Neurology.   Finally, patient has history of Vitamin D Deficiency of 38 in 2008 and last vitamin D was5/02/2014: Vit D, 25-Hydroxy 75 on 29 Nov 2013  Medication Sig  . amLODipine (NORVASC) 5 MG tablet TAKE ONE TABLET BY MOUTH AT BEDTIME FOR BLOOD PRESSURE  . SINEMET IR 25-250 MG  Take 1 tablet  2 times daily  . VITAMIN D 5000 UNITS  Take by mouth daily.    . clopidogrel (PLAVIX) 75 MG  Take 1 tablet  once.  . fenofibrate (LOFIBRA) 134 MG  Take 1 capsule  daily.  . Fish Oil OIL Take 1 tablespoon daily.    . metoprolol succinate -XL 25 MG   Take 25 mg by mouth daily.  . multivitamin  Take 1 tablet by mouth daily.    Marland Kitchen NITROSTAT)0.4 MG SL tablet prn  . pravastatin  40 MG tablet Take 40 mg by mouth daily.    .  vitamin C  500 MG tablet Take 500 mg by mouth daily.    Allergies  Allergen Reactions  . Ace Inhibitors   . Lisinopril     Affects kidney  . Nsaids   . Wellbutrin [Bupropion]     confusion   Past Medical History  Diagnosis Date  . Hypertension   . Fatigue   . Shortness of breath   . Chest pain   . Vitamin D deficiency     unspec.  Marland Kitchen Anxiety   . Depressed   . Hyperlipidemia   . BPH (benign prostatic hypertrophy) with urinary obstruction   . History of kidney stones   . H/O hemorrhoids    Past Surgical History  Procedure Laterality Date  . Colonoscopy    . Cystoscopy      prostate radioactive I-125 seed impklantation   . Mandible fracture surgery  1948  . Appendectomy    . Hemorrhoid surgery    . Cataract extraction     Family History  Problem Relation Age of Onset  . Stroke Mother   . Hypertension Mother   . Stroke Father   . Hypertension Brother    History   Social History  . Marital Status: Married    Spouse Name: N/A    Number of Children: N/A  . Years of Education: N/A  Occupational History  . Not on file.   Social History Main Topics  . Smoking status: Never Smoker   . Smokeless tobacco: Not on file  . Alcohol Use: No  . Drug Use:   . Sexual Activity:    Other Topics Concern  . Not on file   Social History Narrative   Married 63 years. Retired form [employer]    ROS Constitutional: Denies fever, chills, weight loss/gain, headaches, insomnia, fatigue, night sweats or change in appetite. Eyes: Denies redness, blurred vision, diplopia, discharge, itchy or watery eyes.  ENT: Denies discharge, congestion, post nasal drip, epistaxis, sore throat, earache, hearing loss, dental pain, Tinnitus, Vertigo, Sinus pain or snoring.  Cardio: Denies chest pain, palpitations, irregular heartbeat, syncope, dyspnea, diaphoresis, orthopnea, PND, claudication or edema Respiratory: denies cough, dyspnea, DOE, pleurisy, hoarseness, laryngitis or wheezing.   Gastrointestinal: Denies dysphagia, heartburn, reflux, water brash, pain, cramps, nausea, vomiting, bloating, diarrhea, constipation, hematemesis, melena, hematochezia, jaundice or hemorrhoids Genitourinary: Denies dysuria, frequency, urgency, nocturia, hesitancy, discharge, hematuria or flank pain Musculoskeletal: Denies arthralgia, myalgia, stiffness, Jt. Swelling, pain, limp or strain/sprain. Denies Falls. Skin: Denies puritis, rash, hives, warts, acne, eczema or change in skin lesion Neuro: No weakness, tremor, incoordination, spasms, paresthesia or pain Psychiatric: Denies confusion, memory loss or sensory loss. Denies Depression. Endocrine: Denies change in weight, skin, hair change, nocturia, and paresthesia, diabetic polys, visual blurring or hyper / hypo glycemic episodes.  Heme/Lymph: No excessive bleeding, bruising or enlarged lymph nodes.   Physical Exam  BP 130/74  Pulse 72  Temp(Src) 98.1 F (36.7 C) (Temporal)  Resp 16  Ht 5\' 7"  (1.702 m)  Wt 162 lb 6.4 oz (73.664 kg)  BMI 25.43 kg/m2  General Appearance: Well nourished w/ masked facies and in no apparent distress. Eyes: PERRLA, EOMs, conjunctiva no swelling or erythema, normal fundi and vessels. Sinuses: No frontal/maxillary tenderness ENT/Mouth: EACs patent / TMs  nl. Nares clear without erythema, swelling, mucoid exudates. Oral hygiene is good. No erythema, swelling, or exudate. Tongue normal, non-obstructing. Tonsils not swollen or erythematous. Hearing normal.  Neck: Supple, thyroid normal. No bruits, nodes or JVD. Respiratory: Respiratory effort normal.  BS equal and clear bilateral without rales, rhonci, wheezing or stridor. Cardio: Heart sounds are normal with regular rate and rhythm and no murmurs, rubs or gallops. Peripheral pulses are normal and equal bilaterally without edema. No aortic or femoral bruits. Chest: symmetric with normal excursions and percussion.  Abdomen: Flat, soft, with bowl sounds.  Nontender, no guarding, rebound, hernias, masses, or organomegaly.  Lymphatics: Non tender without lymphadenopathy.  Genitourinary: Deferred to Urology. Musculoskeletal: Full ROM all peripheral extremities, joint stability and normal gait. Skin: Warm and dry without rashes, lesions, cyanosis, clubbing or  ecchymosis.  Neuro: Cranial nerves intact, reflexes equal bilaterally. Bradykinesia. Slight cog wheeling with sl pill rolling tremor in thumbs. No cerebellar symptoms. Sensation intact.  Pysch: Awake and oriented X 3 with flat affect.   Assessment and Plan  1. Annual Screening Examination 2. Hypertension  3. Hyperlipidemia 4. T2_NIDDM w/Stage 4 CKD 5. Vitamin D Deficiency 6. Parkinson's Dz 7. Prostate Cancer  Continue prudent diet as discussed, weight control, BP monitoring, regular exercise, and medications as discussed.  Discussed med effects and SE's. Routine screening labs and tests as requested with regular follow-up as recommended.

## 2014-03-28 LAB — BASIC METABOLIC PANEL WITH GFR
BUN: 43 mg/dL — ABNORMAL HIGH (ref 6–23)
CO2: 26 mEq/L (ref 19–32)
Calcium: 10.1 mg/dL (ref 8.4–10.5)
Chloride: 104 mEq/L (ref 96–112)
Creat: 2.42 mg/dL — ABNORMAL HIGH (ref 0.50–1.35)
GFR, Est African American: 27 mL/min — ABNORMAL LOW
GFR, Est Non African American: 23 mL/min — ABNORMAL LOW
Glucose, Bld: 102 mg/dL — ABNORMAL HIGH (ref 70–99)
Potassium: 4.7 mEq/L (ref 3.5–5.3)
Sodium: 140 mEq/L (ref 135–145)

## 2014-03-28 LAB — HEPATIC FUNCTION PANEL
ALK PHOS: 34 U/L — AB (ref 39–117)
ALT: 9 U/L (ref 0–53)
AST: 21 U/L (ref 0–37)
Albumin: 4.8 g/dL (ref 3.5–5.2)
BILIRUBIN TOTAL: 0.4 mg/dL (ref 0.2–1.2)
Bilirubin, Direct: 0.1 mg/dL (ref 0.0–0.3)
Indirect Bilirubin: 0.3 mg/dL (ref 0.2–1.2)
Total Protein: 7.7 g/dL (ref 6.0–8.3)

## 2014-03-28 LAB — URINALYSIS, MICROSCOPIC ONLY
Bacteria, UA: NONE SEEN
CASTS: NONE SEEN
Crystals: NONE SEEN
SQUAMOUS EPITHELIAL / LPF: NONE SEEN

## 2014-03-28 LAB — TSH: TSH: 1.071 u[IU]/mL (ref 0.350–4.500)

## 2014-03-28 LAB — MICROALBUMIN / CREATININE URINE RATIO
Creatinine, Urine: 84.9 mg/dL
Microalb Creat Ratio: 519.1 mg/g — ABNORMAL HIGH (ref 0.0–30.0)
Microalb, Ur: 44.07 mg/dL — ABNORMAL HIGH (ref 0.00–1.89)

## 2014-03-28 LAB — INSULIN, FASTING: Insulin fasting, serum: 12.7 u[IU]/mL (ref 2.0–19.6)

## 2014-03-28 LAB — LIPID PANEL
Cholesterol: 180 mg/dL (ref 0–200)
HDL: 37 mg/dL — ABNORMAL LOW (ref >39)
LDL Cholesterol: 96 mg/dL (ref 0–99)
Total CHOL/HDL Ratio: 4.9 Ratio
Triglycerides: 237 mg/dL — ABNORMAL HIGH (ref <150)
VLDL: 47 mg/dL — ABNORMAL HIGH (ref 0–40)

## 2014-03-28 LAB — PSA: PSA: 0.04 ng/mL (ref <=4.00)

## 2014-03-28 LAB — MAGNESIUM: Magnesium: 2.4 mg/dL (ref 1.5–2.5)

## 2014-03-28 LAB — VITAMIN D 25 HYDROXY (VIT D DEFICIENCY, FRACTURES): Vit D, 25-Hydroxy: 86 ng/mL (ref 30–89)

## 2014-03-29 ENCOUNTER — Encounter: Payer: Self-pay | Admitting: Internal Medicine

## 2014-04-01 ENCOUNTER — Telehealth: Payer: Self-pay | Admitting: *Deleted

## 2014-04-01 NOTE — Telephone Encounter (Signed)
Daughter, Candie Mile aware of lab results.

## 2014-04-01 NOTE — Telephone Encounter (Signed)
Message copied by Emelda Brothers on Tue Apr 01, 2014  2:36 PM ------      Message from: Unk Pinto      Created: Sat Mar 29, 2014  8:45 AM      Regarding: Labs       Call daughter - all labs reveid and no significant abnormalities -      - PSA 0 very low       - Kidney functions stable, but suggest need to drink more water      - Chol 160 - great       - Vit D 86 - great ------

## 2014-04-07 ENCOUNTER — Other Ambulatory Visit (INDEPENDENT_AMBULATORY_CARE_PROVIDER_SITE_OTHER): Payer: Medicare Other | Admitting: *Deleted

## 2014-04-07 DIAGNOSIS — Z1212 Encounter for screening for malignant neoplasm of rectum: Secondary | ICD-10-CM | POA: Diagnosis not present

## 2014-04-07 LAB — POC HEMOCCULT BLD/STL (HOME/3-CARD/SCREEN)
Card #3 Fecal Occult Blood, POC: NEGATIVE
FECAL OCCULT BLD: NEGATIVE
FECAL OCCULT BLD: NEGATIVE

## 2014-04-10 ENCOUNTER — Telehealth: Payer: Self-pay

## 2014-04-10 NOTE — Telephone Encounter (Signed)
Patient has had diarrhea since Monday. Denies fever, nausea, vomiting. Patient's daughter is giving him Immodium 4 tablets daily. Per Dr.McKeown, patient can increase immodium to 12 tablets daily and push fluids. If patient's symptoms increase, he is advised to go to ER. Patient's daughter aware.

## 2014-05-23 ENCOUNTER — Other Ambulatory Visit: Payer: Self-pay | Admitting: Emergency Medicine

## 2014-05-25 ENCOUNTER — Other Ambulatory Visit: Payer: Self-pay | Admitting: Internal Medicine

## 2014-06-12 ENCOUNTER — Other Ambulatory Visit: Payer: Self-pay

## 2014-06-12 ENCOUNTER — Ambulatory Visit (INDEPENDENT_AMBULATORY_CARE_PROVIDER_SITE_OTHER): Payer: Medicare Other | Admitting: Internal Medicine

## 2014-06-12 ENCOUNTER — Encounter: Payer: Self-pay | Admitting: Internal Medicine

## 2014-06-12 VITALS — BP 118/78 | HR 64 | Temp 98.1°F | Resp 16 | Ht 67.0 in | Wt 159.0 lb

## 2014-06-12 DIAGNOSIS — I10 Essential (primary) hypertension: Secondary | ICD-10-CM | POA: Diagnosis not present

## 2014-06-12 DIAGNOSIS — M791 Myalgia, unspecified site: Secondary | ICD-10-CM

## 2014-06-12 DIAGNOSIS — Z79899 Other long term (current) drug therapy: Secondary | ICD-10-CM | POA: Diagnosis not present

## 2014-06-12 DIAGNOSIS — R5383 Other fatigue: Secondary | ICD-10-CM

## 2014-06-12 LAB — CBC WITH DIFFERENTIAL/PLATELET
BASOS PCT: 1 % (ref 0–1)
Basophils Absolute: 0.1 10*3/uL (ref 0.0–0.1)
EOS ABS: 0.2 10*3/uL (ref 0.0–0.7)
Eosinophils Relative: 3 % (ref 0–5)
HEMATOCRIT: 41.8 % (ref 39.0–52.0)
Hemoglobin: 13.9 g/dL (ref 13.0–17.0)
Lymphocytes Relative: 17 % (ref 12–46)
Lymphs Abs: 1.3 10*3/uL (ref 0.7–4.0)
MCH: 28.7 pg (ref 26.0–34.0)
MCHC: 33.3 g/dL (ref 30.0–36.0)
MCV: 86.4 fL (ref 78.0–100.0)
MPV: 9.7 fL (ref 9.4–12.4)
Monocytes Absolute: 0.7 10*3/uL (ref 0.1–1.0)
Monocytes Relative: 9 % (ref 3–12)
Neutro Abs: 5.3 10*3/uL (ref 1.7–7.7)
Neutrophils Relative %: 70 % (ref 43–77)
Platelets: 288 10*3/uL (ref 150–400)
RBC: 4.84 MIL/uL (ref 4.22–5.81)
RDW: 15.1 % (ref 11.5–15.5)
WBC: 7.5 10*3/uL (ref 4.0–10.5)

## 2014-06-12 LAB — HEPATIC FUNCTION PANEL
AST: 24 U/L (ref 0–37)
Albumin: 4.6 g/dL (ref 3.5–5.2)
Alkaline Phosphatase: 26 U/L — ABNORMAL LOW (ref 39–117)
BILIRUBIN INDIRECT: 0.5 mg/dL (ref 0.2–1.2)
Bilirubin, Direct: 0.1 mg/dL (ref 0.0–0.3)
TOTAL PROTEIN: 7.3 g/dL (ref 6.0–8.3)
Total Bilirubin: 0.6 mg/dL (ref 0.2–1.2)

## 2014-06-12 LAB — BASIC METABOLIC PANEL WITH GFR
BUN: 36 mg/dL — AB (ref 6–23)
CO2: 26 mEq/L (ref 19–32)
Calcium: 9.8 mg/dL (ref 8.4–10.5)
Chloride: 102 mEq/L (ref 96–112)
Creat: 2.23 mg/dL — ABNORMAL HIGH (ref 0.50–1.35)
GFR, EST AFRICAN AMERICAN: 30 mL/min — AB
GFR, Est Non African American: 26 mL/min — ABNORMAL LOW
Glucose, Bld: 93 mg/dL (ref 70–99)
Potassium: 4.5 mEq/L (ref 3.5–5.3)
Sodium: 139 mEq/L (ref 135–145)

## 2014-06-12 LAB — TSH: TSH: 1.386 u[IU]/mL (ref 0.350–4.500)

## 2014-06-12 LAB — MAGNESIUM: Magnesium: 2.3 mg/dL (ref 1.5–2.5)

## 2014-06-12 LAB — CK: Total CK: 181 U/L (ref 7–232)

## 2014-06-12 MED ORDER — NITROGLYCERIN 0.4 MG SL SUBL: 0.4000 mg | SUBLINGUAL_TABLET | SUBLINGUAL | Status: DC | PRN

## 2014-06-12 NOTE — Progress Notes (Signed)
Subjective:    Patient ID: Gary Ortega, male    DOB: Dec 01, 1924, 80 y.o.   MRN: [id number]  HPI Patient with HTN, ASCVD/TIAs, HLD, T2DM and Parkinsons's Dz presents w/ c/o extreme fatigue and weakness & cramps in legs. Systems review is totally negative.  Medication Sig  . amLODipine  5 MG tablet TAKE ONE TABLET AT BEDTIME   . carbidopa-levodopa  25-250  TAKE 1 TABLET TWICE A DAY  . VITAMIN D 5000 UNITS  Take by mouth daily.    . clopidogrel (PLAVIX) 75 MG tablet Take 1 tablet (75 mg total) by mouth once.  . fenofibrate  134 MG capsule Take 1 capsule (134 mg total) by mouth daily.  . Fish Oil OIL Take by mouth daily. 1 tablespoon   . metoprolol succinate -XL 25 MG  Take 25 mg by mouth daily.  . multivitamin  Take 1 tablet by mouth daily.    . nitroGLYCERIN 0.4 MG  as needed   . pravastatin  40 MG tablet TAKE ONE TABLET AT BEDTIME  . vitamin C 500 MG tablet Take 500 mg by mouth daily.   Allergies  Allergen Reactions  . Ace Inhibitors   . Lisinopril     Affects kidney  . Nsaids   . Wellbutrin [Bupropion]     confusion   Past Medical History  Diagnosis Date  . Hypertension   . Fatigue   . Shortness of breath   . Chest pain   . Vitamin D deficiency     unspec.  Marland Kitchen Anxiety   . Depressed   . Hyperlipidemia   . BPH (benign prostatic hypertrophy) with urinary obstruction   . History of kidney stones   . H/O hemorrhoids    Past Surgical History  Procedure Laterality Date  . Colonoscopy    . Cystoscopy      prostate radioactive I-125 seed impklantation   . Mandible fracture surgery  1948  . Appendectomy    . Hemorrhoid surgery    . Cataract extraction     Review of Systems  In addition to the HPI above,  No Fever-chills,  No Headache, No changes with Vision or hearing,  No problems swallowing food or Liquids,  No Chest pain or productive Cough or Shortness of Breath,  No Abdominal pain, No Nausea or Vomitting, Bowel movements are regular,  No Blood in stool  or Urine,  No dysuria,  No new skin rashes or bruises,  No new joints pains-aches, Does c/o some muscle aching No new asymmetric weakness, tingling, numbness in any extremity,  No recent weight loss,  No polyuria, polydypsia or polyphagia,  No significant Mental Stressors.  A full 10 point Review of Systems was done, except as stated above, all other Review of Systems were negative    Objective:   Physical Exam  BP 118/78 mmHg  Pulse 64  Temp(Src) 98.1 F (36.7 C)  Resp 16  Ht 5\' 7"  (1.702 m)  Wt 159 lb (72.122 kg)  BMI 24.90 kg/m2  HEENT - Eac's patent. TM's Nl. EOM's full. PERRLA. NasoOroPharynx clear. Neck - supple. Nl Thyroid. Carotids 2+ & No bruits, nodes, JVD Chest - Clear equal BS w/o Rales, rhonchi, wheezes. Cor - Nl HS. RRR w/o sig MGR. PP 1(+). No edema. Abd - No palpable organomegaly, masses or tenderness. BS nl. MS- FROM w/o deformities. Muscle power, tone and bulk Decreased. Gait Nl. Neuro - No obvious Cr N abnormalities. Sensory, motor and Cerebellar functions appear Nl w/o  focal abnormalities. Psyche - Mental status dulled with flat affect.  No delusions, ideations or obvious mood abnormalities.     Assessment & Plan:   1. Essential hypertension  - TSH  2. Other fatigue  - CBC with Differential - BASIC METABOLIC PANEL WITH GFR - Hepatic function panel - Magnesium  3. Myalgia  - CK

## 2014-06-16 ENCOUNTER — Telehealth: Payer: Self-pay

## 2014-06-16 NOTE — Telephone Encounter (Signed)
Left message for patient to return my call for lab results. 

## 2014-06-16 NOTE — Telephone Encounter (Signed)
-----   Message from Unk Pinto, MD sent at 06/15/2014  2:34 PM EST ----- - CBC Kidneys/Electrolytes Liver Magnesium & Thyroid all Nl/OK - CPK Muscle Enzymes Nl/OK - Suggest stop Pravastatin for 2 weeks to see if aching goes away - if not Then restart pravastatin and stop the fenofibrate for 2 weeks to see if makes a difference

## 2014-07-03 ENCOUNTER — Ambulatory Visit: Payer: Self-pay | Admitting: Physician Assistant

## 2014-07-08 ENCOUNTER — Ambulatory Visit (INDEPENDENT_AMBULATORY_CARE_PROVIDER_SITE_OTHER): Payer: Medicare Other | Admitting: Internal Medicine

## 2014-07-08 ENCOUNTER — Encounter: Payer: Self-pay | Admitting: Internal Medicine

## 2014-07-08 VITALS — BP 126/68 | HR 76 | Temp 98.2°F | Resp 16 | Ht 67.0 in | Wt 160.2 lb

## 2014-07-08 DIAGNOSIS — R296 Repeated falls: Secondary | ICD-10-CM

## 2014-07-08 DIAGNOSIS — Z79899 Other long term (current) drug therapy: Secondary | ICD-10-CM | POA: Diagnosis not present

## 2014-07-08 DIAGNOSIS — I10 Essential (primary) hypertension: Secondary | ICD-10-CM

## 2014-07-08 DIAGNOSIS — R531 Weakness: Secondary | ICD-10-CM | POA: Diagnosis not present

## 2014-07-08 LAB — BASIC METABOLIC PANEL WITH GFR
BUN: 33 mg/dL — AB (ref 6–23)
CALCIUM: 9.5 mg/dL (ref 8.4–10.5)
CO2: 25 meq/L (ref 19–32)
CREATININE: 2.36 mg/dL — AB (ref 0.50–1.35)
Chloride: 103 mEq/L (ref 96–112)
GFR, Est African American: 28 mL/min — ABNORMAL LOW
GFR, Est Non African American: 24 mL/min — ABNORMAL LOW
Glucose, Bld: 99 mg/dL (ref 70–99)
Potassium: 4.5 mEq/L (ref 3.5–5.3)
Sodium: 139 mEq/L (ref 135–145)

## 2014-07-08 LAB — CBC WITH DIFFERENTIAL/PLATELET
Basophils Absolute: 0 10*3/uL (ref 0.0–0.1)
Basophils Relative: 0 % (ref 0–1)
EOS PCT: 1 % (ref 0–5)
Eosinophils Absolute: 0.1 10*3/uL (ref 0.0–0.7)
HCT: 39.5 % (ref 39.0–52.0)
HEMOGLOBIN: 13.1 g/dL (ref 13.0–17.0)
LYMPHS ABS: 0.9 10*3/uL (ref 0.7–4.0)
Lymphocytes Relative: 8 % — ABNORMAL LOW (ref 12–46)
MCH: 28.6 pg (ref 26.0–34.0)
MCHC: 33.2 g/dL (ref 30.0–36.0)
MCV: 86.2 fL (ref 78.0–100.0)
MONO ABS: 1 10*3/uL (ref 0.1–1.0)
MPV: 9.5 fL (ref 9.4–12.4)
Monocytes Relative: 9 % (ref 3–12)
Neutro Abs: 9.3 10*3/uL — ABNORMAL HIGH (ref 1.7–7.7)
Neutrophils Relative %: 82 % — ABNORMAL HIGH (ref 43–77)
Platelets: 294 10*3/uL (ref 150–400)
RBC: 4.58 MIL/uL (ref 4.22–5.81)
RDW: 14.8 % (ref 11.5–15.5)
WBC: 11.4 10*3/uL — ABNORMAL HIGH (ref 4.0–10.5)

## 2014-07-08 NOTE — Progress Notes (Signed)
Subjective:    Patient ID: Gary Ortega, male    DOB: 1925-05-31, 80 y.o.   MRN: [id number]  HPI  Patient brought in by daughter [facility]) for ongoing c/o fatigue, weakness, and 2 recent falls associated with "poor balance" w/o associated LOC or cardiac sx's. Patient does have Parkinson's Dz and has a slow broad-based gait.  No vertigo , but does report mild postural light headedness with rapid standing after prolonged sitting or lying. Random BP's have been normal by  his daughters.   Medication Sig  . amLODipine (NORVASC) 5 MG tablet TAKE ONE TABLET BY MOUTH AT BEDTIME FOR BLOOD PRESSURE  . carbidopa-levodopa (SINEMET IR) 25-250 MG per tablet TAKE 1 TABLET TWICE A DAY. (TAKES 1 TABLET AT AT      BEDTIME)  . Cholecalciferol (VITAMIN D3) 5000 UNITS CAPS Take by mouth daily.    . clopidogrel (PLAVIX) 75 MG tablet Take 1 tablet (75 mg total) by mouth once.  . Fish Oil OIL Take by mouth daily. 1 tablespoon   . metoprolol succinate (TOPROL-XL) 25 MG 24 hr tablet Take 25 mg by mouth daily.  . multivitamin (THERAGRAN) per tablet Take 1 tablet by mouth daily.    . nitroGLYCERIN (NITROSTAT) 0.4 MG SL tablet Place 1 tablet (0.4 mg total) under the tongue every 5 (five) minutes as needed for chest pain. One tablet under tongue at onset of chest pain; may repeat every 5 minutes for up to 3 doses  . vitamin C (ASCORBIC ACID) 500 MG tablet Take 500 mg by mouth daily.  . fenofibrate micronized (LOFIBRA) 134 MG capsule Take 1 capsule (134 mg total) by mouth daily. (Patient not taking: Reported on 07/08/2014)  . pravastatin (PRAVACHOL) 40 MG tablet TAKE ONE TABLET BY MOUTH AT BEDTIME FOR CHOLESTEROL (Patient not taking: Reported on 07/08/2014)   Allergies  Allergen Reactions  . Ace Inhibitors   . Lisinopril     Affects kidney  . Nsaids   . Wellbutrin [Bupropion]     confusion   Past Medical History  Diagnosis Date  . Hypertension   . Fatigue   . Shortness of breath   . Chest pain   .  Vitamin D deficiency     unspec.  Marland Kitchen Anxiety   . Depressed   . Hyperlipidemia   . BPH (benign prostatic hypertrophy) with urinary obstruction   . History of kidney stones   . H/O hemorrhoids    Review of Systems  In addition to the HPI above,  No Fever-chills,  No Headache, No changes with Vision or hearing,  No problems swallowing food or Liquids,  No Chest pain or productive Cough or Shortness of Breath,  No Abdominal pain, No Nausea or Vomitting, Bowel movements are regular,  No Blood in stool or Urine,  No dysuria,  No new skin rashes or bruises,  No new joints pains-aches,  No recent weight loss,  No polyuria, polydypsia or polyphagia,  A full 10 point Review of Systems was done, except as stated above, all other Review of Systems were negative    Objective:   Physical Exam        BP 126/68    Pulse 76  Temp 98.2 F   Resp 16  Ht 5\' 7"    Wt 160 lb   BMI 25.08  HEENT - Eac's patent. TM's Nl. EOM's full. PERRLA. NasoOroPharynx clear. Neck - supple. Nl Thyroid. Carotids 2+ & No bruits, nodes, JVD Chest - Clear equal BS w/o Rales,  rhonchi, wheezes. Cor - Nl HS. RRR w/o sig MGR. PP 1(+). No edema. Abd - No palpable organomegaly, masses or tenderness. BS nl. MS- FROM w/o deformities. Muscle power, tone and bulk Nl. Gait slow & broad based with shuffling.  Neuro - No obvious Cr N abnormalities. Sensory, motor and Cerebellar functions appear Nl w/o focal abnormalities. Masked facies . (+) pill-rolling  tremor (R>L).  Psyche - Flat depressed affect.  No delusions, ideations or obvious mood abnormalities.    Assessment & Plan:   1. Weakness  2. Essential hypertension  3. Recurrent falls while walking  4. Medication management  - CBC with Differential - BASIC METABOLIC PANEL WITH GFR  - Given info on Fall Prevention/Precautions to patient & daughter.

## 2014-07-08 NOTE — Patient Instructions (Signed)
It is important to avoid accidents which may result in broken bones.  Here are a few ideas on how to make your home safer so you will be less likely to trip or fall.  1. Use nonskid mats or non slip strips in your shower or tub, on your bathroom floor and around sinks.  If you know that you have spilled water, wipe it up! 2. In the bathroom, it is important to have properly installed grab bars on the walls or on the edge of the tub.  Towel racks are NOT strong enough for you to hold onto or to pull on for support. 3. Stairs and hallways should have enough light.  Add lamps or night lights if you need ore light. 4. It is good to have handrails on both sides of the stairs if possible.  Always fix broken handrails right away. 5. It is important to see the edges of steps.  Paint the edges of outdoor steps white so you can see them better.  Put colored tape on the edge of inside steps. 6. Throw-rugs are dangerous because they can slide.  Removing the rugs is the best idea, but if they must stay, add adhesive carpet tape to prevent slipping. 7. Do not keep things on stairs or in the halls.  Remove small furniture that blocks the halls as it may cause you to trip.  Keep telephone and electrical cords out of the way where you walk. 8. Always were sturdy, rubber-soled shoes for good support.  Never wear just socks, especially on the stairs.  Socks may cause you to slip or fall.  Do not wear full-length housecoats as you can easily trip on the bottom.  9. Place the things you use the most on the shelves that are the easiest to reach.  If you use a stepstool, make sure it is in good condition.  If you feel unsteady, DO NOT climb, ask for help. 10. If a health professional advises you to use a cane or walker, do not be ashamed.  These items can keep you from falling and breaking your bones.  Preventing Falls and Fractures  Falls can be very serious, especially for older adults or people with  osteoporosis  Falls can be caused by:  Tripping or slipping  Slow reflexes  Balance problems  Reduced muscle strength  Poor vision or a recent change in prescription  Illness and some medications (especially blood pressure pills, diuretics, heart medicines, muscle relaxants and sleep medications)  Drinking alcohol  To prevent falls outdoors:  Use a can or walker if needed  Wear rubber-soled shoes so you don't slip  DO NOT buy "shape up" shoes with rocker bottom soles if you have balance problems.  The thick soles and shape make it more difficult to keep your balance.  Put kitty litter or salt on icy sidewalks  Walk on the grass if the sidewalks are slick  Avoid walking on uneven ground whenever possible  T prevent falls indoors:  Keep rooms clutter-free, especially hallways, stairs and paths to light switches  Remove throw rugs  Install night lights, especially to and in the bathroom  Turn on lights before going downstairs  Keep a flashlight next to your bed  Buy a cordless phone to keep with you instead of jumping up to answer the phone  Install grab bars in the bathroom near the shower and toilet  Install rails on both sides of the stairs.  Make sure the stairs are  well lit  Wear slippers with non-skid soles.  Do not walk around in stockings or socks  Balance problems and dizziness are not a normal part of growing older.  If you begin having balance problems or dizziness see your doctor.  Physical Therapy can help you with many balance problems, strengthening hip and leg muscles and with gait training.  To keep your bones healthy make sure you are getting enough calcium and Vitamin D each day.  Ask your doctor or pharmacist about supplements.  Regular weight-bearing exercise like walking, lifting weights or dancing can help strengthen bones and prevent osteoporosis.   Fall Prevention and Home Safety Falls cause injuries and can affect all age groups. It is  possible to prevent falls.  HOW TO PREVENT FALLS 8. Wear shoes with rubber soles that do not have an opening for your toes. 9. Keep the inside and outside of your house well lit. 10. Use night lights throughout your home. 11. Remove clutter from floors. 12. Clean up floor spills. 13. Remove throw rugs or fasten them to the floor with carpet tape. 14. Do not place electrical cords across pathways. 15. Put grab bars by your tub, shower, and toilet. Do not use towel bars as grab bars. 16. Put handrails on both sides of the stairway. Fix loose handrails. 17. Do not climb on stools or stepladders, if possible. 18. Do not wax your floors. 19. Repair uneven or unsafe sidewalks, walkways, or stairs. 20. Keep items you use a lot within reach. 21. Be aware of pets. 22. Keep emergency numbers next to the telephone. 23. Put smoke detectors in your home and near bedrooms. Ask your doctor what other things you can do to prevent falls. Document Released: 05/07/2009 Document Revised: 01/10/2012 Document Reviewed: 10/11/2011 [facility] Patient Information 2015 [place], [place]. This information is not intended to replace advice given to you by your health care provider. Make sure you discuss any questions you have with your health care provider.   Fall Prevention and Home Safety Falls cause injuries and can affect all age groups. It is possible to use preventive measures to significantly decrease the likelihood of falls. There are many simple measures which can make your home safer and prevent falls. OUTDOORS 24. Repair cracks and edges of walkways and driveways. 25. Remove high doorway thresholds. 26. Trim shrubbery on the main path into your home. 27. Have good outside lighting. 28. Clear walkways of tools, rocks, debris, and clutter. 29. Check that handrails are not broken and are securely fastened. Both sides of steps should have handrails. 30. Have leaves, snow, and ice cleared regularly. 31. Use  sand or salt on walkways during winter months. 32. In the garage, clean up grease or oil spills. BATHROOM 7. Install night lights. 8. Install grab bars by the toilet and in the tub and shower. 9. Use non-skid mats or decals in the tub or shower. 10. Place a plastic non-slip stool in the shower to sit on, if needed. 11. Keep floors dry and clean up all water on the floor immediately. 12. Remove soap buildup in the tub or shower on a regular basis. 13. Secure bath mats with non-slip, double-sided rug tape. 14. Remove throw rugs and tripping hazards from the floors. BEDROOMS 10. Install night lights. 11. Make sure a bedside light is easy to reach. 12. Do not use oversized bedding. 77. Keep a telephone by your bedside. 14. Have a firm chair with side arms to use for getting dressed. 15. Remove  throw rugs and tripping hazards from the floor. KITCHEN  Keep handles on pots and pans turned toward the center of the stove. Use back burners when possible.  Clean up spills quickly and allow time for drying.  Avoid walking on wet floors.  Avoid hot utensils and knives.  Position shelves so they are not too high or low.  Place commonly used objects within easy reach.  If necessary, use a sturdy step stool with a grab bar when reaching.  Keep electrical cables out of the way.  Do not use floor polish or wax that makes floors slippery. If you must use wax, use non-skid floor wax.  Remove throw rugs and tripping hazards from the floor. STAIRWAYS  Never leave objects on stairs.  Place handrails on both sides of stairways and use them. Fix any loose handrails. Make sure handrails on both sides of the stairways are as long as the stairs.  Check carpeting to make sure it is firmly attached along stairs. Make repairs to worn or loose carpet promptly.  Avoid placing throw rugs at the top or bottom of stairways, or properly secure the rug with carpet tape to prevent slippage. Get rid of throw  rugs, if possible.  Have an electrician put in a light switch at the top and bottom of the stairs. OTHER FALL PREVENTION TIPS  Wear low-heel or rubber-soled shoes that are supportive and fit well. Wear closed toe shoes.  When using a stepladder, make sure it is fully opened and both spreaders are firmly locked. Do not climb a closed stepladder.  Add color or contrast paint or tape to grab bars and handrails in your home. Place contrasting color strips on first and last steps.  Learn and use mobility aids as needed. Install an electrical emergency response system.  Turn on lights to avoid dark areas. Replace light bulbs that burn out immediately. Get light switches that glow.  Arrange furniture to create clear pathways. Keep furniture in the same place.  Firmly attach carpet with non-skid or double-sided tape.  Eliminate uneven floor surfaces.  Select a carpet pattern that does not visually hide the edge of steps.  Be aware of all pets. OTHER HOME SAFETY TIPS  Set the water temperature for 120 F (48.8 C).  Keep emergency numbers on or near the telephone.  Keep smoke detectors on every level of the home and near sleeping areas. Document Released: 07/01/2002 Document Revised: 01/10/2012 Document Reviewed: 09/30/2011 [facility] Patient Information 2015 [place], [place]. This information is not intended to replace advice given to you by your health care provider. Make sure you discuss any questions you have with your health care provider.

## 2014-08-22 ENCOUNTER — Other Ambulatory Visit: Payer: Self-pay | Admitting: Emergency Medicine

## 2014-10-02 ENCOUNTER — Ambulatory Visit: Payer: Self-pay | Admitting: Urology

## 2014-10-02 DIAGNOSIS — Z905 Acquired absence of kidney: Secondary | ICD-10-CM | POA: Diagnosis not present

## 2014-10-02 DIAGNOSIS — Z8546 Personal history of malignant neoplasm of prostate: Secondary | ICD-10-CM | POA: Diagnosis not present

## 2014-10-02 DIAGNOSIS — N2889 Other specified disorders of kidney and ureter: Secondary | ICD-10-CM | POA: Diagnosis not present

## 2014-10-02 DIAGNOSIS — D3002 Benign neoplasm of left kidney: Secondary | ICD-10-CM | POA: Diagnosis not present

## 2014-10-07 ENCOUNTER — Encounter: Payer: Self-pay | Admitting: Physician Assistant

## 2014-10-07 ENCOUNTER — Ambulatory Visit (INDEPENDENT_AMBULATORY_CARE_PROVIDER_SITE_OTHER): Payer: Medicare Other | Admitting: Physician Assistant

## 2014-10-07 VITALS — BP 138/78 | HR 64 | Temp 97.7°F | Resp 16 | Ht 67.0 in | Wt 163.0 lb

## 2014-10-07 DIAGNOSIS — G20A1 Parkinson's disease without dyskinesia, without mention of fluctuations: Secondary | ICD-10-CM

## 2014-10-07 DIAGNOSIS — Z905 Acquired absence of kidney: Secondary | ICD-10-CM

## 2014-10-07 DIAGNOSIS — I10 Essential (primary) hypertension: Secondary | ICD-10-CM

## 2014-10-07 DIAGNOSIS — Z1331 Encounter for screening for depression: Secondary | ICD-10-CM

## 2014-10-07 DIAGNOSIS — R6889 Other general symptoms and signs: Secondary | ICD-10-CM | POA: Diagnosis not present

## 2014-10-07 DIAGNOSIS — E1122 Type 2 diabetes mellitus with diabetic chronic kidney disease: Secondary | ICD-10-CM

## 2014-10-07 DIAGNOSIS — E1129 Type 2 diabetes mellitus with other diabetic kidney complication: Secondary | ICD-10-CM | POA: Diagnosis not present

## 2014-10-07 DIAGNOSIS — Z0001 Encounter for general adult medical examination with abnormal findings: Secondary | ICD-10-CM | POA: Diagnosis not present

## 2014-10-07 DIAGNOSIS — G459 Transient cerebral ischemic attack, unspecified: Secondary | ICD-10-CM

## 2014-10-07 DIAGNOSIS — R29898 Other symptoms and signs involving the musculoskeletal system: Secondary | ICD-10-CM

## 2014-10-07 DIAGNOSIS — N182 Chronic kidney disease, stage 2 (mild): Secondary | ICD-10-CM | POA: Diagnosis not present

## 2014-10-07 DIAGNOSIS — I25810 Atherosclerosis of coronary artery bypass graft(s) without angina pectoris: Secondary | ICD-10-CM

## 2014-10-07 DIAGNOSIS — E782 Mixed hyperlipidemia: Secondary | ICD-10-CM

## 2014-10-07 DIAGNOSIS — Z9181 History of falling: Secondary | ICD-10-CM

## 2014-10-07 DIAGNOSIS — G20 Parkinson's disease: Secondary | ICD-10-CM

## 2014-10-07 DIAGNOSIS — Z79899 Other long term (current) drug therapy: Secondary | ICD-10-CM | POA: Diagnosis not present

## 2014-10-07 DIAGNOSIS — E559 Vitamin D deficiency, unspecified: Secondary | ICD-10-CM

## 2014-10-07 DIAGNOSIS — N138 Other obstructive and reflux uropathy: Secondary | ICD-10-CM

## 2014-10-07 DIAGNOSIS — N401 Enlarged prostate with lower urinary tract symptoms: Secondary | ICD-10-CM

## 2014-10-07 LAB — LIPID PANEL
CHOLESTEROL: 219 mg/dL — AB (ref 0–200)
HDL: 28 mg/dL — ABNORMAL LOW (ref >=40)
LDL CALC: 126 mg/dL — AB (ref 0–99)
Total CHOL/HDL Ratio: 7.8 Ratio
Triglycerides: 323 mg/dL — ABNORMAL HIGH (ref <150)
VLDL: 65 mg/dL — ABNORMAL HIGH (ref 0–40)

## 2014-10-07 LAB — HEPATIC FUNCTION PANEL
ALT: 8 U/L (ref 0–53)
AST: 19 U/L (ref 0–37)
Albumin: 3.9 g/dL (ref 3.5–5.2)
Alkaline Phosphatase: 52 U/L (ref 39–117)
BILIRUBIN DIRECT: 0.1 mg/dL (ref 0.0–0.3)
Indirect Bilirubin: 0.4 mg/dL (ref 0.2–1.2)
Total Bilirubin: 0.5 mg/dL (ref 0.2–1.2)
Total Protein: 6.8 g/dL (ref 6.0–8.3)

## 2014-10-07 LAB — CBC WITH DIFFERENTIAL/PLATELET
Basophils Absolute: 0.1 10*3/uL (ref 0.0–0.1)
Basophils Relative: 1 % (ref 0–1)
EOS ABS: 0.2 10*3/uL (ref 0.0–0.7)
EOS PCT: 3 % (ref 0–5)
HEMATOCRIT: 42 % (ref 39.0–52.0)
HEMOGLOBIN: 14 g/dL (ref 13.0–17.0)
LYMPHS ABS: 1.4 10*3/uL (ref 0.7–4.0)
Lymphocytes Relative: 17 % (ref 12–46)
MCH: 29.4 pg (ref 26.0–34.0)
MCHC: 33.3 g/dL (ref 30.0–36.0)
MCV: 88.1 fL (ref 78.0–100.0)
MONO ABS: 0.5 10*3/uL (ref 0.1–1.0)
MONOS PCT: 6 % (ref 3–12)
MPV: 9.5 fL (ref 8.6–12.4)
Neutro Abs: 5.8 10*3/uL (ref 1.7–7.7)
Neutrophils Relative %: 73 % (ref 43–77)
Platelets: 254 10*3/uL (ref 150–400)
RBC: 4.77 MIL/uL (ref 4.22–5.81)
RDW: 14.2 % (ref 11.5–15.5)
WBC: 8 10*3/uL (ref 4.0–10.5)

## 2014-10-07 LAB — BASIC METABOLIC PANEL WITH GFR
BUN: 38 mg/dL — ABNORMAL HIGH (ref 6–23)
CALCIUM: 9.6 mg/dL (ref 8.4–10.5)
CO2: 27 meq/L (ref 19–32)
CREATININE: 2.18 mg/dL — AB (ref 0.50–1.35)
Chloride: 102 mEq/L (ref 96–112)
GFR, Est African American: 30 mL/min — ABNORMAL LOW
GFR, Est Non African American: 26 mL/min — ABNORMAL LOW
GLUCOSE: 97 mg/dL (ref 70–99)
Potassium: 4.5 mEq/L (ref 3.5–5.3)
SODIUM: 138 meq/L (ref 135–145)

## 2014-10-07 LAB — TSH: TSH: 1.449 u[IU]/mL (ref 0.350–4.500)

## 2014-10-07 LAB — MAGNESIUM: MAGNESIUM: 2.2 mg/dL (ref 1.5–2.5)

## 2014-10-07 MED ORDER — METOPROLOL SUCCINATE ER 25 MG PO TB24: ORAL_TABLET | ORAL | Status: DC

## 2014-10-07 NOTE — Progress Notes (Signed)
MEDICARE ANNUAL WELLNESS VISIT AND FOLLOW UP Assessment:   1. Essential hypertension - continue medications, DASH diet, exercise and monitor at home. Call if greater than 130/80.  - CBC with Differential/Platelet - BASIC METABOLIC PANEL WITH GFR - Hepatic function panel - TSH  2. Coronary artery disease involving coronary bypass graft of native heart without angina pectoris Control blood pressure, cholesterol, glucose, increase exercise.  controlled  3. CKD stage 2 due to type 2 diabetes mellitus Discussed general issues about diabetes pathophysiology and management., Educational material distributed., Suggested low cholesterol diet., Encouraged aerobic exercise., Discussed foot care., Reminded to get yearly retinal exam. - Hemoglobin A1c - HM DIABETES FOOT EXAM  4. BPH (benign prostatic hypertrophy) with urinary obstruction monitor  5. Parkinson's disease Continue sinemet - Ambulatory referral to [place]  6. Hyperlipidemia -off meds, check lipids, decrease fatty foods, increase activity.  - Lipid panel  7. Medication management - Magnesium  8. Vitamin D deficiency  9. History of nephrectomy, unilateral Monitor BMP  10. Leg weakness, bilateral ? Spinal stenosis- will do PT due to pain/fall risk - Ambulatory referral to [place]. Transient cerebral ischemia, unspecified transient cerebral ischemia type Control blood pressure, cholesterol, glucose, increase exercise.  Continue plavis  12. At high risk for falls - Ambulatory referral to [place]. Health Maintenance Needs prevnar 13 DUE, out of in the office.   Plan:   During the course of the visit the patient was educated and counseled about appropriate screening and preventive services including:    Pneumococcal vaccine   Influenza vaccine  Td vaccine  Screening electrocardiogram  Colorectal cancer screening  Diabetes screening  Glaucoma screening  Nutrition counseling    Conditions/risks identified: BMI: Discussed weight loss, diet, and increase physical activity.  Increase physical activity: AHA recommends 150 minutes of physical activity a week.  Medications reviewed Diabetes is at goal, ACE/ARB therapy: Yes. Urinary Incontinence is an issue: discussed non pharmacology and pharmacology options.  Fall risk: high- discussed PT, home fall assessment, medications.    Subjective:  Gary Ortega is a 81 y.o. male who presents for Medicare Annual Wellness Visit and 3 month follow up for HTN, hyperlipidemia, prediabetes, and vitamin D Def.  Date of last medicare wellness visit was 10/08/2013  His blood pressure has been fluctuating at  Home, his daughter is here with him she states recently his BP has been running as high as 180's, he was on 2.5 of norvasc but it was increased to 5mg  on Saturday and he is on metoprolol 50mg  but actually takes 12.5, today their BP is BP: 138/78 mmHg He does not workout regularly, walks occ.  He denies chest pain, shortness of breath, dizziness.  He has history of CKD due to HTN and DM as well as s/p nephrectomy due to tumor, last GFR was 24, BUN 33 and Cr 2.36.  He was following with Dr. Clover Mealy but he has since been released. He does follow up with Dr. Shona Needles and had recent scan of his other kidney.  Patient has a history of TIA in 2010 and CVA in 2012 without sequelae, he is on plavix, follows with Dr. Johnsie Cancel. He has NTG but has not used it. He has a history of CAD, had 50-60% mod CAD via cath in 2012.  He is not on cholesterol medication, he was discontinued last visit.His cholesterol is at goal. The cholesterol last visit was:   Lab Results  Component Value Date   CHOL 180  03/27/2014   HDL 37* 03/27/2014   LDLCALC 96 03/27/2014   TRIG 237* 03/27/2014   CHOLHDL 4.9 03/27/2014   He has been working on diet and exercise for diabetes with CKD stage 4 since 2010, he has done a good job with diet, and denies  paresthesia of the feet, polydipsia, polyuria and visual disturbances. Last A1C in the office was:  Lab Results  Component Value Date   HGBA1C 6.2* 03/27/2014  Patient is on Vitamin D supplement.   Lab Results  Component Value Date   VD25OH 16 03/27/2014   He has parkinson's and is on sinemet.   He walks with a cane, has leg weakness, normal CPK, and was on statin but was taken off, no change in leg weakness.   Medication Review: Current Outpatient Prescriptions on File Prior to Visit  Medication Sig Dispense Refill  . amLODipine (NORVASC) 5 MG tablet TAKE ONE TABLET BY MOUTH AT BEDTIME FOR BLOOD PRESSURE 90 tablet 3  . carbidopa-levodopa (SINEMET IR) 25-250 MG per tablet TAKE 1 TABLET TWICE A DAY. (TAKES 1 TABLET AT AT      BEDTIME) 180 tablet 1  . Cholecalciferol (VITAMIN D3) 5000 UNITS CAPS Take by mouth daily.      . clopidogrel (PLAVIX) 75 MG tablet TAKE 1 TABLET DAILY TO     PREVENT STROKE 90 tablet 3  . Fish Oil OIL Take by mouth daily. 1 tablespoon     . metoprolol succinate (TOPROL-XL) 25 MG 24 hr tablet Take 25 mg by mouth daily.    . multivitamin (THERAGRAN) per tablet Take 1 tablet by mouth daily.      . nitroGLYCERIN (NITROSTAT) 0.4 MG SL tablet Place 1 tablet (0.4 mg total) under the tongue every 5 (five) minutes as needed for chest pain. One tablet under tongue at onset of chest pain; may repeat every 5 minutes for up to 3 doses 30 tablet PRN  . vitamin C (ASCORBIC ACID) 500 MG tablet Take 500 mg by mouth daily.     No current facility-administered medications on file prior to visit.    Current Problems (verified) Patient Active Problem List   Diagnosis Date Noted  . History of nephrectomy, unilateral 10/07/2014  . CKD stage 2 due to type 2 diabetes mellitus 03/29/2014  . Parkinson's disease 03/27/2014  . Medication management 11/29/2013  . BPH (benign prostatic hypertrophy) with urinary obstruction   . ASCAD 06/30/2011  . TIA (transient ischemic attack) 06/30/2011   . Hyperlipidemia 06/03/2010  . Vitamin D deficiency 06/02/2010  . Essential hypertension 06/02/2010    Screening Tests Immunization History  Administered Date(s) Administered  . Influenza Split 04/04/2013  . Influenza, High Dose Seasonal PF 03/27/2014  . Pneumococcal Polysaccharide-23 10/08/2013  . Pneumococcal-Unspecified 07/25/2002  . Td 07/25/2006   Preventative care: Last colonoscopy: 2007 CT AB: 09/2013 CT head 01/2011 CXR 2012 Lumbar MRI 2015 Cath 2012- moderate LAD 50-60%  Prior vaccinations: TD or Tdap: 2008  Influenza: 2015 Pneumococcal: 2015 Prevnar13: out of in the office Shingles/Zostavax: declines due to cost  Names of Other Physician/Practitioners you currently use: 1. [facility] here for primary care 2. [facility], eye doctor, last visit April 2015, wears glasses Patient Care Team: Unk Pinto, MD as PCP - General Irine Seal, MD as Attending Physician (Urology) Franchot Gallo, MD as Consulting Physician (Urology) Josue Hector, MD as Consulting Physician (Cardiology) Conrad Barry, MD as Consulting Physician (Vascular Surgery) Laurence Spates, MD as Consulting Physician (  Gastroenterology)  Past Surgical History  Procedure Laterality Date  . Colonoscopy    . Cystoscopy      prostate radioactive I-125 seed impklantation   . Mandible fracture surgery  1948  . Appendectomy    . Hemorrhoid surgery    . Cataract extraction     Family History  Problem Relation Age of Onset  . Stroke Mother   . Hypertension Mother   . Stroke Father   . Hypertension Brother    History  Substance Use Topics  . Smoking status: Never Smoker   . Smokeless tobacco: Not on file  . Alcohol Use: No    MEDICARE WELLNESS OBJECTIVES: Tobacco use: He does not smoke.  Patient is not a former smoker. Alcohol Current alcohol use: none Caffeine Current caffeine use: coffee 1-2 /day Diet: in general, a "healthy" diet    Physical activity: walking and no regular exercise Fall risk:  High Risk, has life alert Osteoporosis: dietary calcium and/or vitamin D deficiency, History of fracture in the past year: no Depression/mood screen:  Yes - No Depression Hearing: normal Visual acuity: normal,  does perform annual eye exam  ADLs: self care Home safety: good Cognitive Testing  Alert? Yes  Normal Appearance?Yes  Oriented to person? Yes  Place? Yes   Time? Yes  Recall of three objects?  No  Can perform simple calculations? No  Displays appropriate judgment?Yes  Can read the correct time from a watch face?Yes EOL planning: Yes   Objective:   Blood pressure 138/78, pulse 64, temperature 97.7 F (36.5 C), resp. rate 16, height 5\' 7"  (1.702 m), weight 163 lb (73.936 kg). Body mass index is 25.52 kg/(m^2).  HEENT: normocephalic, sclerae anicteric, TMs pearly, nares patent, no discharge or erythema, pharynx normal Oral cavity: MMM, no lesions Neck: supple, no lymphadenopathy, no thyromegaly, no masses Heart: RRR, normal S1, S2, no murmurs Lungs: CTA bilaterally, no wheezes, rhonchi, or rales Abdomen: +bs, soft, mild tenderness diffuse lower AB, non distended, no masses, no hepatomegaly, no splenomegaly Musculoskeletal: nontender, no swelling, no obvious deformity, mild discomfort with lumbar l4-5 area with change of position Extremities: no edema, no cyanosis, no clubbing Pulses: 2+ symmetric, upper and lower extremities, normal cap refill Neurological: alert, oriented x 3, CN2-12 intact, strength normal upper extremities and lower extremities, sensation normal throughout, pill rolling resting tremor, antalgic, slow broad based gait with cane.  Psychiatric: flat affect behavior normal, pleasant  Medicare Attestation I have personally reviewed: The patient's medical and social history Their use of alcohol, tobacco or illicit drugs Their current medications and supplements The patient's functional ability  including ADLs,fall risks, home safety risks, cognitive, and hearing and visual impairment Diet and physical activities Evidence for depression or mood disorders  The patient's weight, height, BMI, and visual acuity have been recorded in the chart.  I have made referrals, counseling, and provided education to the patient based on review of the above and I have provided the patient with a written personalized care plan for preventive services.     Vicie Mutters, PA-C   10/07/2014

## 2014-10-07 NOTE — Patient Instructions (Addendum)
Sit for 5 mins before you take your blood pressure and make sure you arm/wrist is at heart level.  Please monitor your blood pressure, as we get older our body can not respond to a low blood pressure as well as it did when we were younger, for this reason we want a bit higher of a blood pressure as you get older to avoid dizziness and fatigue which can lead to falls. [place] call if your blood pressure is consistently above 160/90.   i would suggest trying to give the norvasc in the AM 5mg  and the metoprolol at night, do it at the 12.5 for a few days BUT may have to increase to the 25mg  or 1/2 of the pill, monitor HR and BP.    Spinal Stenosis Spinal stenosis is an abnormal narrowing of the canals of your spine (vertebrae). CAUSES  Spinal stenosis is caused by areas of bone pushing into the central canals of your vertebrae. This condition can be present at birth (congenital). It also may be caused by arthritic deterioration of your vertebrae (spinal degeneration).  SYMPTOMS   Pain that is generally worse with activities, particularly standing and walking.  Numbness, tingling, hot or cold sensations, weakness, or weariness in your legs.  Frequent episodes of falling.  A foot-slapping gait that leads to muscle weakness. DIAGNOSIS  Spinal stenosis is diagnosed with the use of magnetic resonance imaging (MRI) or computed tomography (CT). TREATMENT  Initial therapy for spinal stenosis focuses on the management of the pain and other symptoms associated with the condition. These therapies include:  Practicing postural changes to lessen pressure on your nerves.  Exercises to strengthen the core of your body.  Loss of excess body weight.  The use of nonsteroidal anti-inflammatory medicines to reduce swelling and inflammation in your nerves. When therapies to manage pain are not successful, surgery to treat spinal stenosis may be recommended. This surgery involves removing excess bone, which puts  pressure on your nerve roots. During this surgery (laminectomy), the posterior boney arch (lamina) and excess bone around the facet joints are removed. Document Released: 10/01/2003 Document Revised: 11/25/2013 Document Reviewed: 10/19/2012 [facility] Patient Information 2015 [place], [place]. This information is not intended to replace advice given to you by your health care provider. Make sure you discuss any questions you have with your health care provider.

## 2014-10-08 ENCOUNTER — Other Ambulatory Visit: Payer: Self-pay

## 2014-10-08 LAB — HEMOGLOBIN A1C
Hgb A1c MFr Bld: 6 % — ABNORMAL HIGH (ref <5.7)
MEAN PLASMA GLUCOSE: 126 mg/dL — AB (ref <117)

## 2014-10-08 MED ORDER — CARBIDOPA-LEVODOPA 25-250 MG PO TABS: ORAL_TABLET | ORAL | Status: DC

## 2014-10-17 DIAGNOSIS — Z7902 Long term (current) use of antithrombotics/antiplatelets: Secondary | ICD-10-CM | POA: Diagnosis not present

## 2014-10-17 DIAGNOSIS — I25810 Atherosclerosis of coronary artery bypass graft(s) without angina pectoris: Secondary | ICD-10-CM | POA: Diagnosis not present

## 2014-10-17 DIAGNOSIS — Z9181 History of falling: Secondary | ICD-10-CM | POA: Diagnosis not present

## 2014-10-17 DIAGNOSIS — M4800 Spinal stenosis, site unspecified: Secondary | ICD-10-CM

## 2014-10-17 DIAGNOSIS — N400 Enlarged prostate without lower urinary tract symptoms: Secondary | ICD-10-CM | POA: Diagnosis not present

## 2014-10-17 DIAGNOSIS — I129 Hypertensive chronic kidney disease with stage 1 through stage 4 chronic kidney disease, or unspecified chronic kidney disease: Secondary | ICD-10-CM | POA: Diagnosis not present

## 2014-10-17 DIAGNOSIS — Z905 Acquired absence of kidney: Secondary | ICD-10-CM | POA: Diagnosis not present

## 2014-10-17 DIAGNOSIS — Z8673 Personal history of transient ischemic attack (TIA), and cerebral infarction without residual deficits: Secondary | ICD-10-CM | POA: Diagnosis not present

## 2014-10-17 DIAGNOSIS — E1122 Type 2 diabetes mellitus with diabetic chronic kidney disease: Secondary | ICD-10-CM

## 2014-10-17 DIAGNOSIS — N182 Chronic kidney disease, stage 2 (mild): Secondary | ICD-10-CM | POA: Diagnosis not present

## 2014-10-17 DIAGNOSIS — G20 Parkinson's disease: Secondary | ICD-10-CM

## 2014-10-21 ENCOUNTER — Ambulatory Visit: Payer: Self-pay | Admitting: Internal Medicine

## 2014-10-21 ENCOUNTER — Telehealth: Payer: Self-pay | Admitting: *Deleted

## 2014-10-21 NOTE — Telephone Encounter (Signed)
OK for Pt 2 times a week x 6 weeks per Dr Melford Aase.  Nate aware.

## 2014-10-27 DIAGNOSIS — N182 Chronic kidney disease, stage 2 (mild): Secondary | ICD-10-CM | POA: Diagnosis not present

## 2014-10-27 DIAGNOSIS — I25810 Atherosclerosis of coronary artery bypass graft(s) without angina pectoris: Secondary | ICD-10-CM | POA: Diagnosis not present

## 2014-10-27 DIAGNOSIS — G20 Parkinson's disease: Secondary | ICD-10-CM | POA: Diagnosis not present

## 2014-10-27 DIAGNOSIS — M4800 Spinal stenosis, site unspecified: Secondary | ICD-10-CM | POA: Diagnosis not present

## 2014-10-27 DIAGNOSIS — E1122 Type 2 diabetes mellitus with diabetic chronic kidney disease: Secondary | ICD-10-CM | POA: Diagnosis not present

## 2014-10-27 DIAGNOSIS — I129 Hypertensive chronic kidney disease with stage 1 through stage 4 chronic kidney disease, or unspecified chronic kidney disease: Secondary | ICD-10-CM | POA: Diagnosis not present

## 2014-10-28 ENCOUNTER — Ambulatory Visit (INDEPENDENT_AMBULATORY_CARE_PROVIDER_SITE_OTHER): Payer: Medicare Other | Admitting: Urology

## 2014-10-28 DIAGNOSIS — N281 Cyst of kidney, acquired: Secondary | ICD-10-CM | POA: Diagnosis not present

## 2014-10-28 DIAGNOSIS — C61 Malignant neoplasm of prostate: Secondary | ICD-10-CM

## 2014-10-28 DIAGNOSIS — N3281 Overactive bladder: Secondary | ICD-10-CM | POA: Diagnosis not present

## 2014-10-29 ENCOUNTER — Other Ambulatory Visit: Payer: Self-pay

## 2014-10-29 MED ORDER — CARBIDOPA-LEVODOPA 25-250 MG PO TABS: ORAL_TABLET | ORAL | Status: DC

## 2014-10-30 DIAGNOSIS — M4800 Spinal stenosis, site unspecified: Secondary | ICD-10-CM | POA: Diagnosis not present

## 2014-10-30 DIAGNOSIS — I25810 Atherosclerosis of coronary artery bypass graft(s) without angina pectoris: Secondary | ICD-10-CM | POA: Diagnosis not present

## 2014-10-30 DIAGNOSIS — G20 Parkinson's disease: Secondary | ICD-10-CM | POA: Diagnosis not present

## 2014-10-30 DIAGNOSIS — N182 Chronic kidney disease, stage 2 (mild): Secondary | ICD-10-CM | POA: Diagnosis not present

## 2014-10-30 DIAGNOSIS — I129 Hypertensive chronic kidney disease with stage 1 through stage 4 chronic kidney disease, or unspecified chronic kidney disease: Secondary | ICD-10-CM | POA: Diagnosis not present

## 2014-10-30 DIAGNOSIS — E1122 Type 2 diabetes mellitus with diabetic chronic kidney disease: Secondary | ICD-10-CM | POA: Diagnosis not present

## 2014-10-31 ENCOUNTER — Telehealth: Payer: Self-pay

## 2014-10-31 ENCOUNTER — Other Ambulatory Visit: Payer: Self-pay

## 2014-10-31 MED ORDER — AZITHROMYCIN 250 MG PO TABS: ORAL_TABLET | ORAL | Status: AC

## 2014-10-31 NOTE — Telephone Encounter (Signed)
Received paper note from front office staff, patients daughter [name] called to advise, patient c/o cough, sinus and cold sx for approx a week, states has tried OTC products with little relief, per Dr Melford Aase sent in [place] with one refill as there are no available office appointments today. Gary Ortega is aware that if he is not better after treatment he will need to schedule office visit for evaluation.

## 2014-11-02 ENCOUNTER — Inpatient Hospital Stay
Admit: 2014-11-02 | Disposition: A | Discharge status: Home / Self Care | Payer: Self-pay | Attending: Internal Medicine | Admitting: Internal Medicine

## 2014-11-02 DIAGNOSIS — N183 Chronic kidney disease, stage 3 (moderate): Secondary | ICD-10-CM | POA: Diagnosis not present

## 2014-11-02 DIAGNOSIS — I129 Hypertensive chronic kidney disease with stage 1 through stage 4 chronic kidney disease, or unspecified chronic kidney disease: Secondary | ICD-10-CM | POA: Diagnosis not present

## 2014-11-02 DIAGNOSIS — R410 Disorientation, unspecified: Secondary | ICD-10-CM | POA: Diagnosis not present

## 2014-11-02 DIAGNOSIS — G40802 Other epilepsy, not intractable, without status epilepticus: Secondary | ICD-10-CM | POA: Diagnosis not present

## 2014-11-02 DIAGNOSIS — R269 Unspecified abnormalities of gait and mobility: Secondary | ICD-10-CM | POA: Diagnosis not present

## 2014-11-02 DIAGNOSIS — Z8546 Personal history of malignant neoplasm of prostate: Secondary | ICD-10-CM | POA: Diagnosis not present

## 2014-11-02 DIAGNOSIS — R251 Tremor, unspecified: Secondary | ICD-10-CM | POA: Diagnosis not present

## 2014-11-02 DIAGNOSIS — C649 Malignant neoplasm of unspecified kidney, except renal pelvis: Secondary | ICD-10-CM | POA: Diagnosis not present

## 2014-11-02 DIAGNOSIS — R262 Difficulty in walking, not elsewhere classified: Secondary | ICD-10-CM | POA: Diagnosis present

## 2014-11-02 DIAGNOSIS — G20 Parkinson's disease: Secondary | ICD-10-CM | POA: Diagnosis not present

## 2014-11-02 DIAGNOSIS — J019 Acute sinusitis, unspecified: Secondary | ICD-10-CM | POA: Diagnosis not present

## 2014-11-02 DIAGNOSIS — Z7902 Long term (current) use of antithrombotics/antiplatelets: Secondary | ICD-10-CM | POA: Diagnosis not present

## 2014-11-02 DIAGNOSIS — M6282 Rhabdomyolysis: Secondary | ICD-10-CM | POA: Diagnosis not present

## 2014-11-02 DIAGNOSIS — S0990XA Unspecified injury of head, initial encounter: Secondary | ICD-10-CM | POA: Diagnosis not present

## 2014-11-02 DIAGNOSIS — Z9181 History of falling: Secondary | ICD-10-CM | POA: Diagnosis not present

## 2014-11-02 DIAGNOSIS — R531 Weakness: Secondary | ICD-10-CM | POA: Diagnosis not present

## 2014-11-02 DIAGNOSIS — D329 Benign neoplasm of meninges, unspecified: Secondary | ICD-10-CM | POA: Diagnosis not present

## 2014-11-02 DIAGNOSIS — Z8673 Personal history of transient ischemic attack (TIA), and cerebral infarction without residual deficits: Secondary | ICD-10-CM | POA: Diagnosis not present

## 2014-11-02 DIAGNOSIS — I10 Essential (primary) hypertension: Secondary | ICD-10-CM | POA: Diagnosis not present

## 2014-11-02 DIAGNOSIS — J209 Acute bronchitis, unspecified: Secondary | ICD-10-CM | POA: Diagnosis not present

## 2014-11-02 DIAGNOSIS — Z79899 Other long term (current) drug therapy: Secondary | ICD-10-CM | POA: Diagnosis not present

## 2014-11-02 DIAGNOSIS — R05 Cough: Secondary | ICD-10-CM | POA: Diagnosis not present

## 2014-11-02 DIAGNOSIS — I6789 Other cerebrovascular disease: Secondary | ICD-10-CM | POA: Diagnosis not present

## 2014-11-02 DIAGNOSIS — R7989 Other specified abnormal findings of blood chemistry: Secondary | ICD-10-CM | POA: Diagnosis not present

## 2014-11-02 LAB — URINALYSIS, COMPLETE
BACTERIA: NONE SEEN
Bilirubin,UR: NEGATIVE
Ketone: NEGATIVE
LEUKOCYTE ESTERASE: NEGATIVE
Nitrite: NEGATIVE
Ph: 6 (ref 4.5–8.0)
Protein: 100
Specific Gravity: 1.014 (ref 1.003–1.030)
Squamous Epithelial: NONE SEEN

## 2014-11-02 LAB — COMPREHENSIVE METABOLIC PANEL
ALBUMIN: 2.9 g/dL — AB
Alkaline Phosphatase: 65 U/L
Anion Gap: 9 (ref 7–16)
BUN: 51 mg/dL — ABNORMAL HIGH
Bilirubin,Total: 0.1 mg/dL — ABNORMAL LOW
Calcium, Total: 8.8 mg/dL — ABNORMAL LOW
Chloride: 106 mmol/L
Co2: 24 mmol/L
Creatinine: 2.49 mg/dL — ABNORMAL HIGH
EGFR (African American): 26 — ABNORMAL LOW
EGFR (Non-African Amer.): 22 — ABNORMAL LOW
Glucose: 132 mg/dL — ABNORMAL HIGH
Potassium: 4.2 mmol/L
SGOT(AST): 82 U/L — ABNORMAL HIGH
SGPT (ALT): 33 U/L
Sodium: 139 mmol/L
Total Protein: 7.1 g/dL

## 2014-11-02 LAB — CBC
HCT: 36.7 % — AB (ref 40.0–52.0)
HGB: 12.1 g/dL — AB (ref 13.0–18.0)
MCH: 28.9 pg (ref 26.0–34.0)
MCHC: 32.9 g/dL (ref 32.0–36.0)
MCV: 88 fL (ref 80–100)
Platelet: 278 10*3/uL (ref 150–440)
RBC: 4.17 10*6/uL — AB (ref 4.40–5.90)
RDW: 13.7 % (ref 11.5–14.5)
WBC: 12.3 10*3/uL — ABNORMAL HIGH (ref 3.8–10.6)

## 2014-11-02 LAB — TROPONIN I: TROPONIN-I: 0.08 ng/mL — AB

## 2014-11-02 LAB — CK TOTAL AND CKMB (NOT AT ARMC)
CK, Total: 2002 U/L — ABNORMAL HIGH
CK-MB: 20.5 ng/mL — AB

## 2014-11-03 ENCOUNTER — Ambulatory Visit: Admit: 2014-11-03 | Disposition: A | Discharge status: Home / Self Care | Payer: Self-pay | Admitting: Neurology

## 2014-11-03 LAB — BASIC METABOLIC PANEL
Anion Gap: 10 (ref 7–16)
BUN: 41 mg/dL — ABNORMAL HIGH
Calcium, Total: 8.3 mg/dL — ABNORMAL LOW
Chloride: 109 mmol/L
Co2: 22 mmol/L
Creatinine: 2.31 mg/dL — ABNORMAL HIGH
EGFR (African American): 28 — ABNORMAL LOW
GFR CALC NON AF AMER: 24 — AB
GLUCOSE: 130 mg/dL — AB
Potassium: 4.1 mmol/L
Sodium: 141 mmol/L

## 2014-11-03 LAB — SEDIMENTATION RATE: Erythrocyte Sed Rate: 89 mm/hr — ABNORMAL HIGH (ref 0–20)

## 2014-11-03 LAB — TROPONIN I
TROPONIN-I: 0.07 ng/mL — AB
Troponin-I: 0.11 ng/mL — ABNORMAL HIGH

## 2014-11-03 LAB — CK-MB: CK-MB: 15.2 ng/mL — ABNORMAL HIGH

## 2014-11-03 LAB — CBC WITH DIFFERENTIAL/PLATELET
BASOS ABS: 0.1 10*3/uL (ref 0.0–0.1)
BASOS PCT: 0.7 %
EOS ABS: 0.3 10*3/uL (ref 0.0–0.7)
Eosinophil %: 2.8 %
HCT: 34.9 % — ABNORMAL LOW (ref 40.0–52.0)
HGB: 11.4 g/dL — AB (ref 13.0–18.0)
LYMPHS ABS: 0.8 10*3/uL — AB (ref 1.0–3.6)
Lymphocyte %: 7.5 %
MCH: 28.8 pg (ref 26.0–34.0)
MCHC: 32.8 g/dL (ref 32.0–36.0)
MCV: 88 fL (ref 80–100)
Monocyte #: 1.1 x10 3/mm — ABNORMAL HIGH (ref 0.2–1.0)
Monocyte %: 9.8 %
Neutrophil #: 8.8 10*3/uL — ABNORMAL HIGH (ref 1.4–6.5)
Neutrophil %: 79.2 %
Platelet: 277 10*3/uL (ref 150–440)
RBC: 3.97 10*6/uL — ABNORMAL LOW (ref 4.40–5.90)
RDW: 13.9 % (ref 11.5–14.5)
WBC: 11.1 10*3/uL — ABNORMAL HIGH (ref 3.8–10.6)

## 2014-11-03 LAB — CK: CK, TOTAL: 2043 U/L — AB

## 2014-11-04 LAB — BASIC METABOLIC PANEL
Anion Gap: 4 — ABNORMAL LOW (ref 7–16)
BUN: 34 mg/dL — ABNORMAL HIGH
CREATININE: 2.06 mg/dL — AB
Calcium, Total: 7.8 mg/dL — ABNORMAL LOW
Chloride: 111 mmol/L
Co2: 21 mmol/L — ABNORMAL LOW
EGFR (African American): 33 — ABNORMAL LOW
EGFR (Non-African Amer.): 28 — ABNORMAL LOW
GLUCOSE: 123 mg/dL — AB
Potassium: 4.7 mmol/L
SODIUM: 136 mmol/L

## 2014-11-04 LAB — CK: CK, Total: 810 U/L — ABNORMAL HIGH

## 2014-11-05 LAB — BASIC METABOLIC PANEL
Anion Gap: 4 — ABNORMAL LOW (ref 7–16)
BUN: 33 mg/dL — AB
Calcium, Total: 7.7 mg/dL — ABNORMAL LOW
Chloride: 110 mmol/L
Co2: 23 mmol/L
Creatinine: 2.05 mg/dL — ABNORMAL HIGH
GFR CALC AF AMER: 33 — AB
GFR CALC NON AF AMER: 28 — AB
GLUCOSE: 99 mg/dL
Potassium: 4.2 mmol/L
Sodium: 137 mmol/L

## 2014-11-05 LAB — CK: CK, Total: 506 U/L — ABNORMAL HIGH

## 2014-11-07 DIAGNOSIS — E1122 Type 2 diabetes mellitus with diabetic chronic kidney disease: Secondary | ICD-10-CM | POA: Diagnosis not present

## 2014-11-07 DIAGNOSIS — G20 Parkinson's disease: Secondary | ICD-10-CM | POA: Diagnosis not present

## 2014-11-07 DIAGNOSIS — I129 Hypertensive chronic kidney disease with stage 1 through stage 4 chronic kidney disease, or unspecified chronic kidney disease: Secondary | ICD-10-CM | POA: Diagnosis not present

## 2014-11-07 DIAGNOSIS — M4800 Spinal stenosis, site unspecified: Secondary | ICD-10-CM | POA: Diagnosis not present

## 2014-11-07 DIAGNOSIS — I25810 Atherosclerosis of coronary artery bypass graft(s) without angina pectoris: Secondary | ICD-10-CM | POA: Diagnosis not present

## 2014-11-07 DIAGNOSIS — N182 Chronic kidney disease, stage 2 (mild): Secondary | ICD-10-CM | POA: Diagnosis not present

## 2014-11-10 ENCOUNTER — Telehealth: Payer: Self-pay

## 2014-11-10 NOTE — Telephone Encounter (Signed)
Gary Ortega (physical therapist) at [facility] called requesting verbal orders for patient to continue physical therapy. Patient was in the hospital last week but they are continuing physical therapy. Per Dr.McKeown, ok for patient to continue physical therapy. [name] aware.

## 2014-11-11 DIAGNOSIS — M4800 Spinal stenosis, site unspecified: Secondary | ICD-10-CM | POA: Diagnosis not present

## 2014-11-11 DIAGNOSIS — I129 Hypertensive chronic kidney disease with stage 1 through stage 4 chronic kidney disease, or unspecified chronic kidney disease: Secondary | ICD-10-CM | POA: Diagnosis not present

## 2014-11-11 DIAGNOSIS — N182 Chronic kidney disease, stage 2 (mild): Secondary | ICD-10-CM | POA: Diagnosis not present

## 2014-11-11 DIAGNOSIS — E1122 Type 2 diabetes mellitus with diabetic chronic kidney disease: Secondary | ICD-10-CM | POA: Diagnosis not present

## 2014-11-11 DIAGNOSIS — G20 Parkinson's disease: Secondary | ICD-10-CM | POA: Diagnosis not present

## 2014-11-11 DIAGNOSIS — I25810 Atherosclerosis of coronary artery bypass graft(s) without angina pectoris: Secondary | ICD-10-CM | POA: Diagnosis not present

## 2014-11-12 DIAGNOSIS — I25810 Atherosclerosis of coronary artery bypass graft(s) without angina pectoris: Secondary | ICD-10-CM | POA: Diagnosis not present

## 2014-11-12 DIAGNOSIS — N182 Chronic kidney disease, stage 2 (mild): Secondary | ICD-10-CM | POA: Diagnosis not present

## 2014-11-12 DIAGNOSIS — E1122 Type 2 diabetes mellitus with diabetic chronic kidney disease: Secondary | ICD-10-CM | POA: Diagnosis not present

## 2014-11-12 DIAGNOSIS — M4800 Spinal stenosis, site unspecified: Secondary | ICD-10-CM | POA: Diagnosis not present

## 2014-11-12 DIAGNOSIS — I129 Hypertensive chronic kidney disease with stage 1 through stage 4 chronic kidney disease, or unspecified chronic kidney disease: Secondary | ICD-10-CM | POA: Diagnosis not present

## 2014-11-12 DIAGNOSIS — G20 Parkinson's disease: Secondary | ICD-10-CM | POA: Diagnosis not present

## 2014-11-14 DIAGNOSIS — I129 Hypertensive chronic kidney disease with stage 1 through stage 4 chronic kidney disease, or unspecified chronic kidney disease: Secondary | ICD-10-CM | POA: Diagnosis not present

## 2014-11-14 DIAGNOSIS — I25810 Atherosclerosis of coronary artery bypass graft(s) without angina pectoris: Secondary | ICD-10-CM | POA: Diagnosis not present

## 2014-11-14 DIAGNOSIS — E1122 Type 2 diabetes mellitus with diabetic chronic kidney disease: Secondary | ICD-10-CM | POA: Diagnosis not present

## 2014-11-14 DIAGNOSIS — M4800 Spinal stenosis, site unspecified: Secondary | ICD-10-CM | POA: Diagnosis not present

## 2014-11-14 DIAGNOSIS — N182 Chronic kidney disease, stage 2 (mild): Secondary | ICD-10-CM | POA: Diagnosis not present

## 2014-11-14 DIAGNOSIS — G20 Parkinson's disease: Secondary | ICD-10-CM | POA: Diagnosis not present

## 2014-11-17 DIAGNOSIS — N182 Chronic kidney disease, stage 2 (mild): Secondary | ICD-10-CM | POA: Diagnosis not present

## 2014-11-17 DIAGNOSIS — I25810 Atherosclerosis of coronary artery bypass graft(s) without angina pectoris: Secondary | ICD-10-CM | POA: Diagnosis not present

## 2014-11-17 DIAGNOSIS — E1122 Type 2 diabetes mellitus with diabetic chronic kidney disease: Secondary | ICD-10-CM | POA: Diagnosis not present

## 2014-11-17 DIAGNOSIS — M4800 Spinal stenosis, site unspecified: Secondary | ICD-10-CM | POA: Diagnosis not present

## 2014-11-17 DIAGNOSIS — I129 Hypertensive chronic kidney disease with stage 1 through stage 4 chronic kidney disease, or unspecified chronic kidney disease: Secondary | ICD-10-CM | POA: Diagnosis not present

## 2014-11-17 DIAGNOSIS — G20 Parkinson's disease: Secondary | ICD-10-CM | POA: Diagnosis not present

## 2014-11-18 ENCOUNTER — Telehealth: Payer: Self-pay | Admitting: *Deleted

## 2014-11-18 ENCOUNTER — Other Ambulatory Visit: Payer: Self-pay | Admitting: Internal Medicine

## 2014-11-18 ENCOUNTER — Ambulatory Visit (INDEPENDENT_AMBULATORY_CARE_PROVIDER_SITE_OTHER): Payer: Medicare Other | Admitting: Internal Medicine

## 2014-11-18 ENCOUNTER — Encounter: Payer: Self-pay | Admitting: Internal Medicine

## 2014-11-18 VITALS — BP 138/74 | HR 72 | Temp 97.0°F | Resp 16 | Ht 67.0 in | Wt 159.0 lb

## 2014-11-18 DIAGNOSIS — Z8739 Personal history of other diseases of the musculoskeletal system and connective tissue: Secondary | ICD-10-CM

## 2014-11-18 DIAGNOSIS — I25810 Atherosclerosis of coronary artery bypass graft(s) without angina pectoris: Secondary | ICD-10-CM

## 2014-11-18 DIAGNOSIS — G20 Parkinson's disease: Secondary | ICD-10-CM

## 2014-11-18 DIAGNOSIS — W19XXXD Unspecified fall, subsequent encounter: Secondary | ICD-10-CM

## 2014-11-18 LAB — BASIC METABOLIC PANEL
BUN: 41 mg/dL — ABNORMAL HIGH (ref 6–23)
CO2: 25 mEq/L (ref 19–32)
CREATININE: 1.98 mg/dL — AB (ref 0.50–1.35)
Calcium: 9.8 mg/dL (ref 8.4–10.5)
Chloride: 100 mEq/L (ref 96–112)
Glucose, Bld: 92 mg/dL (ref 70–99)
POTASSIUM: 4.6 meq/L (ref 3.5–5.3)
Sodium: 138 mEq/L (ref 135–145)

## 2014-11-18 LAB — CBC WITH DIFFERENTIAL/PLATELET
BASOS ABS: 0.1 10*3/uL (ref 0.0–0.1)
BASOS PCT: 1 % (ref 0–1)
Eosinophils Absolute: 0.2 10*3/uL (ref 0.0–0.7)
Eosinophils Relative: 2 % (ref 0–5)
HCT: 42.2 % (ref 39.0–52.0)
Hemoglobin: 14 g/dL (ref 13.0–17.0)
LYMPHS ABS: 1.4 10*3/uL (ref 0.7–4.0)
LYMPHS PCT: 18 % (ref 12–46)
MCH: 28.9 pg (ref 26.0–34.0)
MCHC: 33.2 g/dL (ref 30.0–36.0)
MCV: 87.2 fL (ref 78.0–100.0)
MPV: 9.1 fL (ref 8.6–12.4)
Monocytes Absolute: 0.6 10*3/uL (ref 0.1–1.0)
Monocytes Relative: 8 % (ref 3–12)
NEUTROS PCT: 71 % (ref 43–77)
Neutro Abs: 5.7 10*3/uL (ref 1.7–7.7)
Platelets: 413 10*3/uL — ABNORMAL HIGH (ref 150–400)
RBC: 4.84 MIL/uL (ref 4.22–5.81)
RDW: 14.2 % (ref 11.5–15.5)
WBC: 8 10*3/uL (ref 4.0–10.5)

## 2014-11-18 NOTE — Telephone Encounter (Signed)
OK to discontinue OT per Dr Melford Aase.

## 2014-11-18 NOTE — Telephone Encounter (Signed)
Estill Bamberg from [facility] called and reported OT for the patient was no longer needed.  OK to discontinue per Dr Melford Aase.  [facility] aware.

## 2014-11-18 NOTE — Progress Notes (Signed)
Patient ID: Gary Ortega, male   DOB: 1925/01/17, 81 y.o.   MRN: [id number]  HPI  Patient is an 81 y.o. with hypertension, parkinsons, anxiety, depression and bilateral leg weakness who presents to the office with his daughter for after hospital evaluation.  Patient lives independently at home and his daughters look in on him from time to time.  He was at home by himself and was on his knees trying to grab the pool skimmer and fell face down and was unable to get himself up.  He was there for 2 hours before his daughters found him.  He does have a life alert but the cord on it broke.  24 hours after he developed severe shaking and was taken via EMS to the [facility] hospital.  He was evaluated there and had elevated CKMB and Sed rate and was diagnosed with rhabdomyolysis.  Patient had normal troponins, normal head CT and MRI.  Non-specific autoimmune labs were normal.  Patient has since gone home from the hospital after aggressive hydration and has been doing well per his daughter.  He has been doing PT but has been non-compliant with his home exercises.  He is also shuffling his gait a little bit more.    Past Medical History  Diagnosis Date  . Hypertension   . Fatigue   . Shortness of breath   . Chest pain   . Vitamin D deficiency     unspec.  Marland Kitchen Anxiety   . Depressed   . Hyperlipidemia   . BPH (benign prostatic hypertrophy) with urinary obstruction   . History of kidney stones   . H/O hemorrhoids      Medication List       This list is accurate as of: 11/18/14  1:17 PM.  Always use your most recent med list.               amLODipine 5 MG tablet  Commonly known as:  NORVASC  TAKE ONE TABLET BY MOUTH AT BEDTIME FOR BLOOD PRESSURE     carbidopa-levodopa 25-250 MG per tablet  Commonly known as:  SINEMET IR  TAKE 1 TABLET TWICE A DAY     clopidogrel 75 MG tablet  Commonly known as:  PLAVIX  TAKE 1 TABLET DAILY TO     PREVENT STROKE     Fish Oil Oil  Take by mouth daily. 1  tablespoon     metoprolol succinate 25 MG 24 hr tablet  Commonly known as:  TOPROL-XL  1/2-1 pill at night for blood pressure     multivitamin per tablet  Take 1 tablet by mouth daily.     nitroGLYCERIN 0.4 MG SL tablet  Commonly known as:  NITROSTAT  Place 1 tablet (0.4 mg total) under the tongue every 5 (five) minutes as needed for chest pain. One tablet under tongue at onset of chest pain; may repeat every 5 minutes for up to 3 doses     vitamin C 500 MG tablet  Commonly known as:  ASCORBIC ACID  Take 500 mg by mouth daily.     Vitamin D3 5000 UNITS Caps  Take by mouth daily.       Review of Systems  Constitutional: Positive for malaise/fatigue. Negative for fever, chills and weight loss.  Cardiovascular: Negative for chest pain, palpitations and leg swelling.  Musculoskeletal: Positive for falls. Negative for myalgias and joint pain.  Neurological: Positive for focal weakness (bilateral leg weakness). Negative for sensory change.  Psychiatric/Behavioral: Positive for  depression. Negative for suicidal ideas and memory loss.  All other systems reviewed and are negative.  Physical Exam  Constitutional: He is oriented to person, place, and time and well-developed, well-nourished, and in no distress. No distress.  HENT:  Head: Normocephalic and atraumatic.  Mouth/Throat: Oropharynx is clear and moist. No oropharyngeal exudate.  Mask face, poor blinking  Eyes: Conjunctivae are normal. No scleral icterus.  Neck: Normal range of motion. Neck supple.  Cardiovascular: Normal rate, regular rhythm and normal heart sounds.  Exam reveals no gallop and no friction rub.   No murmur heard. Pulmonary/Chest: Effort normal and breath sounds normal. No respiratory distress. He has no wheezes. He has no rales. He exhibits no tenderness.  Musculoskeletal:  Cogwheel rigidity  Neurological: He is alert and oriented to person, place, and time.  Shuffling gait with cane in right hand   Skin:  Skin is warm and dry. He is not diaphoretic.  Psychiatric: Judgment normal. He exhibits a depressed mood. He has a flat affect.  Nursing note and vitals reviewed.   Assessment and Plan:  Falls -cont home PT -gradually increase home exercise -may be due to stiffness from parkinsons will increase sinemet to 3 times daily with meals -recheck in 2 weeks  History of recent Rhabdomyolysis -recheck sed rate -recheck CPK and CKMB -CBC -Bmet

## 2014-11-18 NOTE — Patient Instructions (Signed)
Rhabdomyolysis Rhabdomyolysis is the breakdown of muscle fibers due to injury. The injury may come from physical damage to the muscle like an injury but other causes are:  High fever (hyperthermia).  Seizures (convulsions).  Low phosphate levels.  Diseases of metabolism.  Heatstroke.  Drug toxicity.  Over exertion.  Alcoholism.  Muscle is cut off from oxygen (anoxia).  The squeezing of nerves and blood vessels (compartment syndrome). Some drugs which may cause the breakdown of muscle are:  Antibiotics.  Statins.  Alcohol.  Animal toxins. Myoglobin is a substance which helps muscle use oxygen. When the muscle is damaged, the myoglobin is released into the bloodstream. It is filtered out of the bloodstream by the kidneys. Myoglobin may block up the kidneys. This may cause damage, such as kidney failure. It also breaks down into other damaging toxic parts, which also cause kidney failure.  SYMPTOMS   Dark, red, or tea colored urine.  Weakness of affected muscles.  Weight gain from water retention.  Joint aches and pains.  Irregular heart from high potassium in the blood.  Muscle tenderness or aching.  Generalized weakness.  Seizures.  Feeling tired (fatigue). DIAGNOSIS  Your caregiver may find muscle tenderness on exam and suspect the problem. Urine tests and blood work can confirm the problem. TREATMENT   Early and aggressive treatment with large amounts of fluids may help prevent kidney failure.  Water producing medicine (diuretic) may be used to help flush the kidneys.  High potassium and calcium problems (electrolyte) in your blood may need treatment. HOME CARE INSTRUCTIONS  This problem is usually cared for in a hospital. If you are allowed to go home and require dialysis, make sure you keep all appointments for lab work and dialysis. Not doing so could result in death. Document Released: 07-04-2004 Document Revised: 10/03/2011 Document Reviewed:  01/05/2009 [facility] Patient Information 2015 [place], [place]. This information is not intended to replace advice given to you by your health care provider. Make sure you discuss any questions you have with your health care provider.  Erythrocyte Sedimentation Rate This is a blood test in which the red blood cells are measured on how long it takes for the cells to settle in a saline or plasma solution over a specified amount of time. In the case of an inflammation or infection in the body, the red blood cells become heavier and will settle more rapidly. This test is used to evaluate and manage rheumatoid arthritis, as well as other inflammatory conditions. PREPARATION FOR TEST Some medications may need to be held in the case that they may affect test results. Your caregiver will let you know if this is necessary.  NORMAL FINDINGS Westergren method Male: up to 15 mm/hr Male:  up to 20 mm/hr Child: up to 10 mm/hr Newborn: 0-2 mm/hr The ESR results depend on which test the lab uses. Ranges for normal findings may vary among different laboratories and hospitals. You should always check with your doctor after having lab work or other tests done to discuss the meaning of your test results and whether your values are considered within normal limits. MEANING OF TEST  Your caregiver will go over the test results with you and discuss the importance and meaning of your results, as well as treatment options and the need for additional tests if necessary. OBTAINING THE TEST RESULTS  It is your responsibility to obtain your test results. Ask the lab or department performing the test when and how you will get your results. Document  Released: 08/03/2004 Document Revised: 10/03/2011 Document Reviewed: 06/20/2008 [facility] Patient Information 2015 [place], [place]. This information is not intended to replace advice given to you by your health care provider. Make sure you discuss any questions you have with your  health care provider.

## 2014-11-19 DIAGNOSIS — E1122 Type 2 diabetes mellitus with diabetic chronic kidney disease: Secondary | ICD-10-CM | POA: Diagnosis not present

## 2014-11-19 DIAGNOSIS — I129 Hypertensive chronic kidney disease with stage 1 through stage 4 chronic kidney disease, or unspecified chronic kidney disease: Secondary | ICD-10-CM | POA: Diagnosis not present

## 2014-11-19 DIAGNOSIS — M4800 Spinal stenosis, site unspecified: Secondary | ICD-10-CM | POA: Diagnosis not present

## 2014-11-19 DIAGNOSIS — G20 Parkinson's disease: Secondary | ICD-10-CM | POA: Diagnosis not present

## 2014-11-19 DIAGNOSIS — I25810 Atherosclerosis of coronary artery bypass graft(s) without angina pectoris: Secondary | ICD-10-CM | POA: Diagnosis not present

## 2014-11-19 DIAGNOSIS — N182 Chronic kidney disease, stage 2 (mild): Secondary | ICD-10-CM | POA: Diagnosis not present

## 2014-11-19 LAB — SEDIMENTATION RATE: Sed Rate: 21 mm/hr — ABNORMAL HIGH (ref 0–20)

## 2014-11-23 NOTE — H&P (Signed)
PATIENT NAME:  Gary Ortega, Gary Ortega MR#:  [id number] DATE OF BIRTH:  April 20, 1925  DATE OF ADMISSION:  11/02/2014  REFERRING PHYSICIAN:  Sheryl L. Benjaman Lobe, MD   PRIMARY CARE PRACTITIONER:  Kirtland Bouchard, MD   ADMITTING PHYSICIAN:  Juluis Mire, MD  CHIEF COMPLAINT: 1.  Generalized weakness.  2.  Falling by the side of the pool yesterday for over 2 hours.   HISTORY OF PRESENT ILLNESS:  This is an 81 year old Caucasian male with a history of hypertension, Parkinson disease, prostate cancers, history of cerebellar stroke in the past, and CKD stage III, who was brought with the complaints of generalized weakness. According to the patient, the patient was cleaning his pool yesterday, and during the process, he felt weakness and could not get up from the floor and laid on the floor by the pool for about 2 hours since there was no help. Subsequently, when his daughters came home, they found him outside by the side of the pool. He was brought inside and later on noted to have generalized weakness and having difficulty in walking. Hence, the patient was brought to the Emergency Room for further evaluation.   In the Emergency Room, the patient was evaluated by the ED physician and was found to have stable vital signs and his creatinine was stable at baseline of 2.49 and was noted to have elevated CK, indicative of rhabdomyolysis. The patient was started on IV fluids and hospitalist service was consulted for further management. No history of any chest pain or shortness of breath prior to episode. No focal weakness or numbness. No fever. He did have some cough for the past one week for which he is using Z-Pak for the past 3 days as per his primary care practitioner. No nausea, vomiting, or diarrhea. No dysuria, frequency, or urgency. The patient is alert, awake, and oriented x 3 and denies any pain anywhere. No history of any falls. Denies passing dark-colored urine.   PAST MEDICAL HISTORY: 1.   Hypertension.  2.  Parkinson disease.  3.  Cerebellar stroke.  4.  Prostate cancer status post seed implantation.  5.  CKD stage III.   PAST SURGICAL HISTORY:  1.  Nephrectomy for a benign kidney mass.  2.  Appendectomy.   ALLERGIES:  No known drug allergies.   FAMILY HISTORY:  Negative for history of hypertension, diabetes, stroke, or cardiac disease.   SOCIAL HISTORY:  He is widowed and lives alone at home. He is ambulatory with the help of a cane and denies any history of smoking, alcohol or substance abuse.   HOME MEDICATIONS:   1.  Amlodipine 5 mg 1 tablet orally once a day.  2.  Carbidopa/levodopa 25 mg/250 mg tablet 1 tablet 2 times a day.  3.  Metoprolol succinate 12.5 mg orally 1 tablet once a day.  4.  Plavix 75 mg 1 tablet orally once a day.   REVIEW OF SYSTEMS:  CONSTITUTIONAL:  Negative for fever or chills. Positive for fatigue with generalized weakness, as noted in the history of present illness.  EYES:  Negative for blurred vision or double vision. No pain. No redness. No discharge.  EARS, NOSE, AND THROAT:  Negative for tinnitus, ear pain, hearing loss, epistaxis, or nasal discharge.  RESPIRATORY:  Positive for some cough ongoing for the past one week for which he has been using Z-Pak. He completed a 3-day course. Denies any wheezing, dyspnea, hemoptysis, or painful respiration.  CARDIOVASCULAR:  Negative for chest pain, palpitations, dizziness,  syncopal episodes, orthopnea, dyspnea on exertion, or pedal edema.  GASTROINTESTINAL:  Negative for nausea, vomiting, diarrhea, constipation, abdominal pain, hematemesis, melena, or rectal bleeding.  GENITOURINARY:  Negative for dysuria, frequency, urgency, or hematuria.  ENDOCRINE:  Negative for polyuria, nocturia, or heat or cold intolerance.  HEMATOLOGIC AND LYMPHATIC:  Negative for anemia, easy bruising or bleeding, or swollen glands.  INTEGUMENTARY:  Negative for acne, skin rash, or lesions.  MUSCULOSKELETAL:  Negative  for neck or back pain. No history of arthritis or gout.  NEUROLOGICAL:  Negative for focal weakness or numbness. History of cerebellar stroke in the past and history of Parkinson disease present.  PSYCHIATRIC:  Negative for anxiety, insomnia, or depression.   PHYSICAL EXAMINATION: VITAL SIGNS:  Temperature 99.1 degrees Fahrenheit, pulse rate 97 per minute, respirations 24 per minute, blood pressure 182/75, oxygen saturation 96% on room air.  GENERAL:  Well nourished, alert, in no acute distress, comfortably resting in the bed.  HEAD:  Atraumatic, normocephalic.  EYES:  Pupils are equal and reactive to light and accommodation. No conjunctival pallor. No icterus. Extraocular movements are intact.  NOSE:  No drainage. No lesions.  EARS:  No drainage.  ORAL CAVITY:  No mucosal lesions. No icterus.  NECK:  Supple. No JVD. No thyromegaly. No carotid bruit. Range of motion of the neck is within normal limits.  RESPIRATORY:  Good respiratory effort. Not using accessory muscles of respiration. Bilateral vesicular sounds present. No rales or rhonchi.  CARDIOVASCULAR:  S1, S2 regular. No murmurs, gallops, or clicks. Pulses equal at carotid, femoral, and pedal pulses. No peripheral edema.  GASTROINTESTINAL:  Abdomen is soft and nontender. No hepatosplenomegaly. No masses. No rigidity. No guarding. Bowel sounds present and equal in all 4 quadrants.  GENITOURINARY:  Deferred.  MUSCULOSKELETAL:  No joint tenderness or effusion. Range of motion is adequate. Strength and tone are equal bilaterally.  SKIN:  Inspection within normal limits.  LYMPHATIC:  No cervical lymphadenopathy.  VASCULAR:  Good dorsalis pedis and posterior tibial pulses.  NEUROLOGICAL:  Alert, awake, and oriented x 3. Cranial nerves II through XII are grossly intact. No sensory deficit. Motor strength is 5/5 in both upper and lower extremities. DTRs are 2+ bilaterally and symmetrical. Plantars are downgoing.  PSYCHIATRIC:  Alert, awake, and  oriented x 3. Judgment and insight are adequate. Memory and mood are within normal limits.   ANCILLARY DATA:   LABORATORY DATA:  Serum glucose is 152, BUN 51, creatinine 2.49, sodium 139, potassium 4.2, chloride 106, bicarbonate 24, total calcium 8.8, total protein 7.1, albumin 2.9, total bilirubin 0.1, alkaline phosphatase 65, AST 82, and ALT 33. Total CK is 2002, CK-MB 20.5. Troponin is 0.08. WBC is 12.3, hemoglobin 12.1, hematocrit 36.7, platelet count 278,000. Urinalysis:  Clear, bacteria none, WBC 0 to 5.   IMAGING STUDIES:  CT of the head, noncontrast study:  Old right cerebellar infarction, no acute abnormality seen.   EKG:  Normal sinus rhythm with ventricular rate of 91 beats per minute, left axis deviation, no acute ST-T changes.   Chest x-ray is pending at this time.   ASSESSMENT AND PLAN:  This is an 81 year old Caucasian male with history of hypertension, chronic kidney disease stage III, Parkinson disease, and prostatic cancer, who presents with the complaints of generalized weakness following found lying on the floor outside near the pool for 2 hours. Evaluation revealed elevated CK consistent with rhabdomyolysis. CT of the head was negative for acute findings.   1.  Rhabdomyolysis. The patient was found  outside near pool for about 2 hours because of generalized weakness. Plan:  Admit to medical floor. IV hydration. Follow up labs.  2.  Chronic kidney disease stage III, stable. Prior creatinine per daughter was 2.6, current creatinine 2.49. Plan:  Monitor. Follow BMP closely.  3.  Bronchitis, using Z-Pak for the past 3 days. Two more days dose left over. He does have a mild cough and a mildly elevated white blood cell count. Chest x-ray is pending at this time. We will continue Z-Pak for now and follow up chest x-ray and CBC.  4.  Mildly elevated troponin. No chest pain. EKG:  No acute changes. No history of coronary artery disease, likely related to rhabdomyolysis. Plan:  We will  monitor under telemetry. Continue beta blocker. Cycle cardiac enzymes.  5.  Parkinson disease, stable on home medications. Continue same. Fall precautions. OT and PT consultation to help in assistance with gait.  6.  History of prior cerebrovascular accident. No acute symptoms. CT of the head is negative for acute findings.  7.  Hypertension, stable on home medications. Continue same.  8.  History of prostate cancer status post seed implant, stable. No acute problems.  9.  Deep vein thrombosis prophylaxis with subcutaneous heparin.  10.  Gastrointestinal prophylaxis with proton pump inhibitor.   CODE STATUS:  Full code.   TIME SPENT:  50 minutes.    ____________________________ Juluis Mire, MD enr:nb D: 11/03/2014 00:04:27 ET T: 11/03/2014 00:38:35 ET JOB#: [id number]  cc: Juluis Mire, MD, <Dictator> Kirtland Bouchard, MD Juluis Mire MD ELECTRONICALLY SIGNED 11/05/2014 15:53

## 2014-11-23 NOTE — Discharge Summary (Signed)
PATIENT NAME:  Gary Ortega, Gary Ortega MR#:  [id number] DATE OF BIRTH:  1924-07-30  DATE OF ADMISSION:  11/02/2014 DATE OF DISCHARGE:  11/05/2014  ADMITTING DIAGNOSES:  Generalized weakness and fall.   DISCHARGE DIAGNOSES:  1. Rhabdomyolysis due to fall.  2. Elevated sedimentation rate of unclear etiology, needs outpatient rheumatological evaluation and a repeat sedimentation rate.  3. Chronic kidney disease, stage III. 4. Bronchitis, status post treatment.  5. Parkinson-like disorder, needs outpatient neurology followup.  6. History of previous cerebrovascular accident with significant small vessel disease noted on MRI of the brain.  7. Hypertension, essential.  8. History of prostate cancer, status post seed implant.  9. Chronic renal failure.  CONSULTANTS:  Dr. Irish Elders.    PERTINENT LABORATORIES AND EVALUATIONS:  Admitting glucose 132, BUN 51,  creatinine 2.49, sodium 139, potassium 4.2, chloride 106.  CO2 was 24.  LFTs showed a total protein of 7.1, albumin 2.9, bilirubin total 0.1.  Alkaline phosphatase was 65, AST 82, ALT 33. CPK on admission was 2002; on discharge was 506.  Troponin was 0.08, 0.11, and 0.07.  WBC 12.3, hemoglobin 12.1.  Platelet count was 278,000.  Urinalysis was negative.  Acetylcysteine receptor less than 3.  Anti-Jo antibodies are pending.  ANA test was negative.  Sedimentation rate was elevated at 89.  MRI of the brain showed chronic changes with severe small vessel disease, with chronic ischemia.   HOSPITAL COURSE:  Please refer to H and P done by the admitting physician.  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  The patient was brought to the ED and was noted to have rhabdomyolysis.  He was admitted, given IV fluids.  His CPK started trending downwards.  Daughter was concerned about his movement and there was also some concern about  his ability to ambulate.  He underwent an MRI, EEG  which all were negative.  He was seen by physical therapy and they recommended home health.  The patient had an abnormal sedimentation rate which needs to be followed up as an outpatient as well as a CPK repeated as outpatient.  I have referred him to neurology and rheumatology as outpatient.  Currently he is stable for discharge.   DISCHARGE MEDICATIONS:  Amlodipine 5 p.o. q. daily, Plavix 75 p.o. q. daily, carbidopa/levodopa 25/250 one tablet p.o. b.i.d., metoprolol succinate extended release 12.5 p.o. q. daily, Tylenol 650 q. 4 p.r.n.   SERVICES:  Home health services with physical therapy, nurse, nurse aide, and social worker.   DIET:  Low sodium.   ACTIVITY:  As tolerated with walker.   FOLLOWUP:   1. Follow up with primary MD in 1-2 weeks.   2. Follow up with Dr. Jefm Bryant in 2-4 weeks for elevated CPKs and ESR.  3. Two to 4 weeks with [facility] neurology for Parkinson-like syndrome.   4. The patient to have sedimentation rate and CPK at the time of visit to his primary care provider.    TIME SPENT:  Thirty-five minutes on the discharge    ____________________________ Lafonda Mosses. Posey Pronto, MD shp:kc D: 11/05/2014 14:42:51 ET T: 11/05/2014 14:52:41 ET JOB#: [id number]  cc: Addaline Peplinski H. Posey Pronto, MD, <Dictator> [place] Alric Seton MD ELECTRONICALLY SIGNED 11/16/2014 8:28

## 2014-11-23 NOTE — Consult Note (Signed)
PATIENT NAME:  Gary Ortega, Gary Ortega MR#:  [id number] DATE OF BIRTH:  05-Sep-1924  DATE OF CONSULTATION:  11/03/2014  REFERRING PHYSICIAN:   CONSULTING PHYSICIAN:  Leotis Pain, MD  REASON FOR CONSULTATION:  Confusion.  HISTORY OF PRESENT ILLNESS: This 81 year old gentleman with past medical history of hypertension, history of Parkinson disease, prostate cancer, history of cerebellar strokes in the past, chronic kidney disease brought in to the Emergency Department due to generalized weakness. The patient states he was cleaning his pool and was found next to his pool unable to stand up and needed assistance from his children.  Admitted with elevated CK level, suspected rhabdomyolysis. The patient was recently on Z-Pak for 3 days for cough. Currently no nausea, no vomiting.  No dysuria, no frequency.   PAST MEDICAL HISTORY: Hypertension, Parkinson disease, cerebellar stroke, prostate cancer, chronic kidney disease.   PAST SURGICAL HISTORY: Nephrectomy, appendectomy.   ALLERGIES: No known drug allergies.   FAMILY HISTORY: Negative for a history of hypertension, diabetes, stroke, or coronary artery disease.   SOCIAL HISTORY: He is widowed and lives alone at home.   HOME MEDICATIONS: Have been reviewed. The patient is on Sinemet as well as Plavix.   REVIEW OF SYSTEMS: No chest pain, no shortness of breath.  Denies any weakness on one side of the body compared to the other. Denies any diarrhea. Currently denies any constipation. No history of psychiatric comorbidities.    NEUROLOGICAL EVALUATION: The patient is alert, awake to place, and the month.  Extraocular movements are intact. Facial sensation intact. The patient has a masklike face likely in the setting of chronic Parkinson disease.  On motor strength examination, the patient has a drift of the right upper extremity.  The rest of the extremities 4+/5. Sensation appears to be intact. Coordination intact. Reflexes 1+  throughout.  IMPRESSION: An 81 year old male with history of hypertension, Parkinson disease, prostate cancer, cerebellar strokes, generalized weakness admitted status post found down next to his pool with elevated CK, would suspect a rhabdomyolysis. The patient is status post MRI of the brain.  As I looked at it, the preliminary findings the patient has generalized atrophy throughout.  Has significant microvascular changes of chronic strokes. No acute intracranial abnormality was found.   PLAN: Continue the patient's daily Plavix, physical therapy, occupational therapy, case management, social work.  I do not think the patient can be discharged back to his house in which he lives alone with limited assistance. Will not change his Sinemet dose at this point. Hydration as per primary team.   Thank you. It was a pleasure seeing this patient. Please call with any questions.   ____________________________ Leotis Pain, MD yz:sp D: 11/03/2014 15:22:26 ET T: 11/03/2014 15:53:05 ET JOB#: [id number]  cc: Leotis Pain, MD, <Dictator> Leotis Pain MD ELECTRONICALLY SIGNED 11/18/2014 21:28

## 2014-11-24 DIAGNOSIS — G20 Parkinson's disease: Secondary | ICD-10-CM | POA: Diagnosis not present

## 2014-11-24 DIAGNOSIS — M4800 Spinal stenosis, site unspecified: Secondary | ICD-10-CM | POA: Diagnosis not present

## 2014-11-24 DIAGNOSIS — I129 Hypertensive chronic kidney disease with stage 1 through stage 4 chronic kidney disease, or unspecified chronic kidney disease: Secondary | ICD-10-CM | POA: Diagnosis not present

## 2014-11-24 DIAGNOSIS — N182 Chronic kidney disease, stage 2 (mild): Secondary | ICD-10-CM | POA: Diagnosis not present

## 2014-11-24 DIAGNOSIS — I25810 Atherosclerosis of coronary artery bypass graft(s) without angina pectoris: Secondary | ICD-10-CM | POA: Diagnosis not present

## 2014-11-24 DIAGNOSIS — E1122 Type 2 diabetes mellitus with diabetic chronic kidney disease: Secondary | ICD-10-CM | POA: Diagnosis not present

## 2014-11-24 LAB — CK TOTAL AND CKMB (NOT AT ARMC): CK TOTAL: 85 U/L (ref 7–232)

## 2014-11-26 DIAGNOSIS — G20 Parkinson's disease: Secondary | ICD-10-CM | POA: Diagnosis not present

## 2014-11-26 DIAGNOSIS — E1122 Type 2 diabetes mellitus with diabetic chronic kidney disease: Secondary | ICD-10-CM | POA: Diagnosis not present

## 2014-11-26 DIAGNOSIS — I25810 Atherosclerosis of coronary artery bypass graft(s) without angina pectoris: Secondary | ICD-10-CM | POA: Diagnosis not present

## 2014-11-26 DIAGNOSIS — N182 Chronic kidney disease, stage 2 (mild): Secondary | ICD-10-CM | POA: Diagnosis not present

## 2014-11-26 DIAGNOSIS — M4800 Spinal stenosis, site unspecified: Secondary | ICD-10-CM | POA: Diagnosis not present

## 2014-11-26 DIAGNOSIS — I129 Hypertensive chronic kidney disease with stage 1 through stage 4 chronic kidney disease, or unspecified chronic kidney disease: Secondary | ICD-10-CM | POA: Diagnosis not present

## 2014-12-02 ENCOUNTER — Ambulatory Visit (INDEPENDENT_AMBULATORY_CARE_PROVIDER_SITE_OTHER): Payer: Medicare Other | Admitting: Internal Medicine

## 2014-12-02 ENCOUNTER — Encounter: Payer: Self-pay | Admitting: Internal Medicine

## 2014-12-02 VITALS — BP 140/72 | HR 62 | Temp 97.8°F | Resp 16 | Ht 67.0 in | Wt 157.0 lb

## 2014-12-02 DIAGNOSIS — F4329 Adjustment disorder with other symptoms: Secondary | ICD-10-CM

## 2014-12-02 DIAGNOSIS — F4321 Adjustment disorder with depressed mood: Secondary | ICD-10-CM

## 2014-12-02 DIAGNOSIS — G20 Parkinson's disease: Secondary | ICD-10-CM | POA: Diagnosis not present

## 2014-12-02 DIAGNOSIS — Z634 Disappearance and death of family member: Secondary | ICD-10-CM

## 2014-12-02 DIAGNOSIS — I10 Essential (primary) hypertension: Secondary | ICD-10-CM

## 2014-12-02 NOTE — Patient Instructions (Signed)
Grief Reaction Grief is a normal response to the death of someone close to you. Feelings of fear, anger, and guilt can affect almost everyone who loses someone they love. Symptoms of depression are also common. These include problems with sleep, loss of appetite, and lack of energy. These grief reaction symptoms often last for weeks to months after a loss. They may also return during special times that remind you of the person you lost, such as an anniversary or birthday. Anxiety, insomnia, irritability, and deep depression may last beyond the period of normal grief. If you experience these feelings for 6 months or longer, you may have clinical depression. Clinical depression requires further medical attention. If you think that you have clinical depression, you should contact your caregiver. If you have a history of depression or a family history of depression, you are at greater risk of clinical depression. You are also at greater risk of developing clinical depression if the loss was traumatic or the loss was of someone with whom you had unresolved issues.  A grief reaction can become complicated by being blocked. This means being unable to cry or express extreme emotions. This may prolong the grieving period and worsen the emotional effects of the loss. Mourning is a natural event in human life. A healthy grief reaction is one that is not blocked. It requires a time of sadness and readjustment. It is very important to share your sorrow and fear with others, especially close friends and family. Professional counselors and clergy can also help you process your grief.   Recommend that you see [facility].  Address: [address] [place], [place] [address] Phone-[phone]  [facility] Address: [address], [place], [place] [address]  Phone: [phone]  Document Released: 07/11/2005 Document Revised: 11/25/2013 Document Reviewed: 03/21/2006 [facility] Patient  Information 2015 [place], [place]. This information is not intended to replace advice given to you by your health care provider. Make sure you discuss any questions you have with your health care provider.

## 2014-12-02 NOTE — Progress Notes (Signed)
   Subjective:    Patient ID: Gary Ortega, male    DOB: 09/03/1924, 81 y.o.   MRN: [id number]  HPI  Patient presents to the office with his daughter for recheck of parkinsons and falling.  We increased the dose of the sinemet.  He reports that he has had some increased dizziness from the increased dose that seems to come and go.  He reports that he is not doing any better or any worse.  He reports that he is not doing any exercises when he is at home by himself.  His daughter reports that he is not doing anything when they are not there.  His daughter thinks that he is afraid that if he goes outside he might fall.  He feels a lot safer when he is in the house.    Review of Systems  Constitutional: Negative for fever, chills and fatigue.  Respiratory: Negative for chest tightness and shortness of breath.   Cardiovascular: Negative for chest pain and palpitations.  Genitourinary: Negative for dysuria, urgency, frequency, hematuria and difficulty urinating.  Musculoskeletal: Positive for gait problem.  Psychiatric/Behavioral: Positive for dysphoric mood and decreased concentration. Negative for confusion, sleep disturbance and agitation. The patient is not nervous/anxious.        Objective:   Physical Exam  Constitutional: He is oriented to person, place, and time. He appears well-developed and well-nourished. No distress.  HENT:  Head: Normocephalic and atraumatic.  Mouth/Throat: Oropharynx is clear and moist. No oropharyngeal exudate.  Eyes: Conjunctivae and EOM are normal. Pupils are equal, round, and reactive to light. No scleral icterus.  Neck: Normal range of motion. Neck supple. No JVD present. No thyromegaly present.  Cardiovascular: Normal rate, regular rhythm, normal heart sounds and intact distal pulses.  Exam reveals no gallop and no friction rub.   No murmur heard. Pulmonary/Chest: Effort normal and breath sounds normal. No respiratory distress. He has no wheezes. He has  no rales. He exhibits no tenderness.  Musculoskeletal: Normal range of motion.  Cogwheel rigidity with shuffling gait.  Lymphadenopathy:    He has no cervical adenopathy.  Neurological: He is alert and oriented to person, place, and time.  Skin: Skin is warm and dry. He is not diaphoretic.  Psychiatric: His speech is normal. Judgment and thought content normal. He is slowed. Cognition and memory are normal. He exhibits a depressed mood.  Flat affect, no facial expression  Nursing note and vitals reviewed.         Assessment & Plan:   1. Parkinson's disease -cont sinemet dosed 3x daily  2. Essential hypertension -cut amlodipine in 1/2 and monitor BP -if BP goes over 150/90 consistently give other half  3. Complicated grief -therapy recommended -increase activities out of the house -Try to add in some more exercise

## 2014-12-03 DIAGNOSIS — G20 Parkinson's disease: Secondary | ICD-10-CM | POA: Diagnosis not present

## 2014-12-03 DIAGNOSIS — I129 Hypertensive chronic kidney disease with stage 1 through stage 4 chronic kidney disease, or unspecified chronic kidney disease: Secondary | ICD-10-CM | POA: Diagnosis not present

## 2014-12-03 DIAGNOSIS — M4800 Spinal stenosis, site unspecified: Secondary | ICD-10-CM | POA: Diagnosis not present

## 2014-12-03 DIAGNOSIS — I25810 Atherosclerosis of coronary artery bypass graft(s) without angina pectoris: Secondary | ICD-10-CM | POA: Diagnosis not present

## 2014-12-03 DIAGNOSIS — N182 Chronic kidney disease, stage 2 (mild): Secondary | ICD-10-CM | POA: Diagnosis not present

## 2014-12-03 DIAGNOSIS — E1122 Type 2 diabetes mellitus with diabetic chronic kidney disease: Secondary | ICD-10-CM | POA: Diagnosis not present

## 2015-01-15 ENCOUNTER — Ambulatory Visit (INDEPENDENT_AMBULATORY_CARE_PROVIDER_SITE_OTHER): Payer: Medicare Other | Admitting: Internal Medicine

## 2015-01-15 ENCOUNTER — Encounter: Payer: Self-pay | Admitting: Internal Medicine

## 2015-01-15 VITALS — BP 128/64 | HR 72 | Temp 97.3°F | Resp 16 | Ht 67.0 in | Wt 161.8 lb

## 2015-01-15 DIAGNOSIS — E1129 Type 2 diabetes mellitus with other diabetic kidney complication: Secondary | ICD-10-CM | POA: Diagnosis not present

## 2015-01-15 DIAGNOSIS — I150 Renovascular hypertension: Secondary | ICD-10-CM | POA: Diagnosis not present

## 2015-01-15 DIAGNOSIS — N184 Chronic kidney disease, stage 4 (severe): Secondary | ICD-10-CM | POA: Diagnosis not present

## 2015-01-15 DIAGNOSIS — Z79899 Other long term (current) drug therapy: Secondary | ICD-10-CM | POA: Diagnosis not present

## 2015-01-15 DIAGNOSIS — F329 Major depressive disorder, single episode, unspecified: Secondary | ICD-10-CM

## 2015-01-15 DIAGNOSIS — F32A Depression, unspecified: Secondary | ICD-10-CM

## 2015-01-15 DIAGNOSIS — Z6825 Body mass index (BMI) 25.0-25.9, adult: Secondary | ICD-10-CM

## 2015-01-15 DIAGNOSIS — E559 Vitamin D deficiency, unspecified: Secondary | ICD-10-CM

## 2015-01-15 DIAGNOSIS — E782 Mixed hyperlipidemia: Secondary | ICD-10-CM | POA: Diagnosis not present

## 2015-01-15 DIAGNOSIS — E1122 Type 2 diabetes mellitus with diabetic chronic kidney disease: Secondary | ICD-10-CM | POA: Diagnosis not present

## 2015-01-15 LAB — HEPATIC FUNCTION PANEL
AST: 18 U/L (ref 0–37)
Albumin: 4.2 g/dL (ref 3.5–5.2)
Alkaline Phosphatase: 52 U/L (ref 39–117)
Total Bilirubin: 0.5 mg/dL (ref 0.2–1.2)
Total Protein: 7.1 g/dL (ref 6.0–8.3)

## 2015-01-15 LAB — CBC WITH DIFFERENTIAL/PLATELET
BASOS PCT: 1 % (ref 0–1)
Basophils Absolute: 0.1 10*3/uL (ref 0.0–0.1)
EOS ABS: 0.3 10*3/uL (ref 0.0–0.7)
EOS PCT: 4 % (ref 0–5)
HCT: 41.3 % (ref 39.0–52.0)
Hemoglobin: 13.8 g/dL (ref 13.0–17.0)
LYMPHS PCT: 21 % (ref 12–46)
Lymphs Abs: 1.6 10*3/uL (ref 0.7–4.0)
MCH: 29.2 pg (ref 26.0–34.0)
MCHC: 33.4 g/dL (ref 30.0–36.0)
MCV: 87.5 fL (ref 78.0–100.0)
MPV: 9.1 fL (ref 8.6–12.4)
Monocytes Absolute: 0.5 10*3/uL (ref 0.1–1.0)
Monocytes Relative: 6 % (ref 3–12)
Neutro Abs: 5.2 10*3/uL (ref 1.7–7.7)
Neutrophils Relative %: 68 % (ref 43–77)
PLATELETS: 246 10*3/uL (ref 150–400)
RBC: 4.72 MIL/uL (ref 4.22–5.81)
RDW: 15 % (ref 11.5–15.5)
WBC: 7.7 10*3/uL (ref 4.0–10.5)

## 2015-01-15 LAB — HEMOGLOBIN A1C
Hgb A1c MFr Bld: 6 % — ABNORMAL HIGH (ref <5.7)
Mean Plasma Glucose: 126 mg/dL — ABNORMAL HIGH (ref <117)

## 2015-01-15 LAB — BASIC METABOLIC PANEL WITH GFR
BUN: 37 mg/dL — ABNORMAL HIGH (ref 6–23)
CO2: 29 mEq/L (ref 19–32)
Calcium: 9.7 mg/dL (ref 8.4–10.5)
Chloride: 101 mEq/L (ref 96–112)
Creat: 2.19 mg/dL — ABNORMAL HIGH (ref 0.50–1.35)
GFR, EST AFRICAN AMERICAN: 30 mL/min — AB
GFR, Est Non African American: 26 mL/min — ABNORMAL LOW
GLUCOSE: 90 mg/dL (ref 70–99)
POTASSIUM: 4.6 meq/L (ref 3.5–5.3)
Sodium: 139 mEq/L (ref 135–145)

## 2015-01-15 LAB — MAGNESIUM: MAGNESIUM: 2.2 mg/dL (ref 1.5–2.5)

## 2015-01-15 LAB — TSH: TSH: 1.721 u[IU]/mL (ref 0.350–4.500)

## 2015-01-15 NOTE — Patient Instructions (Signed)

## 2015-01-16 LAB — INSULIN, RANDOM: Insulin: 12.2 u[IU]/mL (ref 2.0–19.6)

## 2015-01-16 LAB — VITAMIN D 25 HYDROXY (VIT D DEFICIENCY, FRACTURES): Vit D, 25-Hydroxy: 79 ng/mL (ref 30–100)

## 2015-01-18 ENCOUNTER — Encounter: Payer: Self-pay | Admitting: Internal Medicine

## 2015-01-18 DIAGNOSIS — F329 Major depressive disorder, single episode, unspecified: Secondary | ICD-10-CM | POA: Insufficient documentation

## 2015-01-18 DIAGNOSIS — F32A Depression, unspecified: Secondary | ICD-10-CM | POA: Insufficient documentation

## 2015-01-18 NOTE — Progress Notes (Signed)
Patient ID: Gary Ortega, male   DOB: 1925/01/29, 81 y.o.   MRN: [id number]   This very nice 81 y.o. [place] presents for 3 month follow up with Hypertension, Hyperlipidemia, Pre-Diabetes and Vitamin D Deficiency.    Patient is treated for HTN since 1999  & BP has been controlled at home. Today's BP: 128/64 mmHg. Patient has had no complaints of any cardiac type chest pain, palpitations, dyspnea/orthopnea/PND, dizziness, claudication, or dependent edema.   Hyperlipidemia is controlled with diet & meds. Patient denies myalgias or other med SE's. Last Lipids were not at goal with elevated  Cholesterol 219*; HDL 28*; LDL Cholesterol 126* and with elevated Triglycerides 323 on 10/07/2014.   Also, the patient has history of PreDiabetes and has had no symptoms of reactive hypoglycemia, diabetic polys, paresthesias or visual blurring.  Last A1c was  6.0 on 01/15/2015.    Further, the patient also has history of Vitamin D Deficiency of 38 in 2008 and supplements vitamin D without any suspected side-effects. Current vitamin D is  79.     Medication Sig  . amLODipine (NORVASC) 5 MG tablet TAKE ONE TABLET BY MOUTH AT BEDTIME   . VITAMIN D 5000 UNITS CAPS Take by mouth daily.    . clopidogrel (PLAVIX) 75 MG tablet TAKE 1 TABLET DAILY  . Fish Oil OIL Take by mouth daily. 1 tablespoon   . THERAGRAN Take 1 tablet by mouth daily.    Marland Kitchen NITROSTA) 0.4 MG SL tablet prn  . vitamin C  500 MG tablet Take 500 mg by mouth daily.  Marland Kitchen SINEMET IR 25-250 MG  TAKE 1 TABLET TWICE A DAY   Allergies  Allergen Reactions  . Ace Inhibitors   . Lisinopril     Affects kidney  . Nsaids   . Wellbutrin [Bupropion]     confusion   PMHx:   Past Medical History  Diagnosis Date  . Hypertension   . Fatigue   . Shortness of breath   . Chest pain   . Vitamin D deficiency     unspec.  Marland Kitchen Anxiety   . Depressed   . Hyperlipidemia   . BPH (benign prostatic hypertrophy) with urinary obstruction   . History of kidney stones   .  H/O hemorrhoids    Immunization History  Administered Date(s) Administered  . Influenza Split 04/04/2013  . Influenza, High Dose Seasonal PF 03/27/2014  . Pneumococcal Polysaccharide-23 10/08/2013  . Pneumococcal-Unspecified 07/25/2002  . Td 07/25/2006   Past Surgical History  Procedure Laterality Date  . Colonoscopy    . Cystoscopy      prostate radioactive I-125 seed impklantation   . Mandible fracture surgery  1948  . Appendectomy    . Hemorrhoid surgery    . Cataract extraction    . Total nephrectomy Right 2012   FHx:    Reviewed / unchanged  SHx:    Reviewed / unchanged  Systems Review:  Constitutional: Denies fever, chills, wt changes, headaches, insomnia, fatigue, night sweats, change in appetite. Eyes: Denies redness, blurred vision, diplopia, discharge, itchy, watery eyes.  ENT: Denies discharge, congestion, post nasal drip, epistaxis, sore throat, earache, hearing loss, dental pain, tinnitus, vertigo, sinus pain, snoring.  CV: Denies chest pain, palpitations, irregular heartbeat, syncope, dyspnea, diaphoresis, orthopnea, PND, claudication or edema. Respiratory: denies cough, dyspnea, DOE, pleurisy, hoarseness, laryngitis, wheezing.  Gastrointestinal: Denies dysphagia, odynophagia, heartburn, reflux, water brash, abdominal pain or cramps, nausea, vomiting, bloating, diarrhea, constipation, hematemesis, melena, hematochezia  or hemorrhoids. Genitourinary: Denies dysuria,  frequency, urgency, nocturia, hesitancy, discharge, hematuria or flank pain. Musculoskeletal: Denies arthralgias, myalgias, stiffness, jt. swelling, pain, limping or strain/sprain.  Skin: Denies pruritus, rash, hives, warts, acne, eczema or change in skin lesion(s). Neuro: No weakness, tremor, incoordination, spasms, paresthesia or pain. Psychiatric: Denies confusion, memory loss or sensory loss. Endo: Denies change in weight, skin or hair change.  Heme/Lymph: No excessive bleeding, bruising or enlarged  lymph nodes.  Physical Exam  BP 128/64  Pulse 72  Temp 97.3 F   Resp 16  Ht 5\' 7"    Wt 161 lb 12.8 oz     BMI 25.34   Appears well nourished and in no distress. Eyes: PERRLA, EOMs, conjunctiva no swelling or erythema. Sinuses: No frontal/maxillary tenderness ENT/Mouth: EAC's clear, TM's nl w/o erythema, bulging. Nares clear w/o erythema, swelling, exudates. Oropharynx clear without erythema or exudates. Oral hygiene is good. Tongue normal, non obstructing. Hearing intact.  Neck: Supple. Thyroid nl. Car 2+/2+ without bruits, nodes or JVD. Chest: Respirations nl with BS clear & equal w/o rales, rhonchi, wheezing or stridor.  Cor: Heart sounds normal w/ regular rate and rhythm without sig. murmurs, gallops, clicks, or rubs. Peripheral pulses normal and equal  without edema.  Abdomen: Soft & bowel sounds normal. Non-tender w/o guarding, rebound, hernias, masses, or organomegaly.  Lymphatics: Unremarkable.  Musculoskeletal: Full ROM all peripheral extremities, joint stability, 5/5 strength, and normal gait.  Skin: Warm, dry without exposed rashes, lesions or ecchymosis apparent.  Neuro: Cranial nerves intact, reflexes equal bilaterally. Sensory-motor testing grossly intact. Tendon reflexes grossly intact.  Pysch: Alert & oriented x 3.  Insight and judgement nl & appropriate. No ideations.  Assessment and Plan:  1. Renovascular hypertension   2. Hyperlipidemia  - TSH  3. 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 Vitamin D deficiency  - Vit D  25 hydroxy  5. Medication management  - CBC with Differential/Platelet - BASIC METABOLIC PANEL WITH GFR - Hepatic function panel - Magnesium   Recommended regular exercise, BP monitoring, weight control, and discussed med and SE's. Recommended labs to assess and monitor clinical status. Further disposition pending results of labs. Over 30 minutes of exam, counseling, chart review was performed

## 2015-02-18 ENCOUNTER — Other Ambulatory Visit: Payer: Self-pay | Admitting: Internal Medicine

## 2015-02-18 ENCOUNTER — Telehealth: Payer: Self-pay | Admitting: *Deleted

## 2015-02-18 MED ORDER — LOSARTAN POTASSIUM 100 MG PO TABS: ORAL_TABLET | ORAL | Status: DC

## 2015-02-18 NOTE — Telephone Encounter (Signed)
Patient 's daughter called and states his BP has been high.  The daughter increased his Amlodipine to 1 whole tab and the Metoprolol remains at the same dose.  Per Dr Melford Aase, he sent in an RX for Losartan 100 mg  And daughter instructed to start the med at 1/2 tab daily and continue to monitor patient's BP.

## 2015-03-17 DIAGNOSIS — L700 Acne vulgaris: Secondary | ICD-10-CM | POA: Diagnosis not present

## 2015-03-17 DIAGNOSIS — L219 Seborrheic dermatitis, unspecified: Secondary | ICD-10-CM | POA: Diagnosis not present

## 2015-03-17 DIAGNOSIS — D485 Neoplasm of uncertain behavior of skin: Secondary | ICD-10-CM | POA: Diagnosis not present

## 2015-03-17 DIAGNOSIS — Z85828 Personal history of other malignant neoplasm of skin: Secondary | ICD-10-CM | POA: Diagnosis not present

## 2015-03-17 DIAGNOSIS — L821 Other seborrheic keratosis: Secondary | ICD-10-CM | POA: Diagnosis not present

## 2015-03-17 DIAGNOSIS — C44329 Squamous cell carcinoma of skin of other parts of face: Secondary | ICD-10-CM | POA: Diagnosis not present

## 2015-04-02 ENCOUNTER — Ambulatory Visit (INDEPENDENT_AMBULATORY_CARE_PROVIDER_SITE_OTHER): Payer: Medicare Other | Admitting: Internal Medicine

## 2015-04-02 ENCOUNTER — Encounter: Payer: Self-pay | Admitting: Internal Medicine

## 2015-04-02 VITALS — BP 142/84 | HR 80 | Temp 97.5°F | Resp 16 | Ht 67.0 in | Wt 167.4 lb

## 2015-04-02 DIAGNOSIS — G20 Parkinson's disease: Secondary | ICD-10-CM | POA: Diagnosis not present

## 2015-04-02 DIAGNOSIS — I2510 Atherosclerotic heart disease of native coronary artery without angina pectoris: Secondary | ICD-10-CM | POA: Diagnosis not present

## 2015-04-02 DIAGNOSIS — Z789 Other specified health status: Secondary | ICD-10-CM | POA: Diagnosis not present

## 2015-04-02 DIAGNOSIS — Z1331 Encounter for screening for depression: Secondary | ICD-10-CM

## 2015-04-02 DIAGNOSIS — Z79899 Other long term (current) drug therapy: Secondary | ICD-10-CM | POA: Diagnosis not present

## 2015-04-02 DIAGNOSIS — F329 Major depressive disorder, single episode, unspecified: Secondary | ICD-10-CM

## 2015-04-02 DIAGNOSIS — Z1389 Encounter for screening for other disorder: Secondary | ICD-10-CM | POA: Diagnosis not present

## 2015-04-02 DIAGNOSIS — Z1212 Encounter for screening for malignant neoplasm of rectum: Secondary | ICD-10-CM

## 2015-04-02 DIAGNOSIS — E782 Mixed hyperlipidemia: Secondary | ICD-10-CM | POA: Diagnosis not present

## 2015-04-02 DIAGNOSIS — E1122 Type 2 diabetes mellitus with diabetic chronic kidney disease: Secondary | ICD-10-CM | POA: Diagnosis not present

## 2015-04-02 DIAGNOSIS — N184 Chronic kidney disease, stage 4 (severe): Secondary | ICD-10-CM | POA: Diagnosis not present

## 2015-04-02 DIAGNOSIS — Z6826 Body mass index (BMI) 26.0-26.9, adult: Secondary | ICD-10-CM

## 2015-04-02 DIAGNOSIS — G20A1 Parkinson's disease without dyskinesia, without mention of fluctuations: Secondary | ICD-10-CM

## 2015-04-02 DIAGNOSIS — E1129 Type 2 diabetes mellitus with other diabetic kidney complication: Secondary | ICD-10-CM | POA: Diagnosis not present

## 2015-04-02 DIAGNOSIS — G450 Vertebro-basilar artery syndrome: Secondary | ICD-10-CM

## 2015-04-02 DIAGNOSIS — E559 Vitamin D deficiency, unspecified: Secondary | ICD-10-CM | POA: Diagnosis not present

## 2015-04-02 DIAGNOSIS — F32A Depression, unspecified: Secondary | ICD-10-CM

## 2015-04-02 DIAGNOSIS — N320 Bladder-neck obstruction: Secondary | ICD-10-CM

## 2015-04-02 DIAGNOSIS — I10 Essential (primary) hypertension: Secondary | ICD-10-CM

## 2015-04-02 DIAGNOSIS — Z125 Encounter for screening for malignant neoplasm of prostate: Secondary | ICD-10-CM

## 2015-04-02 DIAGNOSIS — Z9181 History of falling: Secondary | ICD-10-CM

## 2015-04-02 LAB — CBC WITH DIFFERENTIAL/PLATELET
BASOS ABS: 0.1 10*3/uL (ref 0.0–0.1)
Basophils Relative: 1 % (ref 0–1)
Eosinophils Absolute: 0.4 10*3/uL (ref 0.0–0.7)
Eosinophils Relative: 4 % (ref 0–5)
HCT: 43.6 % (ref 39.0–52.0)
HEMOGLOBIN: 14.8 g/dL (ref 13.0–17.0)
LYMPHS PCT: 18 % (ref 12–46)
Lymphs Abs: 1.7 10*3/uL (ref 0.7–4.0)
MCH: 29.8 pg (ref 26.0–34.0)
MCHC: 33.9 g/dL (ref 30.0–36.0)
MCV: 87.7 fL (ref 78.0–100.0)
MONO ABS: 0.8 10*3/uL (ref 0.1–1.0)
MPV: 9.3 fL (ref 8.6–12.4)
Monocytes Relative: 9 % (ref 3–12)
NEUTROS ABS: 6.3 10*3/uL (ref 1.7–7.7)
NEUTROS PCT: 68 % (ref 43–77)
Platelets: 259 10*3/uL (ref 150–400)
RBC: 4.97 MIL/uL (ref 4.22–5.81)
RDW: 14.1 % (ref 11.5–15.5)
WBC: 9.3 10*3/uL (ref 4.0–10.5)

## 2015-04-02 LAB — TSH: TSH: 1.98 u[IU]/mL (ref 0.350–4.500)

## 2015-04-02 NOTE — Patient Instructions (Signed)

## 2015-04-02 NOTE — Progress Notes (Signed)
Patient ID: Gary Ortega, male   DOB: Sep 09, 1924, 81 y.o.   MRN: [id number]   Comprehensive Examination  This very nice 81 y.o. [name] presents for complete physical.  Patient has been followed for HTN, T2_NIDDM  Prediabetes, Hyperlipidemia, and Vitamin D Deficiency. Patient has hx/o Prostate Ca treated by Brachytherapy in 2008 and has been followed by Dr Gary Ortega. 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 Patient has consequent Stage 4 CKD (GFR26 ml/min) attributed also to underlying ASCVD and T2_DM. He's followed by Dr Gary Ortega.    HTN predates since 1999. Patient's BP has been controlled at home.Today's BP: (!) 142/84 mmHg. Patient denies any cardiac symptoms as chest pain, palpitations, shortness of breath, dizziness or ankle swelling. In 2010 patient had a TIA and then a CVA in 2012 with full recovery. In 2011 he had a heart cath with non obstructive CAD (<50%). IN 2014 patient was started on tx for Parkinson's Dz.   Patient's hyperlipidemia is controlled with diet and medications. Patient denies myalgias or other medication SE's. Last lipids were not at goal - Cholesterol 219*; HDL 28*; LDL 126*; and with elevatedTriglycerides 323 on 10/07/2014.   Patient has T2_NIDDM since 2010 with A1c 6.6%, then 5.8% in 2012 and later 6.2% in 2014.  Patient denies reactive hypoglycemic symptoms, visual blurring, diabetic polys or paresthesias. Last A1c was 6.0% on 01/15/2015.   Finally, patient has history of Vitamin D Deficiency of 38 in 2008  and last vitamin D was 79 on 01/15/2015.   Medication Sig  . amLODipine (NORVASC) 5 MG tablet TAKE ONE TABL IN AM   . carbidopa-levodopa (SINEMET IR) 25-250 MG Take 1 tablet  3  times daily.  Marland Kitchen VITAMIN D 5000 UNITS  Take by mouth daily.    . clopidogrel (PLAVIX) 75 MG tablet TAKE 1 TABLET DAILY TO     PREVENT STROKE  . Fish Oil OIL Take by mouth daily. 1 tablespoon   . losartan (COZAAR) 100 MG tablet Take 1/2 to 1 tablet daily for BP as directed  .  metoprolol succinate -XL) 25 MG  1/2-1 pill at night for blood pressure  . multivitamin (THERAGRAN) per tablet Take 1 tablet by mouth daily.    Marland Kitchen NITROSTAT 0.4 MG SL  Place 1 tab under  tongue every 5  minutes as needed   . vitamin C (ASCORBIC ACID) 500 MG tablet Take 500 mg by mouth daily.   Allergies  Allergen Reactions  . Ace Inhibitors   . Lisinopril     Affects kidney  . Nsaids   . Wellbutrin [Bupropion]     confusion   Past Medical History  Diagnosis Date  . BPH (benign prostatic hypertrophy) with urinary obstruction    Health Maintenance  Topic Date Due  . OPHTHALMOLOGY EXAM  05/25/1937  . ZOSTAVAX  05/26/1987  . PNA vac Low Risk Adult (2 of 2 - PCV13) 10/09/2014  . INFLUENZA VACCINE  02/23/2015  . URINE MICROALBUMIN  03/28/2015  . HEMOGLOBIN A1C  07/17/2015  . COLONOSCOPY  07/26/2015  . FOOT EXAM  10/07/2015  . TETANUS/TDAP  07/25/2016   Immunization History  Administered Date(s) Administered  . Influenza Split 04/04/2013  . Influenza, High Dose Seasonal PF 03/27/2014  . Pneumococcal Polysaccharide-23 10/08/2013  . Pneumococcal-Unspecified 07/25/2002  . Td 07/25/2006   Past Surgical History  Procedure Laterality Date  . Colonoscopy    . Cystoscopy      prostate radioactive I-125 seed impklantation   . Mandible fracture  surgery  1948  . Appendectomy    . Hemorrhoid surgery    . Cataract extraction    . Total nephrectomy Right 2012   Family History  Problem Relation Age of Onset  . Stroke Mother   . Hypertension Mother   . Stroke Father   . Hypertension Brother    Social History   Social History  . Marital Status: Widowed    Spouse Name: N/A  . Number of Children: N/A  . Years of Education: N/A   Occupational History  . Not on file.   Social History Main Topics  . Smoking status: Never Smoker   . Smokeless tobacco: Not on file  . Alcohol Use: No  . Drug Use: Not on file  . Sexual Activity: Not on file   Other Topics Concern  . Not on  file   Social History Narrative   Married 63 years. Retired form [employer]    ROS Constitutional: Denies fever, chills, weight loss/gain, headaches, insomnia,  night sweats or change in appetite. Does c/o fatigue. Eyes: Denies redness, blurred vision, diplopia, discharge, itchy or watery eyes.  ENT: Denies discharge, congestion, post nasal drip, epistaxis, sore throat, earache, hearing loss, dental pain, Tinnitus, Vertigo, Sinus pain or snoring.  Cardio: Denies chest pain, palpitations, irregular heartbeat, syncope, dyspnea, diaphoresis, orthopnea, PND, claudication or edema Respiratory: denies cough, dyspnea, DOE, pleurisy, hoarseness, laryngitis or wheezing.  Gastrointestinal: Denies dysphagia, heartburn, reflux, water brash, pain, cramps, nausea, vomiting, bloating, diarrhea, constipation, hematemesis, melena, hematochezia, jaundice or hemorrhoids Genitourinary: Denies dysuria, frequency, urgency, nocturia, hesitancy, discharge, hematuria or flank pain Musculoskeletal: Denies arthralgia, myalgia, stiffness, Jt. Swelling, pain, limp or strain/sprain. Denies Falls. Skin: Denies puritis, rash, hives, warts, acne, eczema or change in skin lesion Neuro: No weakness, tremor, incoordination, spasms, paresthesia or pain Psychiatric: Denies confusion, memory loss or sensory loss. Denies Depression. Endocrine: Denies change in weight, skin, hair change, nocturia, and paresthesia, diabetic polys, visual blurring or hyper / hypo glycemic episodes.  Heme/Lymph: No excessive bleeding, bruising or enlarged lymph nodes.  Physical Exam  BP 142/84 mmHg  Pulse 80  Temp(Src) 97.5 F (36.4 C)  Resp 16  Ht 5\' 7"  (1.702 m)  Wt 167 lb 6.4 oz (75.932 kg)  BMI 26.21 kg/m2  General Appearance: Well nourished &  in no apparent distress. Eyes: PERRLA, EOMs, conjunctiva no swelling or erythema, normal fundi and vessels. Sinuses: No frontal/maxillary tenderness ENT/Mouth: EACs patent / TMs  nl. Nares clear  without erythema, swelling, mucoid exudates. Oral hygiene is good. No erythema, swelling, or exudate. Tongue normal, non-obstructing. Tonsils not swollen or erythematous. Hearing normal.  Neck: Supple, thyroid normal. No bruits, nodes or JVD. Respiratory: Respiratory effort normal.  BS equal and clear bilateral without rales, rhonci, wheezing or stridor. Cardio: Heart sounds are normal with regular rate and rhythm and no murmurs, rubs or gallops. Peripheral pulses are normal and equal bilaterally without edema. No aortic or femoral bruits. Chest: symmetric with normal excursions and percussion.  Abdomen: Flat, soft, with bowel sounds. Nontender, no guarding, rebound, hernias, masses, or organomegaly.  Lymphatics: Non tender without lymphadenopathy.  Genitourinary:  Deferred for age. Musculoskeletal: Full ROM all peripheral extremities, joint stability, 5/5 strength and shuffling gait. Decreased arm swing.  Skin: Warm and dry without rashes, lesions, cyanosis, clubbing or  ecchymosis.  Neuro: Cranial nerves intact, reflexes equal bilaterally. Masked facies.Increased  muscle tone, no cerebellar symptoms. Sensation intact. Mild bilat pill rolling tremors.  Pysch: Alert and oriented X 3 with normal affect, insight  and judgment appropriate.   Assessment and Plan  1. Essential hypertension  - EKG 12-Lead - [place], RETROPERITNL ABD,  LTD - TSH  2. T2_NIDDM w/ CKD 4 (GFR 26 ml/min)  - Microalbumin / creatinine urine ratio - Hemoglobin A1c - Insulin, random  3. Hyperlipidemia  - Lipid panel  4. Vitamin D deficiency  - Vit D  25 hydroxy (rtn osteoporosis monitoring)  5. Depression, controlled   6. Parkinson's disease   7. Vertebrobasilar artery syndrome   8. ASHD    9. Screening for rectal cancer  - POC Hemoccult Bld/Stl   10. Prostate cancer (2008)    11. Depression screen   12. At low risk for fall   13. Medication management  - Urinalysis, Routine w reflex  microscopic  - CBC with Differential/Platelet - BASIC METABOLIC PANEL WITH GFR - Hepatic function panel - Magnesium  14. Bladder neck obstruction  - PSA   Continue prudent diet as discussed, weight control, BP monitoring, regular exercise, and medications as discussed.  Discussed med effects and SE's. Routine screening labs and tests as requested with regular follow-up as recommended.  Over 40 minutes of exam, counseling &  chart review was performed

## 2015-04-03 LAB — HEPATIC FUNCTION PANEL
ALT: 8 U/L — AB (ref 9–46)
AST: 20 U/L (ref 10–35)
Albumin: 4.9 g/dL (ref 3.6–5.1)
Alkaline Phosphatase: 58 U/L (ref 40–115)
BILIRUBIN DIRECT: 0.1 mg/dL (ref <=0.2)
BILIRUBIN INDIRECT: 0.5 mg/dL (ref 0.2–1.2)
TOTAL PROTEIN: 8 g/dL (ref 6.1–8.1)
Total Bilirubin: 0.6 mg/dL (ref 0.2–1.2)

## 2015-04-03 LAB — BASIC METABOLIC PANEL WITH GFR
BUN: 47 mg/dL — ABNORMAL HIGH (ref 7–25)
CO2: 25 mmol/L (ref 20–31)
Calcium: 9.9 mg/dL (ref 8.6–10.3)
Chloride: 98 mmol/L (ref 98–110)
Creat: 2.28 mg/dL — ABNORMAL HIGH (ref 0.70–1.11)
GFR, EST NON AFRICAN AMERICAN: 25 mL/min — AB (ref >=60)
GFR, Est African American: 29 mL/min — ABNORMAL LOW (ref >=60)
Glucose, Bld: 105 mg/dL — ABNORMAL HIGH (ref 65–99)
POTASSIUM: 5.1 mmol/L (ref 3.5–5.3)
SODIUM: 137 mmol/L (ref 135–146)

## 2015-04-03 LAB — URINALYSIS, MICROSCOPIC ONLY
Bacteria, UA: NONE SEEN [HPF]
Casts: NONE SEEN [LPF]
Crystals: NONE SEEN [HPF]
RBC / HPF: NONE SEEN RBC/HPF (ref <=2)
Squamous Epithelial / LPF: NONE SEEN [HPF] (ref <=5)
WBC, UA: NONE SEEN WBC/HPF (ref <=5)
Yeast: NONE SEEN [HPF]

## 2015-04-03 LAB — URINALYSIS, ROUTINE W REFLEX MICROSCOPIC
BILIRUBIN URINE: NEGATIVE
Glucose, UA: NEGATIVE
Hgb urine dipstick: NEGATIVE
Ketones, ur: NEGATIVE
Leukocytes, UA: NEGATIVE
Nitrite: NEGATIVE
Specific Gravity, Urine: 1.011 (ref 1.001–1.035)
pH: 6 (ref 5.0–8.0)

## 2015-04-03 LAB — LIPID PANEL
CHOL/HDL RATIO: 7.9 ratio — AB (ref <=5.0)
Cholesterol: 245 mg/dL — ABNORMAL HIGH (ref 125–200)
HDL: 31 mg/dL — ABNORMAL LOW (ref >=40)
Triglycerides: 490 mg/dL — ABNORMAL HIGH (ref <150)

## 2015-04-03 LAB — VITAMIN D 25 HYDROXY (VIT D DEFICIENCY, FRACTURES): Vit D, 25-Hydroxy: 73 ng/mL (ref 30–100)

## 2015-04-03 LAB — HEMOGLOBIN A1C
Hgb A1c MFr Bld: 6.1 % — ABNORMAL HIGH (ref <5.7)
Mean Plasma Glucose: 128 mg/dL — ABNORMAL HIGH (ref <117)

## 2015-04-03 LAB — MICROALBUMIN / CREATININE URINE RATIO
Creatinine, Urine: 61.6 mg/dL
MICROALB UR: 19.1 mg/dL — AB (ref <2.0)
Microalb Creat Ratio: 310.1 mg/g — ABNORMAL HIGH (ref 0.0–30.0)

## 2015-04-03 LAB — MAGNESIUM: Magnesium: 2.4 mg/dL (ref 1.5–2.5)

## 2015-04-03 LAB — PSA: PSA: 0.05 ng/mL (ref <=4.00)

## 2015-04-03 LAB — INSULIN, RANDOM: Insulin: 19.5 u[IU]/mL (ref 2.0–19.6)

## 2015-04-09 ENCOUNTER — Other Ambulatory Visit: Payer: Self-pay | Admitting: Physician Assistant

## 2015-04-15 ENCOUNTER — Other Ambulatory Visit: Payer: Self-pay | Admitting: *Deleted

## 2015-04-15 DIAGNOSIS — Z1212 Encounter for screening for malignant neoplasm of rectum: Secondary | ICD-10-CM

## 2015-04-15 LAB — POC HEMOCCULT BLD/STL (HOME/3-CARD/SCREEN)
Card #2 Fecal Occult Blod, POC: NEGATIVE
Card #3 Fecal Occult Blood, POC: NEGATIVE
Fecal Occult Blood, POC: NEGATIVE

## 2015-04-21 DIAGNOSIS — L908 Other atrophic disorders of skin: Secondary | ICD-10-CM | POA: Diagnosis not present

## 2015-04-21 DIAGNOSIS — C44329 Squamous cell carcinoma of skin of other parts of face: Secondary | ICD-10-CM | POA: Diagnosis not present

## 2015-04-21 DIAGNOSIS — L578 Other skin changes due to chronic exposure to nonionizing radiation: Secondary | ICD-10-CM | POA: Diagnosis not present

## 2015-04-21 DIAGNOSIS — C801 Malignant (primary) neoplasm, unspecified: Secondary | ICD-10-CM | POA: Diagnosis not present

## 2015-04-28 ENCOUNTER — Encounter: Payer: Self-pay | Admitting: Internal Medicine

## 2015-05-07 DIAGNOSIS — Z23 Encounter for immunization: Secondary | ICD-10-CM | POA: Diagnosis not present

## 2015-05-08 ENCOUNTER — Other Ambulatory Visit: Payer: Self-pay | Admitting: Internal Medicine

## 2015-05-08 DIAGNOSIS — G20 Parkinson's disease: Secondary | ICD-10-CM

## 2015-05-08 MED ORDER — CARBIDOPA-LEVODOPA 25-250 MG PO TABS: 1.0000 | ORAL_TABLET | Freq: Three times a day (TID) | ORAL | Status: AC

## 2015-05-17 ENCOUNTER — Emergency Department
Admission: EM | Admit: 2015-05-17 | Discharge: 2015-05-18 | Disposition: A | Discharge status: Home / Self Care | Payer: Medicare Other | Attending: Emergency Medicine | Admitting: Emergency Medicine

## 2015-05-17 ENCOUNTER — Emergency Department: Payer: Medicare Other

## 2015-05-17 DIAGNOSIS — N184 Chronic kidney disease, stage 4 (severe): Secondary | ICD-10-CM | POA: Diagnosis not present

## 2015-05-17 DIAGNOSIS — R1031 Right lower quadrant pain: Secondary | ICD-10-CM | POA: Diagnosis not present

## 2015-05-17 DIAGNOSIS — S3991XA Unspecified injury of abdomen, initial encounter: Secondary | ICD-10-CM | POA: Insufficient documentation

## 2015-05-17 DIAGNOSIS — E119 Type 2 diabetes mellitus without complications: Secondary | ICD-10-CM | POA: Diagnosis not present

## 2015-05-17 DIAGNOSIS — R339 Retention of urine, unspecified: Secondary | ICD-10-CM | POA: Diagnosis not present

## 2015-05-17 DIAGNOSIS — Z79899 Other long term (current) drug therapy: Secondary | ICD-10-CM | POA: Insufficient documentation

## 2015-05-17 DIAGNOSIS — I129 Hypertensive chronic kidney disease with stage 1 through stage 4 chronic kidney disease, or unspecified chronic kidney disease: Secondary | ICD-10-CM | POA: Diagnosis not present

## 2015-05-17 DIAGNOSIS — W1839XA Other fall on same level, initial encounter: Secondary | ICD-10-CM | POA: Diagnosis not present

## 2015-05-17 DIAGNOSIS — R338 Other retention of urine: Secondary | ICD-10-CM

## 2015-05-17 DIAGNOSIS — Y9301 Activity, walking, marching and hiking: Secondary | ICD-10-CM | POA: Insufficient documentation

## 2015-05-17 DIAGNOSIS — S066X0A Traumatic subarachnoid hemorrhage without loss of consciousness, initial encounter: Secondary | ICD-10-CM | POA: Diagnosis not present

## 2015-05-17 DIAGNOSIS — S299XXA Unspecified injury of thorax, initial encounter: Secondary | ICD-10-CM | POA: Diagnosis not present

## 2015-05-17 DIAGNOSIS — E1122 Type 2 diabetes mellitus with diabetic chronic kidney disease: Secondary | ICD-10-CM | POA: Diagnosis not present

## 2015-05-17 DIAGNOSIS — Y998 Other external cause status: Secondary | ICD-10-CM | POA: Insufficient documentation

## 2015-05-17 DIAGNOSIS — I609 Nontraumatic subarachnoid hemorrhage, unspecified: Secondary | ICD-10-CM | POA: Diagnosis not present

## 2015-05-17 DIAGNOSIS — Y92009 Unspecified place in unspecified non-institutional (private) residence as the place of occurrence of the external cause: Secondary | ICD-10-CM | POA: Diagnosis not present

## 2015-05-17 DIAGNOSIS — S0990XA Unspecified injury of head, initial encounter: Secondary | ICD-10-CM | POA: Diagnosis not present

## 2015-05-17 HISTORY — DX: Parkinson's disease: G20

## 2015-05-17 HISTORY — DX: Malignant (primary) neoplasm, unspecified: C80.1

## 2015-05-17 HISTORY — DX: Parkinson's disease without dyskinesia, without mention of fluctuations: G20.A1

## 2015-05-17 HISTORY — DX: Cerebral infarction, unspecified: I63.9

## 2015-05-17 HISTORY — DX: Disorder of kidney and ureter, unspecified: N28.9

## 2015-05-17 LAB — URINALYSIS COMPLETE WITH MICROSCOPIC (ARMC ONLY)
Bacteria, UA: NONE SEEN
Bilirubin Urine: NEGATIVE
GLUCOSE, UA: 50 mg/dL — AB
Hgb urine dipstick: NEGATIVE
KETONES UR: NEGATIVE mg/dL
LEUKOCYTES UA: NEGATIVE
NITRITE: NEGATIVE
Protein, ur: 30 mg/dL — AB
SPECIFIC GRAVITY, URINE: 1.014 (ref 1.005–1.030)
WBC, UA: NONE SEEN WBC/hpf (ref 0–5)
pH: 5 (ref 5.0–8.0)

## 2015-05-17 LAB — CBC WITH DIFFERENTIAL/PLATELET
BASOS ABS: 0.2 10*3/uL — AB (ref 0–0.1)
BASOS PCT: 1 %
EOS ABS: 0.1 10*3/uL (ref 0–0.7)
Eosinophils Relative: 0 %
HCT: 39.2 % — ABNORMAL LOW (ref 40.0–52.0)
Hemoglobin: 13.3 g/dL (ref 13.0–18.0)
Lymphocytes Relative: 3 %
Lymphs Abs: 0.7 10*3/uL — ABNORMAL LOW (ref 1.0–3.6)
MCH: 30 pg (ref 26.0–34.0)
MCHC: 33.9 g/dL (ref 32.0–36.0)
MCV: 88.6 fL (ref 80.0–100.0)
MONO ABS: 1.4 10*3/uL — AB (ref 0.2–1.0)
Monocytes Relative: 7 %
Neutro Abs: 19.3 10*3/uL — ABNORMAL HIGH (ref 1.4–6.5)
Neutrophils Relative %: 89 %
PLATELETS: 247 10*3/uL (ref 150–440)
RBC: 4.42 MIL/uL (ref 4.40–5.90)
RDW: 13.8 % (ref 11.5–14.5)
WBC: 21.6 10*3/uL — ABNORMAL HIGH (ref 3.8–10.6)

## 2015-05-17 LAB — BASIC METABOLIC PANEL
Anion gap: 10 (ref 5–15)
BUN: 60 mg/dL — AB (ref 6–20)
CALCIUM: 9.1 mg/dL (ref 8.9–10.3)
CO2: 21 mmol/L — ABNORMAL LOW (ref 22–32)
Chloride: 103 mmol/L (ref 101–111)
Creatinine, Ser: 2.89 mg/dL — ABNORMAL HIGH (ref 0.61–1.24)
GFR calc Af Amer: 21 mL/min — ABNORMAL LOW (ref >60)
GFR, EST NON AFRICAN AMERICAN: 18 mL/min — AB (ref >60)
GLUCOSE: 146 mg/dL — AB (ref 65–99)
Potassium: 5.1 mmol/L (ref 3.5–5.1)
Sodium: 134 mmol/L — ABNORMAL LOW (ref 135–145)

## 2015-05-17 MED ORDER — SODIUM CHLORIDE 0.9 % IV BOLUS (SEPSIS)
500.0000 mL | Freq: Once | INTRAVENOUS | Status: AC
  Administered 2015-05-18: 500 mL via INTRAVENOUS

## 2015-05-17 NOTE — ED Provider Notes (Signed)
[facility] Emergency Department Provider Note  ____________________________________________  Time seen: 10:00 PM on arrival by EMS  I have reviewed the triage vital signs and the nursing notes.   HISTORY  Chief Complaint Fall    HPI Gary Ortega is a 81 y.o. male is brought to the ED due to 2 unwitnessed falls at home. Patient reports that he was walking back inside after taking the dog out when he felt like his legs were weak and that his legs just gave out. He denies any loss of consciousness. He says that he did hit his head on the ground but it was a carpeted floor and he has no headache. No numbness tingling or weakness. He does have Parkinson's which appears to be at its baseline according to the patient. Denies any muscle skeletal pain. His only other acute complaint is that he has a history of just a single kidney and normally urinates every 2-3 hours, but today he has not urinated for about 12 hours. He does complain of lower abdominal discomfort.     Past Medical History  Diagnosis Date  . BPH (benign prostatic hypertrophy) with urinary obstruction   . CVA (cerebral infarction)   . Hypertension   . Parkinson disease ([place])   . Renal disorder   . Stroke ([place])   . Cancer [facility])      Patient Active Problem List   Diagnosis Date Noted  . ASHD  04/02/2015  . Depression, controlled 01/18/2015  . T2_NIDDM w/ CKD 4 (GFR 26 ml/min) 01/15/2015  . History of nephrectomy, unilateral 10/07/2014  . Parkinson's disease ([place]) 03/27/2014  . Medication management 11/29/2013  . BPH (benign prostatic hypertrophy) with urinary obstruction   . TIA (transient ischemic attack) 06/30/2011  . Hyperlipidemia 06/03/2010  . Vitamin D deficiency 06/02/2010  . Essential hypertension 06/02/2010     Past Surgical History  Procedure Laterality Date  . Colonoscopy    . Cystoscopy      prostate radioactive I-125 seed impklantation   . Mandible fracture  surgery  1948  . Appendectomy    . Hemorrhoid surgery    . Cataract extraction    . Total nephrectomy Right 2012     Current Outpatient Rx  Name  Route  Sig  Dispense  Refill  . amLODipine (NORVASC) 5 MG tablet      TAKE ONE TABLET BY MOUTH AT BEDTIME FOR BLOOD PRESSURE   90 tablet   0   . carbidopa-levodopa (SINEMET IR) 25-250 MG tablet   Oral   Take 1 tablet by mouth 3 (three) times daily.   270 tablet   1   . Cholecalciferol (VITAMIN D3) 5000 UNITS CAPS   Oral   Take by mouth daily.           . clopidogrel (PLAVIX) 75 MG tablet      TAKE 1 TABLET DAILY TO     PREVENT STROKE   90 tablet   3   . Fish Oil OIL   Oral   Take by mouth daily. 1 tablespoon          . ketoconazole (NIZORAL) 2 % shampoo               . LOCOID 0.1 % LOTN                 Dispense as written.   Marland Kitchen losartan (COZAAR) 100 MG tablet      Take 1/2 to 1 tablet daily for  BP as directed   30 tablet   99   . metoprolol succinate (TOPROL-XL) 25 MG 24 hr tablet      1/2-1 pill at night for blood pressure   90 tablet   1   . multivitamin (THERAGRAN) per tablet   Oral   Take 1 tablet by mouth daily.           . nitroGLYCERIN (NITROSTAT) 0.4 MG SL tablet   Sublingual   Place 1 tablet (0.4 mg total) under the tongue every 5 (five) minutes as needed for chest pain. One tablet under tongue at onset of chest pain; may repeat every 5 minutes for up to 3 doses   30 tablet   PRN   . vitamin C (ASCORBIC ACID) 500 MG tablet   Oral   Take 500 mg by mouth daily.            Allergies Ace inhibitors; Lisinopril; Nsaids; and Wellbutrin   Family History  Problem Relation Age of Onset  . Stroke Mother   . Hypertension Mother   . Stroke Father   . Hypertension Brother     Social History Social History  Substance Use Topics  . Smoking status: Never Smoker   . Smokeless tobacco: None  . Alcohol Use: No    Review of Systems  Constitutional:   No fever or chills. No  weight changes Eyes:   No blurry vision or double vision.  ENT:   No sore throat. Cardiovascular:   No chest pain. Respiratory:   No dyspnea or cough. Gastrointestinal:   Negative for abdominal pain, vomiting and diarrhea.  No BRBPR or melena. Genitourinary:   Positive difficulty urinating. Musculoskeletal:   Negative for back pain. No joint swelling or pain. Skin:   Negative for rash. Neurological:   Negative for headaches, focal weakness or numbness. Psychiatric:  No anxiety or depression.   Endocrine:  No hot/cold intolerance, changes in energy, or sleep difficulty.  10-point ROS otherwise negative.  ____________________________________________   PHYSICAL EXAM:  VITAL SIGNS: ED Triage Vitals  Enc Vitals Group     BP 05/17/15 2220 155/86 mmHg     Pulse --      Resp 05/17/15 2212 14     Temp 05/17/15 2212 98.7 F (37.1 C)     Temp Source 05/17/15 2212 Oral     SpO2 --      Weight 05/17/15 2212 155 lb (70.308 kg)     Height 05/17/15 2212 5\' 8"  (1.727 m)     Head Cir --      Peak Flow --      Pain Score 05/17/15 2214 0     Pain Loc --      Pain Edu? --      Excl. in [place]? --      Constitutional:   Alert and oriented. Well appearing and in no distress. Eyes:   No scleral icterus. No conjunctival pallor. PERRL. EOMI ENT   Head:   Normocephalic and atraumatic. No contusions or abrasions   Nose:   No congestion/rhinnorhea. No septal hematoma   Mouth/Throat:   MMM, no pharyngeal erythema. No peritonsillar mass. No uvula shift.   Neck:   No stridor. No SubQ emphysema. No meningismus. Hematological/Lymphatic/Immunilogical:   No cervical lymphadenopathy. Cardiovascular:   RRR. Normal and symmetric distal pulses are present in all extremities. No murmurs, rubs, or gallops. Respiratory:   Normal respiratory effort without tachypnea nor retractions. Breath sounds are clear and equal bilaterally. No  wheezes/rales/rhonchi. Gastrointestinal:   Soft with suprapubic  tenderness and fullness. No distention. There is no CVA tenderness.  No rebound, rigidity, or guarding. Genitourinary:   deferred Musculoskeletal:   Nontender with normal range of motion in all extremities. No joint effusions.  No lower extremity tenderness.  No edema. Neurologic:   Normal speech and language.  CN 2-10 normal. Motor grossly intact. No pronator drift.  Normal gait. No gross focal neurologic deficits are appreciated.  Skin:    Skin is warm, dry and intact. No rash noted.  No petechiae, purpura, or bullae. Psychiatric:   Mood and affect are normal. Speech and behavior are normal. Patient exhibits appropriate insight and judgment.  ____________________________________________    LABS (pertinent positives/negatives) (all labs ordered are listed, but only abnormal results are displayed) Labs Reviewed  BASIC METABOLIC PANEL - Abnormal; Notable for the following:    Sodium 134 (*)    CO2 21 (*)    Glucose, Bld 146 (*)    BUN 60 (*)    Creatinine, Ser 2.89 (*)    GFR calc non Af Amer 18 (*)    GFR calc Af Amer 21 (*)    All other components within normal limits  CBC WITH DIFFERENTIAL/PLATELET - Abnormal; Notable for the following:    WBC 21.6 (*)    HCT 39.2 (*)    Neutro Abs 19.3 (*)    Lymphs Abs 0.7 (*)    Monocytes Absolute 1.4 (*)    Basophils Absolute 0.2 (*)    All other components within normal limits  URINALYSIS COMPLETEWITH MICROSCOPIC ([facility] ONLY) - Abnormal; Notable for the following:    Color, Urine YELLOW (*)    APPearance CLEAR (*)    Glucose, UA 50 (*)    Protein, ur 30 (*)    Squamous Epithelial / LPF 0-5 (*)    All other components within normal limits   ____________________________________________   EKG  Interpreted by me Sinus tachycardia rate 101, left axis, normal intervals. Normal QRS ST segments and T waves, except for Q waves in inferior leads  ____________________________________________    RADIOLOGY  CT had discussed with  radiologist, shows a right frontal subarachnoid hemorrhage which is small. CT abdomen and pelvis essentially unremarkable ____________________________________________   PROCEDURES CRITICAL CARE Performed by: Joni Fears, Bettymae Yott   Total critical care time: 35 minutes  Critical care time was exclusive of separately billable procedures and treating other patients.  Critical care was necessary to treat or prevent imminent or life-threatening deterioration.  Critical care was time spent personally by me on the following activities: development of treatment plan with patient and/or surrogate as well as nursing, discussions with consultants, evaluation of patient's response to treatment, examination of patient, obtaining history from patient or surrogate, ordering and performing treatments and interventions, ordering and review of laboratory studies, ordering and review of radiographic studies, pulse oximetry and re-evaluation of patient's condition.   ____________________________________________   INITIAL IMPRESSION / ASSESSMENT AND PLAN / ED COURSE  Pertinent labs & imaging results that were available during my care of the patient were reviewed by me and considered in my medical decision making (see chart for details).  Patient presents without when his fall. He is on Plavix. His may be due to acute urinary retention as he does have a history of BPH with retention. We'll check a CT head as well as labs and urinalysis. He feels like he can void now so we will give that a try and then do a bladder  scan immediately afterward. ----------------------------------------- 11:50 PM on 05/17/2015 -----------------------------------------  Finding of subarachnoid hemorrhage. Case discussed with [facility] Dr. Trenton Gammon. He advises that these injuries are minimal and highly unlikely to progress to any clinically significant impact. He recommends a repeat CT scan in 1-2 days and outpatient  follow-up with primary care. Further advises that this does not require hospitalization at this time given the normal vital signs mental status and otherwise reassuring exam. I have ordered the patient for a repeat CT scan on the morning of October 25 instructed the patient to follow up for the scan and then with primary care.  Bladder scan shows about 500 mL's of urine even after the patient is voided. We'll place a Foley catheter and have him follow up with primary care again in 2-3 days with a can reassess repeat CT scan as well as the catheter. Patient is stable.       ____________________________________________   FINAL CLINICAL IMPRESSION(S) / ED DIAGNOSES  Final diagnoses:  Subarachnoid hematoma, without loss of consciousness, initial encounter [facility])   acute urinary retention    Carrie Mew, MD 05/18/15 0023

## 2015-05-17 NOTE — ED Notes (Signed)
Pt arrives to ED via [facility] with c/o 2 unwitnessed falls at home. Per EMS, pt has one fall around 7pm where he landing on his back on a carpeted surface. Pt had a second fall in the kitchen where he landed on a tile floor. EMS reports pt has a h/x of HTN, CVA, Parkinson's, and only has one kidney. Pt denies any dizziness or lightheadedness prior to his falls, he reports that his legs "just gave out". Pt denies any pain; no injury, trauma, or deformities noted. Pt is A&O, in NAD, with respirations regular, even, and unlabored. Pt has family at bedside.

## 2015-05-17 NOTE — ED Notes (Signed)
Patient transported to CT 

## 2015-05-18 ENCOUNTER — Encounter: Payer: Self-pay | Admitting: Internal Medicine

## 2015-05-18 ENCOUNTER — Ambulatory Visit (INDEPENDENT_AMBULATORY_CARE_PROVIDER_SITE_OTHER): Payer: Medicare Other | Admitting: Internal Medicine

## 2015-05-18 VITALS — BP 110/60 | HR 88 | Temp 97.7°F | Resp 16 | Ht 67.0 in | Wt 167.8 lb

## 2015-05-18 DIAGNOSIS — D72829 Elevated white blood cell count, unspecified: Secondary | ICD-10-CM

## 2015-05-18 DIAGNOSIS — E860 Dehydration: Secondary | ICD-10-CM | POA: Diagnosis not present

## 2015-05-18 DIAGNOSIS — I2510 Atherosclerotic heart disease of native coronary artery without angina pectoris: Secondary | ICD-10-CM | POA: Diagnosis not present

## 2015-05-18 DIAGNOSIS — Z79899 Other long term (current) drug therapy: Secondary | ICD-10-CM | POA: Diagnosis not present

## 2015-05-18 DIAGNOSIS — S066X0A Traumatic subarachnoid hemorrhage without loss of consciousness, initial encounter: Secondary | ICD-10-CM | POA: Diagnosis not present

## 2015-05-18 DIAGNOSIS — S0093XS Contusion of unspecified part of head, sequela: Secondary | ICD-10-CM

## 2015-05-18 LAB — CBC WITH DIFFERENTIAL/PLATELET
BASOS ABS: 0 10*3/uL (ref 0.0–0.1)
Basophils Relative: 0 % (ref 0–1)
EOS ABS: 0.3 10*3/uL (ref 0.0–0.7)
EOS PCT: 2 % (ref 0–5)
HEMATOCRIT: 36.8 % — AB (ref 39.0–52.0)
Hemoglobin: 12.2 g/dL — ABNORMAL LOW (ref 13.0–17.0)
LYMPHS PCT: 9 % — AB (ref 12–46)
Lymphs Abs: 1.2 10*3/uL (ref 0.7–4.0)
MCH: 29.5 pg (ref 26.0–34.0)
MCHC: 33.2 g/dL (ref 30.0–36.0)
MCV: 89.1 fL (ref 78.0–100.0)
MONO ABS: 1.2 10*3/uL — AB (ref 0.1–1.0)
MONOS PCT: 9 % (ref 3–12)
MPV: 9.7 fL (ref 8.6–12.4)
Neutro Abs: 10.2 10*3/uL — ABNORMAL HIGH (ref 1.7–7.7)
Neutrophils Relative %: 80 % — ABNORMAL HIGH (ref 43–77)
PLATELETS: 225 10*3/uL (ref 150–400)
RBC: 4.13 MIL/uL — ABNORMAL LOW (ref 4.22–5.81)
RDW: 14.3 % (ref 11.5–15.5)
WBC: 12.8 10*3/uL — ABNORMAL HIGH (ref 4.0–10.5)

## 2015-05-18 LAB — CK: CK TOTAL: 314 U/L — AB (ref 7–232)

## 2015-05-18 LAB — BASIC METABOLIC PANEL WITH GFR
BUN: 49 mg/dL — AB (ref 7–25)
CO2: 22 mmol/L (ref 20–31)
CREATININE: 2.57 mg/dL — AB (ref 0.70–1.11)
Calcium: 8.7 mg/dL (ref 8.6–10.3)
Chloride: 105 mmol/L (ref 98–110)
GFR, EST AFRICAN AMERICAN: 25 mL/min — AB (ref >=60)
GFR, Est Non African American: 22 mL/min — ABNORMAL LOW (ref >=60)
GLUCOSE: 105 mg/dL — AB (ref 65–99)
POTASSIUM: 4.6 mmol/L (ref 3.5–5.3)
Sodium: 137 mmol/L (ref 135–146)

## 2015-05-18 NOTE — ED Notes (Signed)
Dr Joni Fears at bedside to speak with pt regarding plan of care; 465ml per bladder scan; pt uprite on stretcher without c/o voiced at this time

## 2015-05-18 NOTE — ED Notes (Addendum)
Pt voices good understanding of catheter placement; pt's daughter instructed on foley cath cath head injury precautions and voices good understanding as well; daughter declines application of leg bag for foley as she feels it may be too complicated for pt to change out due to his parkinsons--MD aware; pt clensed of scant amount stool; ambulated to exam room doorway with x1 assist; pt without c/o; placed in w/c for d/c home

## 2015-05-18 NOTE — Patient Instructions (Signed)
Stop Norvasc (Amlodipine ) for now   +++++++++++++++++++++++++++++++  Monitor BP's 3 x week or every other day   +++++++++++++++++++++++++++++++  Drink 4 to 5 bottles water daily

## 2015-05-18 NOTE — Discharge Instructions (Signed)
You were seen in the ER today for falls and feeling weak. A CT scan of the head showed a very small amount of blood in the brain around the area of the right forehead. We discussed this with neurosurgery who recommends outpatient follow-up and a repeat CT scan in 1-2 days. We have ordered a repeat CT scan on October 25 at 9:00 AM. Please return to [facility] radiology at that time for this test. Please follow-up with your primary care doctor on the 25th for continued monitoring of your symptoms. Return to the emergency room immediately if your symptoms worsen.  Subarachnoid Hemorrhage Subarachnoid hemorrhage is bleeding in the area between the brain and the membrane that covers the brain (subarachnoid space). This increases the pressure on the brain and causes some areas of the brain to be deprived of blood flow. Subarachnoid hemorrhage is a medical emergency that may cause permanent brain damage, stroke, or even death if not treated.  CAUSES   Head injury.   Ruptured brain aneurysm.   Bleeding from blood vessels that develop abnormally (arteriovenous malformation).   Bleeding disorder.   Use of blood thinners (anticoagulants).  Use of certain drugs, such as cocaine. For some people with subarachnoid hemorrhage, the cause is unknown.  RISK FACTORS  Smoking.  Having high blood pressure (hypertension).  Abusing alcohol.  Being a male, especially being of post-menopausal age.  Having a family history of disease in the blood vessels of the brain (cerebrovascular disease).  Having certain genetic syndromes that result in kidney disease or connective tissue disease. SIGNS AND SYMPTOMS   A sudden, severe headache with no known cause. The headache is often described as the worst headache ever experienced.  Nausea or vomiting, especially when combined with other symptoms such as a headache.  Sudden weakness or numbness of the face, arm, or leg, especially on  one side of the body.  Sudden trouble walking or difficulty moving arms or legs.  Sudden confusion.  Sudden personality changes.  Trouble speaking (aphasia) or understanding.  Difficulty swallowing.  Sudden trouble seeing in one or both eyes.  Double vision.  Dizziness.  Loss of balance or coordination.  Intolerance to light.  Stiff neck. DIAGNOSIS  Your health care provider will perform a physical exam and ask about your symptoms. If a subarachnoid hemorrhage is suspected, various tests may be ordered. These tests may include:   A CT scan.  An MRI.  A cerebral angiogram.  A spinal tap (lumbar puncture).  Blood tests. TREATMENT  Immediate treatment in the hospital is often required to reduce the risk of brain damage. Treatment will depend on the cause of the bleeding, where it is located, and the extent of the bleeding and damage. The goals of treatment include stopping the bleeding, repairing the cause of bleeding, providing relief of symptoms, and preventing problems.   Medicines may be given to:  Lower blood pressure (antihypertensives).  Relieve pain (analgesics).  Relieve nausea or vomiting.  Surgery may also be needed to stop the bleeding, repair the cause of the bleeding, or remove the blood.  Rehabilitation may be needed to improve any cognitive and day-to-day functions impaired by the condition. Further treatment depends on the duration, severity, and cause of your symptoms. Physical, speech, and occupational therapists will assess you and work to improve any functions impaired by the subarachnoid hemorrhage. Measures will be taken to prevent short-term and long-term problems, including infection from breathing foreign material into the lungs (aspiration pneumonia), blood  clots in the legs, bedsores, and falls. HOME CARE INSTRUCTIONS After your hospitalization or inpatient rehabilitation is completed and you are well enough to go home, it is important to  prevent a reoccurrence. Take these steps to help prevent this:  Take medicines only as directed by your health care provider.  If swallow studies have determined that your swallowing reflex is present, you should eat healthy foods. A diet low in salt (sodium), saturated fat, trans fat, and cholesterol may be recommended to manage high blood pressure. Foods may need to be a special consistency (soft or pureed), or small bites may need to be taken in order to avoid aspirating or choking.  Rest and limit activities or movements as directed by your health care provider.  Do not use any tobacco products including cigarettes, chewing tobacco, or electronic cigarettes. If you need help quitting, ask your health care provider.  Limit alcohol intake to no more than 1 drink per day for nonpregnant women and 2 drinks per day for men. One drink equals 12 ounces of beer, 5 ounces of wine, or 1 ounces of hard liquor.  Make any other lifestyle changes as directed by your health care provider.  Monitor and record your blood pressure as directed by your health care provider.  A safe home environment is important to reduce the risk of falls. Your health care provider may arrange for specialists to evaluate your home. Having grab bars in the bedroom and bathroom is often important. Your health care provider may arrange for special equipment to be used at home, such as raised toilets and a seat for the shower.  Physical, occupational, and speech therapy. Ongoing therapy may be needed to maximize your recovery after a subarachnoid bleed. If you have been advised to use a walker or a cane, use it at all times. Be sure to keep your therapy appointments.  Keep all follow-up visits with your health care provider and other specialists. This includes any referrals, physical therapy, and rehabilitation. SEEK IMMEDIATE MEDICAL CARE IF:   You suddenly have a sudden, severe headache with no known cause.  You have nausea  or vomiting occurring with another symptom.  You have sudden weakness or numbness of the face, arm, or leg, especially on one side of the body.  You have sudden trouble walking or difficulty moving arms or legs.  You have sudden confusion.  You have trouble speaking (aphasia) or understanding.  You have sudden trouble seeing in one or both eyes.  You have a sudden loss of balance or coordination.  You have a stiff neck.  You have difficulty breathing.  You have a partial or total loss of consciousness. Any of these symptoms may represent a serious problem that is an emergency. Do not wait to see if the symptoms will go away. Get medical help right away. Call your local emergency services (911 in [place] Do not drive yourself to the hospital.   This information is not intended to replace advice given to you by your health care provider. Make sure you discuss any questions you have with your health care provider.   Document Released: 05/28/2004 Document Revised: 04/01/2015 Document Reviewed: 08/24/2012 Elsevier Interactive Patient Education 2016 [place] Injury, Adult You have received a head injury. It does not appear serious at this time. Headaches and vomiting are common following head injury. It should be easy to awaken from sleeping. Sometimes it is necessary for you to stay in the emergency department for a  while for observation. Sometimes admission to the hospital may be needed. After injuries such as yours, most problems occur within the first 24 hours, but side effects may occur up to 7-10 days after the injury. It is important for you to carefully monitor your condition and contact your health care provider or seek immediate medical care if there is a change in your condition. WHAT ARE THE TYPES OF HEAD INJURIES? Head injuries can be as minor as a bump. Some head injuries can be more severe. More severe head injuries include:  A jarring injury to the brain  (concussion).  A bruise of the brain (contusion). This mean there is bleeding in the brain that can cause swelling.  A cracked skull (skull fracture).  Bleeding in the brain that collects, clots, and forms a bump (hematoma). WHAT CAUSES A HEAD INJURY? A serious head injury is most likely to happen to someone who is in a car wreck and is not wearing a seat belt. Other causes of major head injuries include bicycle or motorcycle accidents, sports injuries, and falls. HOW ARE HEAD INJURIES DIAGNOSED? A complete history of the event leading to the injury and your current symptoms will be helpful in diagnosing head injuries. Many times, pictures of the brain, such as CT or MRI are needed to see the extent of the injury. Often, an overnight hospital stay is necessary for observation.  WHEN SHOULD I SEEK IMMEDIATE MEDICAL CARE?  You should get help right away if:  You have confusion or drowsiness.  You feel sick to your stomach (nauseous) or have continued, forceful vomiting.  You have dizziness or unsteadiness that is getting worse.  You have severe, continued headaches not relieved by medicine. Only take over-the-counter or prescription medicines for pain, fever, or discomfort as directed by your health care provider.  You do not have normal function of the arms or legs or are unable to walk.  You notice changes in the black spots in the center of the colored part of your eye (pupil).  You have a clear or bloody fluid coming from your nose or ears.  You have a loss of vision. During the next 24 hours after the injury, you must stay with someone who can watch you for the warning signs. This person should contact local emergency services (911 in the [place] if you have seizures, you become unconscious, or you are unable to wake up. HOW CAN I PREVENT A HEAD INJURY IN THE FUTURE? The most important factor for preventing major head injuries is avoiding motor vehicle accidents. To minimize the  potential for damage to your head, it is crucial to wear seat belts while riding in motor vehicles. Wearing helmets while bike riding and playing collision sports (like football) is also helpful. Also, avoiding dangerous activities around the house will further help reduce your risk of head injury.  WHEN CAN I RETURN TO NORMAL ACTIVITIES AND ATHLETICS? You should be reevaluated by your health care provider before returning to these activities. If you have any of the following symptoms, you should not return to activities or contact sports until 1 week after the symptoms have stopped:  Persistent headache.  Dizziness or vertigo.  Poor attention and concentration.  Confusion.  Memory problems.  Nausea or vomiting.  Fatigue or tire easily.  Irritability.  Intolerant of bright lights or loud noises.  Anxiety or depression.  Disturbed sleep. MAKE SURE YOU:   Understand these instructions.  Will watch your condition.  Will get help  right away if you are not doing well or get worse.   This information is not intended to replace advice given to you by your health care provider. Make sure you discuss any questions you have with your health care provider.   Document Released: 07/11/2005 Document Revised: 08/01/2014 Document Reviewed: 03/18/2013 Elsevier Interactive Patient Education [employer].

## 2015-05-18 NOTE — Progress Notes (Signed)
Subjective:    Patient ID: Gary Ortega, male    DOB: 07-10-1925, 81 y.o.   MRN: [id number]  HPI   This very nice 81 yo WWM w/Parkinson's Dz is brought in by his daughters today after an eval yesterday at [facility] following an episode of falling w/o LOC , cut apparently fell backwards striking his occiput and neuro exam was intact, but Head CT showed a small Rt frontal lobe subarachnoid hemorrhage and Dr Trenton Gammon was contacted and recommended a f/u CT which is scheduled for tomorrow am. Patient was also felt mildly dehydrated and given IVF in the ER and the released for primary care f/u. Also bladder scan found a 500 cc residual and foley cath was inserted til he can f/u with Dr Diona Fanti. Patient is now advise to use his walker 100%  When up & ambulating.      U/A was neg/Nl in the ER, CXR was negative , but WBC was elevated at 23,000.    Medication Sig  . amLODipine (NORVASC) 5 MG tablet TAKE ONE TABLET BY MOUTH AT BEDTIME FOR BLOOD PRESSURE  . carbidopa-levodopa (SINEMET IR) 25-250 MG tablet Take 1 tablet by mouth 3 (three) times daily.  . Cholecalciferol (VITAMIN D3) 5000 UNITS CAPS Take by mouth daily.    . clopidogrel (PLAVIX) 75 MG tablet TAKE 1 TABLET DAILY TO     PREVENT STROKE  . Fish Oil OIL Take by mouth daily. 1 tablespoon   . ketoconazole (NIZORAL) 2 % shampoo   . LOCOID 0.1 % LOTN   . losartan (COZAAR) 100 MG tablet Take 1/2 to 1 tablet daily for BP as directed  . metoprolol succinate (TOPROL-XL) 25 MG 24 hr tablet 1/2-1 pill at night for blood pressure  . multivitamin (THERAGRAN) per tablet Take 1 tablet by mouth daily.    . nitroGLYCERIN (NITROSTAT) 0.4 MG SL tablet Place 1 tablet (0.4 mg total) under the tongue every 5 (five) minutes as needed for chest pain. One tablet under tongue at onset of chest pain; may repeat every 5 minutes for up to 3 doses  . vitamin C (ASCORBIC ACID) 500 MG tablet Take 500 mg by mouth daily.   Allergies  Allergen Reactions  . Ace  Inhibitors   . Lisinopril     Affects kidney  . Nsaids   . Wellbutrin [Bupropion]     confusion   Past Medical History  Diagnosis Date  . BPH (benign prostatic hypertrophy) with urinary obstruction   . CVA (cerebral infarction)   . Hypertension   . Parkinson disease ([place])   . Renal disorder   . Stroke ([place])   . Cancer [facility])    Past Surgical History  Procedure Laterality Date  . Colonoscopy    . Cystoscopy      prostate radioactive I-125 seed impklantation   . Mandible fracture surgery  1948  . Appendectomy    . Hemorrhoid surgery    . Cataract extraction    . Total nephrectomy Right 2012   Review of Systems  10 point systems review negative except as above.    Objective:   Physical Exam  BP 110/60 mmHg  Pulse 88  Temp(Src) 97.7 F (36.5 C)  Resp 16  Ht 5\' 7"  (1.702 m)  Wt 167 lb 12.8 oz (76.114 kg)  BMI 26.28 kg/m2   Skin - clear HEENT - Masked facies. Eac's patent. TM's Nl. EOM's full. PERRLA. NasoOroPharynx clear. Neck - supple. Nl Thyroid. Carotids 2+ & No  bruits, nodes, JVD Chest - Clear equal BS w/o Rales, rhonchi, wheezes. Cor - Nl HS. RRR w/o sig MGR. PP 1(+). No edema. Abd - No palpable organomegaly, masses or tenderness. BS nl. MS- FROM w/o deformities. Bradykinesia. Mild cogwheeling tone. Gait sl broad-based & shuffling with a walker. Neuro - No obvious Cr N abnormalities. Sensory, motor and Cerebellar functions appear Nl w/o focal abnormalities. No tremor.  Psyche - Mental status - flat depressed affect.  No delusions, ideations or obvious mood abnormalities.    Assessment & Plan:   1. Head contusion, sequela   2. Dehydration  - BASIC METABOLIC PANEL WITH GFR  - increase fluid intake, stop amlodipine and monitor BP's & call results  3. Leukocytosis  - CBC with Differential/Platelet  4. Medication management  - CBC with Differential/Platelet - BASIC METABOLIC PANEL WITH GFR - CK   5. Urinary Retention ? Obstructive vs Neurogenic  -  Maintain foley cath til f/u w/ Dr Diona Fanti  Discussed med, SE's with Pt & daughters. Over 30 minutes of exam, counseling, chart review and high complex critical decision making was performed

## 2015-05-19 ENCOUNTER — Ambulatory Visit
Admission: RE | Admit: 2015-05-19 | Discharge: 2015-05-19 | Disposition: A | Discharge status: Home / Self Care | Payer: Medicare Other | Source: Ambulatory Visit | Attending: Internal Medicine | Admitting: Internal Medicine

## 2015-05-19 ENCOUNTER — Other Ambulatory Visit: Payer: Self-pay | Admitting: Internal Medicine

## 2015-05-19 DIAGNOSIS — S066X0D Traumatic subarachnoid hemorrhage without loss of consciousness, subsequent encounter: Secondary | ICD-10-CM | POA: Diagnosis not present

## 2015-05-19 DIAGNOSIS — Z09 Encounter for follow-up examination after completed treatment for conditions other than malignant neoplasm: Secondary | ICD-10-CM | POA: Insufficient documentation

## 2015-05-19 DIAGNOSIS — S065X9A Traumatic subdural hemorrhage with loss of consciousness of unspecified duration, initial encounter: Secondary | ICD-10-CM

## 2015-05-19 DIAGNOSIS — S065XAA Traumatic subdural hemorrhage with loss of consciousness status unknown, initial encounter: Secondary | ICD-10-CM

## 2015-05-19 DIAGNOSIS — I609 Nontraumatic subarachnoid hemorrhage, unspecified: Secondary | ICD-10-CM | POA: Diagnosis not present

## 2015-05-20 DIAGNOSIS — R338 Other retention of urine: Secondary | ICD-10-CM | POA: Diagnosis not present

## 2015-05-22 DIAGNOSIS — R338 Other retention of urine: Secondary | ICD-10-CM | POA: Diagnosis not present

## 2015-05-26 ENCOUNTER — Ambulatory Visit (INDEPENDENT_AMBULATORY_CARE_PROVIDER_SITE_OTHER): Payer: Medicare Other | Admitting: Internal Medicine

## 2015-05-26 ENCOUNTER — Encounter: Payer: Self-pay | Admitting: Internal Medicine

## 2015-05-26 VITALS — BP 126/60 | HR 64 | Temp 98.0°F | Resp 16 | Ht 67.0 in | Wt 167.0 lb

## 2015-05-26 DIAGNOSIS — R899 Unspecified abnormal finding in specimens from other organs, systems and tissues: Secondary | ICD-10-CM | POA: Diagnosis not present

## 2015-05-26 DIAGNOSIS — M791 Myalgia: Secondary | ICD-10-CM | POA: Diagnosis not present

## 2015-05-26 DIAGNOSIS — Z79899 Other long term (current) drug therapy: Secondary | ICD-10-CM | POA: Diagnosis not present

## 2015-05-26 DIAGNOSIS — M609 Myositis, unspecified: Secondary | ICD-10-CM | POA: Diagnosis not present

## 2015-05-26 DIAGNOSIS — IMO0001 Reserved for inherently not codable concepts without codable children: Secondary | ICD-10-CM

## 2015-05-26 LAB — BASIC METABOLIC PANEL WITHOUT GFR
BUN: 44 mg/dL — ABNORMAL HIGH (ref 7–25)
CO2: 26 mmol/L (ref 20–31)
Calcium: 9.6 mg/dL (ref 8.6–10.3)
Chloride: 101 mmol/L (ref 98–110)
Creat: 2.31 mg/dL — ABNORMAL HIGH (ref 0.70–1.11)
GFR, Est African American: 28 mL/min — ABNORMAL LOW
GFR, Est Non African American: 24 mL/min — ABNORMAL LOW
Glucose, Bld: 100 mg/dL — ABNORMAL HIGH (ref 65–99)
Potassium: 5 mmol/L (ref 3.5–5.3)
Sodium: 137 mmol/L (ref 135–146)

## 2015-05-26 LAB — CBC WITH DIFFERENTIAL/PLATELET
BASOS ABS: 0.1 10*3/uL (ref 0.0–0.1)
Basophils Relative: 1 % (ref 0–1)
EOS ABS: 0.2 10*3/uL (ref 0.0–0.7)
EOS PCT: 3 % (ref 0–5)
HCT: 39.4 % (ref 39.0–52.0)
Hemoglobin: 13.1 g/dL (ref 13.0–17.0)
LYMPHS ABS: 1.5 10*3/uL (ref 0.7–4.0)
Lymphocytes Relative: 20 % (ref 12–46)
MCH: 29.4 pg (ref 26.0–34.0)
MCHC: 33.2 g/dL (ref 30.0–36.0)
MCV: 88.5 fL (ref 78.0–100.0)
MPV: 9.2 fL (ref 8.6–12.4)
Monocytes Absolute: 0.5 10*3/uL (ref 0.1–1.0)
Monocytes Relative: 6 % (ref 3–12)
Neutro Abs: 5.3 10*3/uL (ref 1.7–7.7)
Neutrophils Relative %: 70 % (ref 43–77)
PLATELETS: 290 10*3/uL (ref 150–400)
RBC: 4.45 MIL/uL (ref 4.22–5.81)
RDW: 13.8 % (ref 11.5–15.5)
WBC: 7.6 10*3/uL (ref 4.0–10.5)

## 2015-05-26 LAB — CK: Total CK: 69 U/L (ref 7–232)

## 2015-05-26 NOTE — Addendum Note (Signed)
Addended by: Starlyn Skeans A on: 05/26/2015 02:00 PM   Modules accepted: Level of Service

## 2015-05-26 NOTE — Progress Notes (Signed)
Subjective:    Patient ID: Gary Ortega, male    DOB: 07/17/1925, 81 y.o.   MRN: [id number]  Leg Pain   Muscle Pain Pertinent negatives include no abdominal pain, constipation, diarrhea, dysuria, fatigue, fever, joint swelling, nausea, shortness of breath, vomiting, weakness or wheezing.  Patient presents to the office for evaluation of right leg pain and weakness after he fell approximately a week ago.  Patient reports that for the past week he has been having a hard time with right leg muscle pain and some soreness.  He reports the pain is intermittent and mostly after sitting and trying to get up.  After moving around for a while it seems to get better.  He hasn't done anything as far as treatment for the leg is concerned.        Review of Systems  Constitutional: Negative for fever, chills and fatigue.  Respiratory: Negative for choking, shortness of breath and wheezing.   Gastrointestinal: Negative for nausea, vomiting, abdominal pain, diarrhea and constipation.  Genitourinary: Negative for dysuria, urgency, frequency, hematuria and difficulty urinating.  Musculoskeletal: Positive for myalgias. Negative for joint swelling, arthralgias and gait problem.  Neurological: Negative for dizziness, speech difficulty, weakness and light-headedness.       Objective:   Physical Exam  Constitutional: He is oriented to person, place, and time. He appears well-developed and well-nourished. No distress.  HENT:  Head: Normocephalic.  Mouth/Throat: Oropharynx is clear and moist. No oropharyngeal exudate.  Eyes: Conjunctivae are normal. No scleral icterus.  Neck: Normal range of motion. Neck supple. No JVD present. No thyromegaly present.  Cardiovascular: Normal rate, regular rhythm, normal heart sounds and intact distal pulses.  Exam reveals no gallop.   No murmur heard. Pulmonary/Chest: Effort normal and breath sounds normal. No respiratory distress. He has no wheezes. He has no  rales. He exhibits no tenderness.  Abdominal: Soft. Bowel sounds are normal. He exhibits no distension and no mass. There is no tenderness. There is no rebound and no guarding.  Musculoskeletal:       Right hip: Normal.       Right knee: He exhibits decreased range of motion. He exhibits no swelling, no effusion, no ecchymosis, no deformity, no laceration, no erythema, normal alignment, no LCL laxity, normal patellar mobility, no bony tenderness, normal meniscus and no MCL laxity. No tenderness found.       Right upper leg: He exhibits no tenderness, no bony tenderness, no swelling, no edema, no deformity and no laceration.  Cogwheel rigidity noted with complete inability to extend leg fully.  No tenderness to palpation.  No irritability with hip internal or external rotation.  Palpable muscle spasm in right VMO.   Lymphadenopathy:    He has no cervical adenopathy.  Neurological: He is alert and oriented to person, place, and time.  Skin: Skin is warm and dry. He is not diaphoretic.  Psychiatric: He has a normal mood and affect. His behavior is normal. Judgment and thought content normal.  Nursing note and vitals reviewed.   Filed Vitals:   05/26/15 1125  BP: 126/60  Pulse: 64  Temp: 98 F (36.7 C)  Resp: 16          Assessment & Plan:    1. Myalgia and myositis -tylenol -drink plenty of water -use walker -heating pad -no muscle relaxant given age - CK, total  2. Medication management  - BASIC METABOLIC PANEL WITH GFR  3. Abnormal laboratory test result  - CBC with  Differential/Platelet - BASIC METABOLIC PANEL WITH GFR

## 2015-05-26 NOTE — Addendum Note (Signed)
Addended by: Garry Nicolini A on: 05/26/2015 11:33 AM   Modules accepted: Orders, Medications

## 2015-07-07 ENCOUNTER — Ambulatory Visit (HOSPITAL_COMMUNITY)
Admission: RE | Admit: 2015-07-07 | Discharge: 2015-07-07 | Disposition: A | Discharge status: Home / Self Care | Payer: Medicare Other | Source: Ambulatory Visit | Attending: Internal Medicine | Admitting: Internal Medicine

## 2015-07-07 ENCOUNTER — Ambulatory Visit (INDEPENDENT_AMBULATORY_CARE_PROVIDER_SITE_OTHER): Payer: Medicare Other | Admitting: Internal Medicine

## 2015-07-07 ENCOUNTER — Other Ambulatory Visit: Payer: Self-pay | Admitting: Internal Medicine

## 2015-07-07 ENCOUNTER — Encounter: Payer: Self-pay | Admitting: *Deleted

## 2015-07-07 VITALS — BP 128/64 | HR 71 | Temp 97.5°F | Resp 16 | Wt 167.2 lb

## 2015-07-07 DIAGNOSIS — M25562 Pain in left knee: Secondary | ICD-10-CM | POA: Diagnosis not present

## 2015-07-07 DIAGNOSIS — M25561 Pain in right knee: Secondary | ICD-10-CM | POA: Diagnosis not present

## 2015-07-07 DIAGNOSIS — N184 Chronic kidney disease, stage 4 (severe): Secondary | ICD-10-CM

## 2015-07-07 DIAGNOSIS — E559 Vitamin D deficiency, unspecified: Secondary | ICD-10-CM | POA: Diagnosis not present

## 2015-07-07 DIAGNOSIS — E782 Mixed hyperlipidemia: Secondary | ICD-10-CM

## 2015-07-07 DIAGNOSIS — E1122 Type 2 diabetes mellitus with diabetic chronic kidney disease: Secondary | ICD-10-CM

## 2015-07-07 DIAGNOSIS — M1712 Unilateral primary osteoarthritis, left knee: Secondary | ICD-10-CM | POA: Diagnosis not present

## 2015-07-07 DIAGNOSIS — Z79899 Other long term (current) drug therapy: Secondary | ICD-10-CM | POA: Diagnosis not present

## 2015-07-07 DIAGNOSIS — I10 Essential (primary) hypertension: Secondary | ICD-10-CM

## 2015-07-07 DIAGNOSIS — E1129 Type 2 diabetes mellitus with other diabetic kidney complication: Secondary | ICD-10-CM | POA: Diagnosis not present

## 2015-07-07 DIAGNOSIS — M1711 Unilateral primary osteoarthritis, right knee: Secondary | ICD-10-CM | POA: Diagnosis not present

## 2015-07-07 LAB — TSH: TSH: 1.389 u[IU]/mL (ref 0.350–4.500)

## 2015-07-07 LAB — BASIC METABOLIC PANEL WITH GFR
BUN: 47 mg/dL — ABNORMAL HIGH (ref 7–25)
CALCIUM: 9.5 mg/dL (ref 8.6–10.3)
CO2: 26 mmol/L (ref 20–31)
CREATININE: 2.63 mg/dL — AB (ref 0.70–1.11)
Chloride: 103 mmol/L (ref 98–110)
GFR, EST AFRICAN AMERICAN: 24 mL/min — AB (ref >=60)
GFR, EST NON AFRICAN AMERICAN: 21 mL/min — AB (ref >=60)
Glucose, Bld: 82 mg/dL (ref 65–99)
Potassium: 5.1 mmol/L (ref 3.5–5.3)
SODIUM: 137 mmol/L (ref 135–146)

## 2015-07-07 LAB — HEPATIC FUNCTION PANEL
ALT: 6 U/L — ABNORMAL LOW (ref 9–46)
AST: 19 U/L (ref 10–35)
Albumin: 4.1 g/dL (ref 3.6–5.1)
Alkaline Phosphatase: 42 U/L (ref 40–115)
BILIRUBIN DIRECT: 0.1 mg/dL (ref <=0.2)
BILIRUBIN INDIRECT: 0.4 mg/dL (ref 0.2–1.2)
TOTAL PROTEIN: 6.6 g/dL (ref 6.1–8.1)
Total Bilirubin: 0.5 mg/dL (ref 0.2–1.2)

## 2015-07-07 LAB — CBC WITH DIFFERENTIAL/PLATELET
BASOS PCT: 1 % (ref 0–1)
Basophils Absolute: 0.1 10*3/uL (ref 0.0–0.1)
EOS ABS: 0.3 10*3/uL (ref 0.0–0.7)
EOS PCT: 4 % (ref 0–5)
HCT: 39.2 % (ref 39.0–52.0)
Hemoglobin: 13.1 g/dL (ref 13.0–17.0)
Lymphocytes Relative: 16 % (ref 12–46)
Lymphs Abs: 1.2 10*3/uL (ref 0.7–4.0)
MCH: 30.1 pg (ref 26.0–34.0)
MCHC: 33.4 g/dL (ref 30.0–36.0)
MCV: 90.1 fL (ref 78.0–100.0)
MONO ABS: 0.8 10*3/uL (ref 0.1–1.0)
MPV: 9.4 fL (ref 8.6–12.4)
Monocytes Relative: 10 % (ref 3–12)
Neutro Abs: 5.4 10*3/uL (ref 1.7–7.7)
Neutrophils Relative %: 69 % (ref 43–77)
Platelets: 250 10*3/uL (ref 150–400)
RBC: 4.35 MIL/uL (ref 4.22–5.81)
RDW: 13.9 % (ref 11.5–15.5)
WBC: 7.8 10*3/uL (ref 4.0–10.5)

## 2015-07-07 LAB — LIPID PANEL
CHOL/HDL RATIO: 6.7 ratio — AB (ref <=5.0)
CHOLESTEROL: 193 mg/dL (ref 125–200)
HDL: 29 mg/dL — AB (ref >=40)
LDL Cholesterol: 107 mg/dL (ref <130)
Triglycerides: 285 mg/dL — ABNORMAL HIGH (ref <150)
VLDL: 57 mg/dL — ABNORMAL HIGH (ref <30)

## 2015-07-07 MED ORDER — SILODOSIN 4 MG PO CAPS: 4.0000 mg | ORAL_CAPSULE | Freq: Every day | ORAL | Status: DC

## 2015-07-07 NOTE — Patient Instructions (Addendum)
Please start taking the rapaflo 4 mg tablet daily instead of the 8 mg tablet to see if this will decrease dizziness.    Stretching exercises sitting down before betime time. Goal is to touch toes.  Please take tylenol as needed for your leg pain.  You can always take 2 tablets 3 times a day.    [facility]  Go to the radiology department and give them your name.

## 2015-07-07 NOTE — Telephone Encounter (Signed)
This encounter was created in error - please disregard.

## 2015-07-07 NOTE — Progress Notes (Signed)
Patient ID: Gary Ortega, male   DOB: 1924/09/19, 81 y.o.   MRN: [id number]  Assessment and Plan:  Hypertension:  -Continue medication,  -monitor blood pressure at home.  -Continue DASH diet.   -Reminder to go to the ER if any CP, SOB, nausea, dizziness, severe HA, changes vision/speech, left arm numbness and tingling, and jaw pain.  Cholesterol: -Continue diet and exercise.  -Check cholesterol.   Pre-diabetes: -Continue diet and exercise.  -Check A1C  Vitamin D Def: -check level -continue medications.   BPH -decrease rapaflo to 4 mg due to dizziness  Bilateral leg pain -likely arthritis from knees -cont extra strength tylenol -knee xrays  Continue diet and meds as discussed. Further disposition pending results of labs.  HPI 81 y.o. male  presents for 3 month follow up with hypertension, hyperlipidemia, prediabetes and vitamin D.   His blood pressure has been controlled at home, today their BP is BP: 128/64 mmHg.   He does not workout. He denies chest pain, shortness of breath, dizziness.   He is not on cholesterol medication and denies myalgias. His cholesterol is not at goal. The cholesterol last visit was:   Lab Results  Component Value Date   CHOL 245* 04/02/2015   HDL 31* 04/02/2015   LDLCALC NOT CALC 04/02/2015   TRIG 490* 04/02/2015   CHOLHDL 7.9* 04/02/2015     He has been working on diet and exercise for prediabetes, and denies foot ulcerations, hyperglycemia, hypoglycemia , increased appetite, nausea, paresthesia of the feet, polydipsia, polyuria, visual disturbances, vomiting and weight loss. Last A1C in the office was:  Lab Results  Component Value Date   HGBA1C 6.1* 04/02/2015    Patient is on Vitamin D supplement.  Lab Results  Component Value Date   VD25OH 26 04/02/2015     Patient reports that he has been having bilateral thigh pain and knee pain which has been bothering him especially when he sits for long periods of time.  It goes away  with extra strength tylenol.    He reports that his memory is doing okay.  His daughter reports maybe small decline in the memory.  He has been staying home more.    Current Medications:  Current Outpatient Prescriptions on File Prior to Visit  Medication Sig Dispense Refill  . carbidopa-levodopa (SINEMET IR) 25-250 MG tablet Take 1 tablet by mouth 3 (three) times daily. 270 tablet 1  . Cholecalciferol (VITAMIN D3) 5000 UNITS CAPS Take by mouth daily.      . clopidogrel (PLAVIX) 75 MG tablet TAKE 1 TABLET DAILY TO     PREVENT STROKE 90 tablet 3  . Fish Oil OIL Take by mouth daily. 1 tablespoon     . ketoconazole (NIZORAL) 2 % shampoo     . LOCOID 0.1 % LOTN     . losartan (COZAAR) 100 MG tablet Take 1/2 to 1 tablet daily for BP as directed 30 tablet 99  . metoprolol succinate (TOPROL-XL) 25 MG 24 hr tablet 1/2-1 pill at night for blood pressure 90 tablet 1  . multivitamin (THERAGRAN) per tablet Take 1 tablet by mouth daily.      . nitroGLYCERIN (NITROSTAT) 0.4 MG SL tablet Place 1 tablet (0.4 mg total) under the tongue every 5 (five) minutes as needed for chest pain. One tablet under tongue at onset of chest pain; may repeat every 5 minutes for up to 3 doses 30 tablet PRN  . silodosin (RAPAFLO) 8 MG CAPS capsule Take 8 mg by  mouth daily with breakfast.    . vitamin C (ASCORBIC ACID) 500 MG tablet Take 500 mg by mouth daily.     No current facility-administered medications on file prior to visit.    Medical History:  Past Medical History  Diagnosis Date  . BPH (benign prostatic hypertrophy) with urinary obstruction   . CVA (cerebral infarction)   . Hypertension   . Parkinson disease ([place])   . Renal disorder   . Stroke ([place])   . Cancer ([place])     Allergies:  Allergies  Allergen Reactions  . Ace Inhibitors   . Lisinopril     Affects kidney  . Nsaids   . Wellbutrin [Bupropion]     confusion     Review of Systems:  Review of Systems  Constitutional: Negative for fever,  chills and malaise/fatigue.  HENT: Negative for congestion, ear pain and sore throat.   Eyes: Negative.   Respiratory: Negative for cough, shortness of breath and wheezing.   Cardiovascular: Negative for chest pain, palpitations and leg swelling.  Gastrointestinal: Negative for diarrhea, constipation, blood in stool and melena.  Genitourinary: Negative.   Musculoskeletal: Positive for myalgias and joint pain.  Skin: Negative.   Neurological: Negative for dizziness and headaches.    Family history- Review and unchanged  Social history- Review and unchanged  Physical Exam: BP 128/64 mmHg  Pulse 71  Temp(Src) 97.5 F (36.4 C)  Resp 16  Wt 167 lb 3.2 oz (75.841 kg)  SpO2 99% Wt Readings from Last 3 Encounters:  07/07/15 167 lb 3.2 oz (75.841 kg)  05/26/15 167 lb (75.751 kg)  05/18/15 167 lb 12.8 oz (76.114 kg)    General Appearance: Well nourished well developed, in no apparent distress. Eyes: PERRLA, EOMs, conjunctiva no swelling or erythema ENT/Mouth: Ear canals normal without obstruction, swelling, erythma, discharge.  TMs normal bilaterally.  Oropharynx moist, clear, without exudate, or postoropharyngeal swelling. Neck: Supple, thyroid normal,no cervical adenopathy  Respiratory: Respiratory effort normal, Breath sounds clear A&P without rhonchi, wheeze, or rale.  No retractions, no accessory usage. Cardio: RRR with no MRGs. Brisk peripheral pulses without edema.  Abdomen: Soft, + BS,  Non tender, no guarding, rebound, hernias, masses. Musculoskeletal: Full ROM, 5/5 strength, shuffling gait with cane, cogwheel rigidity Skin: Warm, dry without rashes, lesions, ecchymosis.  Neuro: Awake and oriented X 3, Cranial nerves intact. Tremor bilateral hands Psych: Normal affect, Insight and Judgment appropriate.    Starlyn Skeans, PA-C 11:25 AM [facility]

## 2015-07-08 ENCOUNTER — Encounter: Payer: Self-pay | Admitting: Internal Medicine

## 2015-07-08 LAB — HEMOGLOBIN A1C
HEMOGLOBIN A1C: 6.1 % — AB (ref <5.7)
MEAN PLASMA GLUCOSE: 128 mg/dL — AB (ref <117)

## 2015-07-08 LAB — VITAMIN D 25 HYDROXY (VIT D DEFICIENCY, FRACTURES): VIT D 25 HYDROXY: 86 ng/mL (ref 30–100)

## 2015-07-28 ENCOUNTER — Ambulatory Visit (INDEPENDENT_AMBULATORY_CARE_PROVIDER_SITE_OTHER): Payer: Medicare Other | Admitting: Urology

## 2015-07-28 DIAGNOSIS — R338 Other retention of urine: Secondary | ICD-10-CM

## 2015-07-28 DIAGNOSIS — C61 Malignant neoplasm of prostate: Secondary | ICD-10-CM | POA: Diagnosis not present

## 2015-07-31 ENCOUNTER — Other Ambulatory Visit: Payer: Self-pay | Admitting: Internal Medicine

## 2015-09-09 ENCOUNTER — Ambulatory Visit (INDEPENDENT_AMBULATORY_CARE_PROVIDER_SITE_OTHER): Payer: Medicare Other | Admitting: Internal Medicine

## 2015-09-09 ENCOUNTER — Encounter: Payer: Self-pay | Admitting: Internal Medicine

## 2015-09-09 VITALS — BP 132/66 | HR 72 | Temp 97.2°F | Resp 16 | Ht 67.0 in | Wt 164.8 lb

## 2015-09-09 DIAGNOSIS — R531 Weakness: Secondary | ICD-10-CM | POA: Diagnosis not present

## 2015-09-09 DIAGNOSIS — G20 Parkinson's disease: Secondary | ICD-10-CM

## 2015-09-09 DIAGNOSIS — Z79899 Other long term (current) drug therapy: Secondary | ICD-10-CM | POA: Diagnosis not present

## 2015-09-09 DIAGNOSIS — N390 Urinary tract infection, site not specified: Secondary | ICD-10-CM

## 2015-09-09 LAB — CBC WITH DIFFERENTIAL/PLATELET
Basophils Absolute: 0.1 10*3/uL (ref 0.0–0.1)
Basophils Relative: 1 % (ref 0–1)
EOS ABS: 0.3 10*3/uL (ref 0.0–0.7)
EOS PCT: 4 % (ref 0–5)
HCT: 41.3 % (ref 39.0–52.0)
Hemoglobin: 13.6 g/dL (ref 13.0–17.0)
LYMPHS ABS: 1.4 10*3/uL (ref 0.7–4.0)
Lymphocytes Relative: 17 % (ref 12–46)
MCH: 29.4 pg (ref 26.0–34.0)
MCHC: 32.9 g/dL (ref 30.0–36.0)
MCV: 89.2 fL (ref 78.0–100.0)
MONOS PCT: 7 % (ref 3–12)
MPV: 9.4 fL (ref 8.6–12.4)
Monocytes Absolute: 0.6 10*3/uL (ref 0.1–1.0)
Neutro Abs: 5.9 10*3/uL (ref 1.7–7.7)
Neutrophils Relative %: 71 % (ref 43–77)
PLATELETS: 258 10*3/uL (ref 150–400)
RBC: 4.63 MIL/uL (ref 4.22–5.81)
RDW: 14.3 % (ref 11.5–15.5)
WBC: 8.3 10*3/uL (ref 4.0–10.5)

## 2015-09-09 LAB — URINALYSIS, ROUTINE W REFLEX MICROSCOPIC
Bilirubin Urine: NEGATIVE
GLUCOSE, UA: NEGATIVE
HGB URINE DIPSTICK: NEGATIVE
KETONES UR: NEGATIVE
Leukocytes, UA: NEGATIVE
NITRITE: NEGATIVE
PH: 6 (ref 5.0–8.0)
Specific Gravity, Urine: 1.019 (ref 1.001–1.035)

## 2015-09-09 LAB — URINALYSIS, MICROSCOPIC ONLY
BACTERIA UA: NONE SEEN [HPF]
CASTS: NONE SEEN [LPF]
Crystals: NONE SEEN [HPF]
RBC / HPF: NONE SEEN RBC/HPF (ref <=2)
Squamous Epithelial / LPF: NONE SEEN [HPF] (ref <=5)
WBC, UA: NONE SEEN WBC/HPF (ref <=5)
YEAST: NONE SEEN [HPF]

## 2015-09-09 NOTE — Progress Notes (Signed)
Subjective:    Patient ID: Gary Ortega, male    DOB: 04-02-1925, 82 y.o.   MRN: [id number]  HPI  This very nice 82 yo WWM with HTN, ASCVD/TIA's, T2DM, & Parkinson's Dz is brought in by his daughter for evaluation of a continual gradual downhill course of weakness. Patient has stopped driving and has stopped his short walks to the "doglot" in his back yard as he frequently has to stop & rest. Denies chest pains and no other cardiac sx's as palpitations, PND, orthopnea or edema. Daughter reports po intake is poor and "he doesn't drink enough water". Denies respiratory congestion, GI or GU sx's.   Medication Sig  . carbidopa-levodopa (SINEMET IR) 25-250 MG tablet Take 1 tablet by mouth 3 (three) times daily.  Marland Kitchen VITAMIN D 5000 UNITS Take by mouth daily.    . clopidogrel  75 MG  TAKE 1 TABLET DAILY TO     PREVENT STROKE  . Fish Oil Take by mouth daily. 1 tablespoon   . NIZORAL 2 % shampoo   . LOCOID 0.1 % LOTN   . losartan  100 MG tablet Take 1/2 to 1 tablet daily for BP as directed  . metoprolol succinate -XL 25 MG  1/2-1 pill at night for blood pressure  . THERAGRAN Take 1 tablet by mouth daily.    . vitamin C  500 MG tablet Take 500 mg by mouth daily.   Allergies  Allergen Reactions  . Ace Inhibitors   . Lisinopril     Affects kidney  . Nsaids   . Wellbutrin [Bupropion]     confusion    Past Medical History  Diagnosis Date  . BPH (benign prostatic hypertrophy) with urinary obstruction   . CVA (cerebral infarction)   . Hypertension   . Parkinson disease ([place])   . Renal disorder   . Stroke ([place])   . Cancer [facility])    Past Surgical History  Procedure Laterality Date  . Colonoscopy    . Cystoscopy      prostate radioactive I-125 seed impklantation   . Mandible fracture surgery  1948  . Appendectomy    . Hemorrhoid surgery    . Cataract extraction    . Total nephrectomy Right 2012   Review of Systems  10 point systems review negative except as above.    Objective:   Physical Exam  BP 132/66 mmHg  Pulse 72  Temp(Src) 97.2 F (36.2 C)  Resp 16  Ht 5\' 7"  (1.702 m)  Wt 164 lb 12.8 oz (74.753 kg)  BMI 25.81 kg/m2   Masked facies. Flat/Depressed affect. In No obvious distress.  HEENT - Eac's patent. TM's Nl. EOM's full. PERRLA. NasoOroPharynx clear. Neck - supple. Nl Thyroid. Carotids 2+ & No bruits, nodes, JVD Chest - Clear equal BS w/o Rales, rhonchi, wheezes. Cor - Nl HS. RRR w/o sig MGR. PP 1(+). No edema. Abd - Soft. Benign.  MS- FROM w/o deformities. Mild cogwheeling. (+) pill rolling tremor. Shuffling gait.  Neuro - No obvious Cr N abnormalities. Sensory, motor and Cerebellar functions appear Nl w/o focal abnormalities. Psyche - Mental status normal & appropriate.  No delusions, ideations or obvious mood abnormalities.    Assessment & Plan:   1. Weakness  - TSH - Cortisol - CK  2. Parkinson's disease ([place])   3. Urinary tract infection, site not specified  - Urinalysis, Routine w reflex microscopic - Urine culture  4. Medication management  - CBC with Differential/Platelet - BASIC METABOLIC PANEL  WITH GFR - Hepatic function panel - Magnesium

## 2015-09-09 NOTE — Patient Instructions (Signed)

## 2015-09-10 LAB — HEPATIC FUNCTION PANEL
ALBUMIN: 4.2 g/dL (ref 3.6–5.1)
ALT: 5 U/L — ABNORMAL LOW (ref 9–46)
AST: 19 U/L (ref 10–35)
Alkaline Phosphatase: 58 U/L (ref 40–115)
BILIRUBIN TOTAL: 0.3 mg/dL (ref 0.2–1.2)
Bilirubin, Direct: 0.1 mg/dL (ref <=0.2)
Indirect Bilirubin: 0.2 mg/dL (ref 0.2–1.2)
Total Protein: 7.2 g/dL (ref 6.1–8.1)

## 2015-09-10 LAB — BASIC METABOLIC PANEL WITH GFR
BUN: 54 mg/dL — AB (ref 7–25)
CO2: 23 mmol/L (ref 20–31)
CREATININE: 3.06 mg/dL — AB (ref 0.70–1.11)
Calcium: 9.6 mg/dL (ref 8.6–10.3)
Chloride: 101 mmol/L (ref 98–110)
GFR, EST AFRICAN AMERICAN: 20 mL/min — AB (ref >=60)
GFR, Est Non African American: 17 mL/min — ABNORMAL LOW (ref >=60)
Glucose, Bld: 111 mg/dL — ABNORMAL HIGH (ref 65–99)
Potassium: 4.9 mmol/L (ref 3.5–5.3)
Sodium: 136 mmol/L (ref 135–146)

## 2015-09-10 LAB — CK: CK TOTAL: 73 U/L (ref 7–232)

## 2015-09-10 LAB — TSH: TSH: 1.45 mIU/L (ref 0.40–4.50)

## 2015-09-10 LAB — MAGNESIUM: MAGNESIUM: 2.5 mg/dL (ref 1.5–2.5)

## 2015-09-10 LAB — CORTISOL: CORTISOL PLASMA: 18.8 ug/dL

## 2015-09-11 LAB — URINE CULTURE: Colony Count: 10000

## 2015-09-12 ENCOUNTER — Inpatient Hospital Stay
Admission: EM | Admit: 2015-09-12 | Discharge: 2015-09-15 | DRG: 682 | Disposition: A | Discharge status: Skilled Nursing Facility | Payer: Medicare Other | Attending: Internal Medicine | Admitting: Internal Medicine

## 2015-09-12 ENCOUNTER — Encounter: Payer: Self-pay | Admitting: Emergency Medicine

## 2015-09-12 ENCOUNTER — Emergency Department: Payer: Medicare Other

## 2015-09-12 DIAGNOSIS — I129 Hypertensive chronic kidney disease with stage 1 through stage 4 chronic kidney disease, or unspecified chronic kidney disease: Secondary | ICD-10-CM | POA: Diagnosis present

## 2015-09-12 DIAGNOSIS — R262 Difficulty in walking, not elsewhere classified: Secondary | ICD-10-CM | POA: Diagnosis not present

## 2015-09-12 DIAGNOSIS — Z905 Acquired absence of kidney: Secondary | ICD-10-CM | POA: Diagnosis not present

## 2015-09-12 DIAGNOSIS — I639 Cerebral infarction, unspecified: Secondary | ICD-10-CM

## 2015-09-12 DIAGNOSIS — E785 Hyperlipidemia, unspecified: Secondary | ICD-10-CM | POA: Diagnosis present

## 2015-09-12 DIAGNOSIS — R42 Dizziness and giddiness: Secondary | ICD-10-CM

## 2015-09-12 DIAGNOSIS — R531 Weakness: Secondary | ICD-10-CM

## 2015-09-12 DIAGNOSIS — IMO0002 Reserved for concepts with insufficient information to code with codable children: Secondary | ICD-10-CM

## 2015-09-12 DIAGNOSIS — R748 Abnormal levels of other serum enzymes: Secondary | ICD-10-CM | POA: Diagnosis not present

## 2015-09-12 DIAGNOSIS — R7989 Other specified abnormal findings of blood chemistry: Secondary | ICD-10-CM

## 2015-09-12 DIAGNOSIS — R270 Ataxia, unspecified: Secondary | ICD-10-CM

## 2015-09-12 DIAGNOSIS — N184 Chronic kidney disease, stage 4 (severe): Secondary | ICD-10-CM | POA: Diagnosis present

## 2015-09-12 DIAGNOSIS — R404 Transient alteration of awareness: Secondary | ICD-10-CM | POA: Diagnosis not present

## 2015-09-12 DIAGNOSIS — Q600 Renal agenesis, unilateral: Secondary | ICD-10-CM

## 2015-09-12 DIAGNOSIS — M6281 Muscle weakness (generalized): Secondary | ICD-10-CM | POA: Diagnosis not present

## 2015-09-12 DIAGNOSIS — Z8249 Family history of ischemic heart disease and other diseases of the circulatory system: Secondary | ICD-10-CM

## 2015-09-12 DIAGNOSIS — I69391 Dysphagia following cerebral infarction: Secondary | ICD-10-CM | POA: Diagnosis not present

## 2015-09-12 DIAGNOSIS — I10 Essential (primary) hypertension: Secondary | ICD-10-CM | POA: Diagnosis not present

## 2015-09-12 DIAGNOSIS — Z8546 Personal history of malignant neoplasm of prostate: Secondary | ICD-10-CM

## 2015-09-12 DIAGNOSIS — D72829 Elevated white blood cell count, unspecified: Secondary | ICD-10-CM | POA: Diagnosis present

## 2015-09-12 DIAGNOSIS — Z9889 Other specified postprocedural states: Secondary | ICD-10-CM | POA: Diagnosis not present

## 2015-09-12 DIAGNOSIS — G20 Parkinson's disease: Secondary | ICD-10-CM | POA: Diagnosis present

## 2015-09-12 DIAGNOSIS — E860 Dehydration: Secondary | ICD-10-CM | POA: Diagnosis present

## 2015-09-12 DIAGNOSIS — I248 Other forms of acute ischemic heart disease: Secondary | ICD-10-CM | POA: Diagnosis present

## 2015-09-12 DIAGNOSIS — N179 Acute kidney failure, unspecified: Principal | ICD-10-CM

## 2015-09-12 DIAGNOSIS — Z888 Allergy status to other drugs, medicaments and biological substances status: Secondary | ICD-10-CM

## 2015-09-12 DIAGNOSIS — R778 Other specified abnormalities of plasma proteins: Secondary | ICD-10-CM

## 2015-09-12 DIAGNOSIS — I6522 Occlusion and stenosis of left carotid artery: Secondary | ICD-10-CM | POA: Diagnosis not present

## 2015-09-12 DIAGNOSIS — I739 Peripheral vascular disease, unspecified: Secondary | ICD-10-CM

## 2015-09-12 DIAGNOSIS — I69393 Ataxia following cerebral infarction: Secondary | ICD-10-CM | POA: Diagnosis not present

## 2015-09-12 DIAGNOSIS — Z8673 Personal history of transient ischemic attack (TIA), and cerebral infarction without residual deficits: Secondary | ICD-10-CM | POA: Diagnosis not present

## 2015-09-12 DIAGNOSIS — K259 Gastric ulcer, unspecified as acute or chronic, without hemorrhage or perforation: Secondary | ICD-10-CM | POA: Diagnosis not present

## 2015-09-12 DIAGNOSIS — R1310 Dysphagia, unspecified: Secondary | ICD-10-CM

## 2015-09-12 DIAGNOSIS — I351 Nonrheumatic aortic (valve) insufficiency: Secondary | ICD-10-CM | POA: Diagnosis present

## 2015-09-12 DIAGNOSIS — Z823 Family history of stroke: Secondary | ICD-10-CM | POA: Diagnosis not present

## 2015-09-12 DIAGNOSIS — Z79899 Other long term (current) drug therapy: Secondary | ICD-10-CM

## 2015-09-12 DIAGNOSIS — E1122 Type 2 diabetes mellitus with diabetic chronic kidney disease: Secondary | ICD-10-CM | POA: Diagnosis not present

## 2015-09-12 DIAGNOSIS — Z7901 Long term (current) use of anticoagulants: Secondary | ICD-10-CM | POA: Diagnosis not present

## 2015-09-12 DIAGNOSIS — I6350 Cerebral infarction due to unspecified occlusion or stenosis of unspecified cerebral artery: Secondary | ICD-10-CM | POA: Diagnosis not present

## 2015-09-12 DIAGNOSIS — I779 Disorder of arteries and arterioles, unspecified: Secondary | ICD-10-CM

## 2015-09-12 DIAGNOSIS — I6529 Occlusion and stenosis of unspecified carotid artery: Secondary | ICD-10-CM | POA: Diagnosis not present

## 2015-09-12 DIAGNOSIS — N281 Cyst of kidney, acquired: Secondary | ICD-10-CM | POA: Diagnosis present

## 2015-09-12 DIAGNOSIS — I2510 Atherosclerotic heart disease of native coronary artery without angina pectoris: Secondary | ICD-10-CM | POA: Diagnosis not present

## 2015-09-12 DIAGNOSIS — Z9049 Acquired absence of other specified parts of digestive tract: Secondary | ICD-10-CM | POA: Diagnosis not present

## 2015-09-12 DIAGNOSIS — I69398 Other sequelae of cerebral infarction: Secondary | ICD-10-CM | POA: Diagnosis not present

## 2015-09-12 DIAGNOSIS — R05 Cough: Secondary | ICD-10-CM | POA: Diagnosis not present

## 2015-09-12 LAB — COMPREHENSIVE METABOLIC PANEL
ALK PHOS: 55 U/L (ref 38–126)
ALT: 18 U/L (ref 17–63)
AST: 29 U/L (ref 15–41)
Albumin: 4.1 g/dL (ref 3.5–5.0)
Anion gap: 9 (ref 5–15)
BUN: 47 mg/dL — AB (ref 6–20)
CALCIUM: 9.3 mg/dL (ref 8.9–10.3)
CHLORIDE: 103 mmol/L (ref 101–111)
CO2: 23 mmol/L (ref 22–32)
CREATININE: 2.52 mg/dL — AB (ref 0.61–1.24)
GFR calc non Af Amer: 21 mL/min — ABNORMAL LOW (ref >60)
GFR, EST AFRICAN AMERICAN: 25 mL/min — AB (ref >60)
GLUCOSE: 108 mg/dL — AB (ref 65–99)
Potassium: 4.7 mmol/L (ref 3.5–5.1)
SODIUM: 135 mmol/L (ref 135–145)
Total Bilirubin: 0.7 mg/dL (ref 0.3–1.2)
Total Protein: 7.7 g/dL (ref 6.5–8.1)

## 2015-09-12 LAB — CBC WITH DIFFERENTIAL/PLATELET
BASOS PCT: 1 %
Basophils Absolute: 0.1 10*3/uL (ref 0–0.1)
Eosinophils Absolute: 0.3 10*3/uL (ref 0–0.7)
Eosinophils Relative: 3 %
HEMATOCRIT: 41.2 % (ref 40.0–52.0)
HEMOGLOBIN: 13.7 g/dL (ref 13.0–18.0)
LYMPHS PCT: 9 %
Lymphs Abs: 1 10*3/uL (ref 1.0–3.6)
MCH: 29.1 pg (ref 26.0–34.0)
MCHC: 33.4 g/dL (ref 32.0–36.0)
MCV: 87.3 fL (ref 80.0–100.0)
MONOS PCT: 8 %
Monocytes Absolute: 0.9 10*3/uL (ref 0.2–1.0)
NEUTROS ABS: 8 10*3/uL — AB (ref 1.4–6.5)
NEUTROS PCT: 79 %
Platelets: 247 10*3/uL (ref 150–440)
RBC: 4.72 MIL/uL (ref 4.40–5.90)
RDW: 14.2 % (ref 11.5–14.5)
WBC: 10.2 10*3/uL (ref 3.8–10.6)

## 2015-09-12 LAB — URINALYSIS COMPLETE WITH MICROSCOPIC (ARMC ONLY)
BILIRUBIN URINE: NEGATIVE
GLUCOSE, UA: NEGATIVE mg/dL
KETONES UR: NEGATIVE mg/dL
LEUKOCYTES UA: NEGATIVE
NITRITE: NEGATIVE
PROTEIN: 100 mg/dL — AB
SPECIFIC GRAVITY, URINE: 1.004 — AB (ref 1.005–1.030)
SQUAMOUS EPITHELIAL / LPF: NONE SEEN
pH: 6 (ref 5.0–8.0)

## 2015-09-12 LAB — ETHANOL: Alcohol, Ethyl (B): <5 mg/dL (ref <5)

## 2015-09-12 LAB — TROPONIN I: Troponin I: 0.05 ng/mL — ABNORMAL HIGH (ref <0.031)

## 2015-09-12 LAB — LIPASE, BLOOD: LIPASE: 32 U/L (ref 11–51)

## 2015-09-12 MED ORDER — SODIUM CHLORIDE 0.9 % IV SOLN
INTRAVENOUS | Status: DC
  Administered 2015-09-13 (×3): via INTRAVENOUS

## 2015-09-12 MED ORDER — SODIUM CHLORIDE 0.9 % IV BOLUS (SEPSIS)
1000.0000 mL | Freq: Once | INTRAVENOUS | Status: AC
  Administered 2015-09-12: 1000 mL via INTRAVENOUS

## 2015-09-12 NOTE — ED Notes (Signed)
Pt went to PCP this Wednesday for weakness, PCP told him he only had some dehydration. Pt states his weakness is worse now.

## 2015-09-12 NOTE — ED Provider Notes (Signed)
[facility] Emergency Department Provider Note  ____________________________________________  Time seen: 8:30 PM  I have reviewed the triage vital signs and the nursing notes.   HISTORY  Chief Complaint Dizziness; Gait Problem; and Hypertension    HPI Gary Ortega is a 82 y.o. male brought to the ED by EMS due to dizziness and generalized weakness for the past 3 days. Worsening. No vomiting chest pain or abdominal pain. Also having a very hard time walking and feels like he cannot keep his balance at all.     Past Medical History  Diagnosis Date  . BPH (benign prostatic hypertrophy) with urinary obstruction   . CVA (cerebral infarction)   . Hypertension   . Parkinson disease ([place])   . Renal disorder   . Stroke ([place])   . Cancer [facility])      Patient Active Problem List   Diagnosis Date Noted  . ASHD  04/02/2015  . Depression, controlled 01/18/2015  . T2_NIDDM w/ CKD 4 (GFR 26 ml/min) 01/15/2015  . History of nephrectomy, unilateral 10/07/2014  . Parkinson's disease ([place]) 03/27/2014  . Medication management 11/29/2013  . BPH (benign prostatic hypertrophy) with urinary obstruction   . TIA (transient ischemic attack) 06/30/2011  . Hyperlipidemia 06/03/2010  . Vitamin D deficiency 06/02/2010  . Essential hypertension 06/02/2010     Past Surgical History  Procedure Laterality Date  . Colonoscopy    . Cystoscopy      prostate radioactive I-125 seed impklantation   . Mandible fracture surgery  1948  . Appendectomy    . Hemorrhoid surgery    . Cataract extraction    . Total nephrectomy Right 2012     Current Outpatient Rx  Name  Route  Sig  Dispense  Refill  . carbidopa-levodopa (SINEMET IR) 25-250 MG tablet   Oral   Take 1 tablet by mouth 3 (three) times daily.   270 tablet   1   . Cholecalciferol (VITAMIN D3) 5000 UNITS CAPS   Oral   Take by mouth daily.           . clopidogrel (PLAVIX) 75 MG tablet      TAKE 1 TABLET  DAILY TO     PREVENT STROKE   90 tablet   1   . Fish Oil OIL   Oral   Take by mouth daily. 1 tablespoon          . ketoconazole (NIZORAL) 2 % shampoo               . LOCOID 0.1 % LOTN                 Dispense as written.   Marland Kitchen losartan (COZAAR) 100 MG tablet      Take 1/2 to 1 tablet daily for BP as directed   30 tablet   99   . metoprolol succinate (TOPROL-XL) 25 MG 24 hr tablet      1/2-1 pill at night for blood pressure   90 tablet   1   . multivitamin (THERAGRAN) per tablet   Oral   Take 1 tablet by mouth daily.           . nitroGLYCERIN (NITROSTAT) 0.4 MG SL tablet   Sublingual   Place 1 tablet (0.4 mg total) under the tongue every 5 (five) minutes as needed for chest pain. One tablet under tongue at onset of chest pain; may repeat every 5 minutes for up to 3 doses   30  tablet   PRN   . vitamin C (ASCORBIC ACID) 500 MG tablet   Oral   Take 500 mg by mouth daily.            Allergies Ace inhibitors; Lisinopril; Nsaids; and Wellbutrin   Family History  Problem Relation Age of Onset  . Stroke Mother   . Hypertension Mother   . Stroke Father   . Hypertension Brother     Social History Social History  Substance Use Topics  . Smoking status: Never Smoker   . Smokeless tobacco: None  . Alcohol Use: No    Review of Systems  Constitutional:   No fever or chills. No weight changes Eyes:   No blurry vision or double vision.  ENT:   No sore throat.  Cardiovascular:   No chest pain. Respiratory:   No dyspnea or cough. Gastrointestinal:   Negative for abdominal pain, vomiting and diarrhea.  No BRBPR or melena. Genitourinary:   Negative for dysuria or difficulty urinating. Musculoskeletal:   Negative for back pain. No joint swelling or pain. Skin:   Negative for rash. Neurological:   Positive for dizziness, difficulty walking. No focal weakness or paresthesia. Psychiatric:  No anxiety or depression.   Endocrine:  No changes in energy or  sleep difficulty.  10-point ROS otherwise negative.  ____________________________________________   PHYSICAL EXAM:  VITAL SIGNS: ED Triage Vitals  Enc Vitals Group     BP 09/12/15 2037 177/95 mmHg     Pulse Rate 09/12/15 2037 73     Resp 09/12/15 2037 11     Temp 09/12/15 2037 98.2 F (36.8 C)     Temp Source 09/12/15 2037 Oral     SpO2 09/12/15 2037 97 %     Weight 09/12/15 2037 155 lb (70.308 kg)     Height 09/12/15 2037 5\' 9"  (1.753 m)     Head Cir --      Peak Flow --      Pain Score --      Pain Loc --      Pain Edu? --      Excl. in [place]? --     Vital signs reviewed, nursing assessments reviewed.   Constitutional:   Alert and oriented. Well appearing and in no distress. Eyes:   No scleral icterus. No conjunctival pallor. PERRL. EOMI ENT   Head:   Normocephalic and atraumatic.   Nose:   No congestion/rhinnorhea. No septal hematoma   Mouth/Throat:   MMM, no pharyngeal erythema. No peritonsillar mass.    Neck:   No stridor. No SubQ emphysema. No meningismus. Hematological/Lymphatic/Immunilogical:   No cervical lymphadenopathy. Cardiovascular:   RRR. Symmetric bilateral radial and DP pulses.  No murmurs.  Respiratory:   Normal respiratory effort without tachypnea nor retractions. Breath sounds are clear and equal bilaterally. Bilateral basilar crackles Gastrointestinal:   Soft and nontender. Non distended. There is no CVA tenderness.  No rebound, rigidity, or guarding. Genitourinary:   deferred Musculoskeletal:   Nontender with normal range of motion in all extremities. No joint effusions.  No lower extremity tenderness.  No edema. Neurologic:   Normal speech and language.  CN 2-10 normal. Motor grossly intact. Severely ataxic gait, patient unable to balance himself even with assistance while standing still. No gross focal neurologic deficits are appreciated.  Skin:    Skin is warm, dry and intact. No rash noted.  No petechiae, purpura, or  bullae. Psychiatric:   Mood and affect are normal. No SI or hallucinations  ____________________________________________    LABS (pertinent positives/negatives) (all labs ordered are listed, but only abnormal results are displayed) Labs Reviewed  COMPREHENSIVE METABOLIC PANEL - Abnormal; Notable for the following:    Glucose, Bld 108 (*)    BUN 47 (*)    Creatinine, Ser 2.52 (*)    GFR calc non Af Amer 21 (*)    GFR calc Af Amer 25 (*)    All other components within normal limits  TROPONIN I - Abnormal; Notable for the following:    Troponin I 0.05 (*)    All other components within normal limits  CBC WITH DIFFERENTIAL/PLATELET - Abnormal; Notable for the following:    Neutro Abs 8.0 (*)    All other components within normal limits  URINALYSIS COMPLETEWITH MICROSCOPIC ([facility] ONLY) - Abnormal; Notable for the following:    Color, Urine STRAW (*)    APPearance CLEAR (*)    Specific Gravity, Urine 1.004 (*)    Hgb urine dipstick 1+ (*)    Protein, ur 100 (*)    Bacteria, UA RARE (*)    All other components within normal limits  ETHANOL  LIPASE, BLOOD   ____________________________________________   EKG  Interpreted by me Normal sinus rhythm rate of 72, left axis, left bundle branch block, normal intervals. Normal QRS ST segments and T waves  ____________________________________________    RADIOLOGY  Chest x-ray unremarkable  ____________________________________________   PROCEDURES   ____________________________________________   INITIAL IMPRESSION / ASSESSMENT AND PLAN / ED COURSE  Pertinent labs & imaging results that were available during my care of the patient were reviewed by me and considered in my medical decision making (see chart for details).  Patient presents with dizziness and difficulty walking, found to be severely ataxic. We'll get CT head and lab workup. Plan for eventual hospitalization.    ----------------------------------------- 10:12  PM on 09/12/2015 -----------------------------------------  Troponin slightly elevated at 0.05. We'll discuss with hospitalist for further monitoring. With neurologic findings as well as patient does not appear to be suitable for outpatient follow-up..     ____________________________________________   FINAL CLINICAL IMPRESSION(S) / ED DIAGNOSES  Final diagnoses:  Dizziness  Ataxia  Elevated troponin      Carrie Mew, MD 09/16/15 1217

## 2015-09-12 NOTE — H&P (Signed)
History and Physical    Gary Ortega [id number] DOB: 01/30/1925 DOA: 09/12/2015  Referring physician: Dr. Joni Fears PCP: Alesia Richards, MD  Specialists: none  Chief Complaint: weakness  HPI: Gary Ortega is a 82 y.o. male has a past medical history significant for Parkinson's disease and previous CVA now with progressive weakness and ataxia. Unable to ambulate at home. No fever. Denies CP or SOB. In the ER, labs show ARF with dehydration. Head CT non-acute. Wife unable to care for patient at home. He is now admitted.  Review of Systems: The patient denies anorexia, fever, weight loss,, vision loss, decreased hearing, hoarseness, chest pain, syncope, dyspnea on exertion, peripheral edema,  hemoptysis, abdominal pain, melena, hematochezia, severe indigestion/heartburn, hematuria, incontinence, genital sores, muscle weakness, suspicious skin lesions, transient blindness,  depression, unusual weight change, abnormal bleeding, enlarged lymph nodes, angioedema, and breast masses.   Past Medical History  Diagnosis Date  . BPH (benign prostatic hypertrophy) with urinary obstruction   . CVA (cerebral infarction)   . Hypertension   . Parkinson disease ([place])   . Renal disorder   . Stroke ([place])   . Cancer [facility])    Past Surgical History  Procedure Laterality Date  . Colonoscopy    . Cystoscopy      prostate radioactive I-125 seed impklantation   . Mandible fracture surgery  1948  . Appendectomy    . Hemorrhoid surgery    . Cataract extraction    . Total nephrectomy Right 2012   Social History:  reports that he has never smoked. He does not have any smokeless tobacco history on file. He reports that he does not drink alcohol. His drug history is not on file.  Allergies  Allergen Reactions  . Ace Inhibitors   . Lisinopril     Affects kidney  . Nsaids   . Wellbutrin [Bupropion]     confusion    Family History  Problem Relation Age of Onset  . Stroke Mother    . Hypertension Mother   . Stroke Father   . Hypertension Brother     Prior to Admission medications   Medication Sig Start Date End Date Taking? Authorizing Provider  carbidopa-levodopa (SINEMET IR) 25-250 MG tablet Take 1 tablet by mouth 3 (three) times daily. 05/08/15   Unk Pinto, MD  Cholecalciferol (VITAMIN D3) 5000 UNITS CAPS Take by mouth daily.      Historical Provider, MD  clopidogrel (PLAVIX) 75 MG tablet TAKE 1 TABLET DAILY TO     PREVENT STROKE 08/01/15   Unk Pinto, MD  Fish Oil OIL Take by mouth daily. 1 tablespoon     Historical Provider, MD  ketoconazole (NIZORAL) 2 % shampoo  03/17/15   Historical Provider, MD  LOCOID 0.1 % LOTN  03/18/15   Historical Provider, MD  losartan (COZAAR) 100 MG tablet Take 1/2 to 1 tablet daily for BP as directed 02/18/15 02/18/16  Unk Pinto, MD  metoprolol succinate (TOPROL-XL) 25 MG 24 hr tablet 1/2-1 pill at night for blood pressure 10/07/14   Vicie Mutters, PA-C  multivitamin [facility]) per tablet Take 1 tablet by mouth daily.      Historical Provider, MD  nitroGLYCERIN (NITROSTAT) 0.4 MG SL tablet Place 1 tablet (0.4 mg total) under the tongue every 5 (five) minutes as needed for chest pain. One tablet under tongue at onset of chest pain; may repeat every 5 minutes for up to 3 doses 06/12/14   Unk Pinto, MD  vitamin C (ASCORBIC ACID) 500  MG tablet Take 500 mg by mouth daily.    Historical Provider, MD   Physical Exam: Filed Vitals:   09/12/15 2037 09/12/15 2054 09/12/15 2241  BP: 177/95  180/87  Pulse: 73 70 75  Temp: 98.2 F (36.8 C)    TempSrc: Oral    Resp: 11 33 19  Height: 5\' 9"  (1.753 m)    Weight: 70.308 kg (155 lb)    SpO2: 97% 95% 95%     General:  Acutely ill appearing, lethargic  Eyes: PERRL, EOMI, no scleral icterus, conjunctiva clear  ENT: moist oropharynx, poor dentition  Neck: supple, no lymphadenopathy. No thyromegaly  Cardiovascular: regular rate without MRG; 2+ peripheral pulses, no JVD,  trace peripheral edema  Respiratory: CTA biL, good air movement without wheezing, rhonchi or crackled  Abdomen: soft, non tender to palpation, positive bowel sounds, no guarding, no rebound  Skin: no rashes  Musculoskeletal: normal bulk and tone, no joint swelling  Psychiatric: normal mood and affect  Neurologic: CN 2-12 grossly intact, MS 4/5 in all 4 muscle groups, gait ataxic, DTR's symmetric  Labs on Admission:  Basic Metabolic Panel:  Recent Labs Lab 09/09/15 1651 09/12/15 2041  NA 136 135  K 4.9 4.7  CL 101 103  CO2 23 23  GLUCOSE 111* 108*  BUN 54* 47*  CREATININE 3.06* 2.52*  CALCIUM 9.6 9.3  MG 2.5  --    Liver Function Tests:  Recent Labs Lab 09/09/15 1651 09/12/15 2041  AST 19 29  ALT 5* 18  ALKPHOS 58 55  BILITOT 0.3 0.7  PROT 7.2 7.7  ALBUMIN 4.2 4.1    Recent Labs Lab 09/12/15 2041  LIPASE 32   No results for input(s): AMMONIA in the last 168 hours. CBC:  Recent Labs Lab 09/09/15 1651 09/12/15 2041  WBC 8.3 10.2  NEUTROABS 5.9 8.0*  HGB 13.6 13.7  HCT 41.3 41.2  MCV 89.2 87.3  PLT 258 247   Cardiac Enzymes:  Recent Labs Lab 09/09/15 1651 09/12/15 2041  CKTOTAL 73  --   TROPONINI  --  0.05*    BNP (last 3 results) No results for input(s): BNP in the last 8760 hours.  ProBNP (last 3 results) No results for input(s): PROBNP in the last 8760 hours.  CBG: No results for input(s): GLUCAP in the last 168 hours.  Radiological Exams on Admission: Dg Chest 1 View  09/12/2015  CLINICAL DATA:  Weakness and hypertension. Cough. Dizziness. Ataxia. EXAM: CHEST 1 VIEW COMPARISON:  05/17/2015 FINDINGS: Shallow inspiration with atelectasis in the lung bases. Heart size and pulmonary vascularity are normal for technique. No focal consolidation. No blunting of costophrenic angles. No pneumothorax. Tortuous aorta. Degenerative changes in the spine. Appearance of the chest is similar to previous study. IMPRESSION: Shallow inspiration with  probable atelectasis in the lung bases. Electronically Signed   By: Lucienne Capers M.D.   On: 09/12/2015 21:39   Ct Head Wo Contrast  09/12/2015  CLINICAL DATA:  Hypertension, dizziness, and difficulty walking for 3 days. Difficulty keeping balance and falling backwards. History of CVA with residual left-sided deficit. EXAM: CT HEAD WITHOUT CONTRAST TECHNIQUE: Contiguous axial images were obtained from the base of the skull through the vertex without intravenous contrast. COMPARISON:  05/19/2015 FINDINGS: Diffuse cerebral atrophy. Ventricular dilatation consistent with central atrophy. Low-attenuation changes in the deep white matter consistent with small vessel ischemia. Focal area of mild encephalomalacia in the left insular region is unchanged since previous study. Old right cerebellar infarct. Focal calcification  adjacent to the right side of the falx is unchanged since previous study and possibly represents a meningioma or dystrophic calcification. Gray-white matter junctions are mostly distinct. Basal cisterns are not effaced. No abnormal extra-axial fluid collections. No mass effect or midline shift. No evidence of acute intracranial hemorrhage. Calvarium appears intact. Vascular calcifications. Mild mucosal thickening in the paranasal sinuses. No acute air-fluid levels. IMPRESSION: No acute intracranial abnormalities are demonstrated. Unchanged chronic findings as discussed, including atrophy with small vessel ischemic change, old left insular and old right cerebellar infarcts, and calcification along the right side of the falx. Electronically Signed   By: Lucienne Capers M.D.   On: 09/12/2015 22:12    EKG: Independently reviewed.  Assessment/Plan Principal Problem:   ARF (acute renal failure) (HCC) Active Problems:   Parkinson's disease (HCC)   Ataxia   Weakness generalized   Dehydration   Will admit to floor and order MRI of brain. Consult Neurology. Begin IV fluids. F/u labs in AM.  Order PT and CSW consults as well.  Diet: soft Fluids: NS@100  DVT Prophylaxis: SQ Heparin  Code Status: FULL  Family Communication: yes  Disposition Plan: SNF  Time spent: 50 min

## 2015-09-12 NOTE — ED Notes (Signed)
Pt to ED via [facility] from home c/o hypertension, dizziness with standing x3 days and difficulty walking per pt. Pt usually uses walker at home but is having difficulty keeping his balance and falls backward. Pt has hx CVA with residual left-sided deficit per EMS.

## 2015-09-13 ENCOUNTER — Inpatient Hospital Stay: Payer: Medicare Other

## 2015-09-13 DIAGNOSIS — N179 Acute kidney failure, unspecified: Principal | ICD-10-CM

## 2015-09-13 DIAGNOSIS — R270 Ataxia, unspecified: Secondary | ICD-10-CM

## 2015-09-13 DIAGNOSIS — I639 Cerebral infarction, unspecified: Secondary | ICD-10-CM

## 2015-09-13 LAB — TROPONIN I
TROPONIN I: 0.04 ng/mL — AB (ref <0.031)
Troponin I: 0.07 ng/mL — ABNORMAL HIGH (ref <0.031)

## 2015-09-13 LAB — COMPREHENSIVE METABOLIC PANEL
ALT: 19 U/L (ref 17–63)
ANION GAP: 8 (ref 5–15)
AST: 25 U/L (ref 15–41)
Albumin: 3.5 g/dL (ref 3.5–5.0)
Alkaline Phosphatase: 49 U/L (ref 38–126)
BUN: 42 mg/dL — ABNORMAL HIGH (ref 6–20)
CHLORIDE: 111 mmol/L (ref 101–111)
CO2: 22 mmol/L (ref 22–32)
Calcium: 8.8 mg/dL — ABNORMAL LOW (ref 8.9–10.3)
Creatinine, Ser: 2.33 mg/dL — ABNORMAL HIGH (ref 0.61–1.24)
GFR, EST AFRICAN AMERICAN: 27 mL/min — AB (ref >60)
GFR, EST NON AFRICAN AMERICAN: 23 mL/min — AB (ref >60)
Glucose, Bld: 112 mg/dL — ABNORMAL HIGH (ref 65–99)
POTASSIUM: 4.3 mmol/L (ref 3.5–5.1)
Sodium: 141 mmol/L (ref 135–145)
Total Bilirubin: 0.7 mg/dL (ref 0.3–1.2)
Total Protein: 6.5 g/dL (ref 6.5–8.1)

## 2015-09-13 LAB — CBC
HCT: 37.2 % — ABNORMAL LOW (ref 40.0–52.0)
Hemoglobin: 12.6 g/dL — ABNORMAL LOW (ref 13.0–18.0)
MCH: 30.3 pg (ref 26.0–34.0)
MCHC: 33.9 g/dL (ref 32.0–36.0)
MCV: 89.3 fL (ref 80.0–100.0)
PLATELETS: 220 10*3/uL (ref 150–440)
RBC: 4.17 MIL/uL — ABNORMAL LOW (ref 4.40–5.90)
RDW: 14.5 % (ref 11.5–14.5)
WBC: 10.9 10*3/uL — AB (ref 3.8–10.6)

## 2015-09-13 LAB — GLUCOSE, CAPILLARY: GLUCOSE-CAPILLARY: 115 mg/dL — AB (ref 65–99)

## 2015-09-13 MED ORDER — DOCUSATE SODIUM 100 MG PO CAPS
100.0000 mg | ORAL_CAPSULE | Freq: Two times a day (BID) | ORAL | Status: DC
  Administered 2015-09-13 – 2015-09-15 (×5): 100 mg via ORAL
  Filled 2015-09-13 (×5): qty 1

## 2015-09-13 MED ORDER — ACETAMINOPHEN 650 MG RE SUPP: 650.0000 mg | Freq: Four times a day (QID) | RECTAL | Status: DC | PRN

## 2015-09-13 MED ORDER — ACETAMINOPHEN 325 MG PO TABS: 650.0000 mg | ORAL_TABLET | Freq: Four times a day (QID) | ORAL | Status: DC | PRN

## 2015-09-13 MED ORDER — NITROGLYCERIN 0.4 MG SL SUBL: 0.4000 mg | SUBLINGUAL_TABLET | SUBLINGUAL | Status: DC | PRN

## 2015-09-13 MED ORDER — FAMOTIDINE 20 MG PO TABS
20.0000 mg | ORAL_TABLET | Freq: Every day | ORAL | Status: DC
  Administered 2015-09-13 – 2015-09-14 (×2): 20 mg via ORAL
  Filled 2015-09-13 (×2): qty 1

## 2015-09-13 MED ORDER — CARBIDOPA-LEVODOPA 25-250 MG PO TABS
1.0000 | ORAL_TABLET | Freq: Three times a day (TID) | ORAL | Status: DC
  Administered 2015-09-13 – 2015-09-15 (×8): 1 via ORAL
  Filled 2015-09-13 (×10): qty 1

## 2015-09-13 MED ORDER — LOSARTAN POTASSIUM 50 MG PO TABS: 50.0000 mg | ORAL_TABLET | Freq: Every day | ORAL | Status: DC

## 2015-09-13 MED ORDER — METOPROLOL SUCCINATE ER 25 MG PO TB24: 25.0000 mg | ORAL_TABLET | Freq: Every day | ORAL | Status: DC

## 2015-09-13 MED ORDER — ONDANSETRON HCL 4 MG/2ML IJ SOLN: 4.0000 mg | Freq: Four times a day (QID) | INTRAMUSCULAR | Status: DC | PRN

## 2015-09-13 MED ORDER — BISACODYL 10 MG RE SUPP
10.0000 mg | Freq: Every day | RECTAL | Status: DC | PRN
  Filled 2015-09-13: qty 1

## 2015-09-13 MED ORDER — MORPHINE SULFATE (PF) 2 MG/ML IV SOLN: 2.0000 mg | INTRAVENOUS | Status: DC | PRN

## 2015-09-13 MED ORDER — ATORVASTATIN CALCIUM 20 MG PO TABS
40.0000 mg | ORAL_TABLET | Freq: Every day | ORAL | Status: DC
  Administered 2015-09-13 – 2015-09-15 (×4): 40 mg via ORAL
  Filled 2015-09-13 (×4): qty 2

## 2015-09-13 MED ORDER — LABETALOL HCL 200 MG PO TABS
100.0000 mg | ORAL_TABLET | Freq: Two times a day (BID) | ORAL | Status: DC
  Administered 2015-09-13 – 2015-09-15 (×5): 100 mg via ORAL
  Filled 2015-09-13 (×6): qty 1

## 2015-09-13 MED ORDER — CLOPIDOGREL BISULFATE 75 MG PO TABS
75.0000 mg | ORAL_TABLET | Freq: Every day | ORAL | Status: DC
  Administered 2015-09-13 – 2015-09-15 (×3): 75 mg via ORAL
  Filled 2015-09-13 (×3): qty 1

## 2015-09-13 MED ORDER — ONDANSETRON HCL 4 MG PO TABS: 4.0000 mg | ORAL_TABLET | Freq: Four times a day (QID) | ORAL | Status: DC | PRN

## 2015-09-13 MED ORDER — HEPARIN SODIUM (PORCINE) 5000 UNIT/ML IJ SOLN
5000.0000 [IU] | Freq: Three times a day (TID) | INTRAMUSCULAR | Status: DC
  Administered 2015-09-13 – 2015-09-15 (×8): 5000 [IU] via SUBCUTANEOUS
  Filled 2015-09-13 (×8): qty 1

## 2015-09-13 MED ORDER — ADULT MULTIVITAMIN W/MINERALS CH
1.0000 | ORAL_TABLET | Freq: Every day | ORAL | Status: DC
  Administered 2015-09-13 – 2015-09-15 (×3): 1 via ORAL
  Filled 2015-09-13 (×3): qty 1

## 2015-09-13 NOTE — ED Notes (Signed)
Per Dr Corky Downs may use Coude catheter.

## 2015-09-13 NOTE — Progress Notes (Addendum)
[place] [facility] [place] NAME: Gary Ortega    MR#:  [id number]  DATE OF BIRTH:  1925-04-27  SUBJECTIVE:  CHIEF COMPLAINT:   Chief Complaint  Patient presents with  . Dizziness  . Gait Problem  . Hypertension   Patient is 82 year old male with a history significant for history of Parkinson's disease, prior stroke who presents to the hospital with complaints of progressive weakness and ataxia. On arrival to the hospital. He was noted to be in acute renal failure, he was given IV fluids and his condition improved. Today, feels good. Denies any pain, admits of some weakness in lower extremities.  Review of Systems  Constitutional: Negative for fever, chills and weight loss.  HENT: Negative for congestion.   Eyes: Negative for blurred vision and double vision.  Respiratory: Negative for cough, sputum production, shortness of breath and wheezing.   Cardiovascular: Negative for chest pain, palpitations, orthopnea, leg swelling and PND.  Gastrointestinal: Negative for nausea, vomiting, abdominal pain, diarrhea, constipation and blood in stool.  Genitourinary: Negative for dysuria, urgency, frequency and hematuria.  Musculoskeletal: Negative for falls.  Neurological: Negative for dizziness, tremors, focal weakness and headaches.  Endo/Heme/Allergies: Does not bruise/bleed easily.  Psychiatric/Behavioral: Negative for depression. The patient does not have insomnia.     VITAL SIGNS: Blood pressure 120/60, pulse 64, temperature 98.4 F (36.9 C), temperature source Oral, resp. rate 18, height 5\' 8"  (1.727 m), weight 76.295 kg (168 lb 3.2 oz), SpO2 96 %.  PHYSICAL EXAMINATION:   GENERAL:  82 y.o.-year-old patient lying in the bed with no acute distress.  EYES: Pupils equal, round, reactive to light and accommodation. No scleral icterus. Extraocular muscles intact.  HEENT: Head atraumatic, normocephalic. Oropharynx and nasopharynx clear.   NECK:  Supple, no jugular venous distention. No thyroid enlargement, no tenderness.  LUNGS: Normal breath sounds bilaterally, no wheezing, rales,rhonchi or crepitation. No use of accessory muscles of respiration.  CARDIOVASCULAR: S1, S2 normal. No murmurs, rubs, or gallops.  ABDOMEN: Soft, nontender, nondistended. Bowel sounds present. No organomegaly or mass.  EXTREMITIES: No pedal edema, cyanosis, or clubbing.  NEUROLOGIC: Cranial nerves II through XII are intact. Muscle strength 5/5 in all extremities. Sensation intact. Gait not checked.  PSYCHIATRIC: The patient is alert and oriented x 3.  SKIN: No obvious rash, lesion, or ulcer.   ORDERS/RESULTS REVIEWED:   CBC  Recent Labs Lab 09/09/15 1651 09/12/15 2041 09/13/15 0503  WBC 8.3 10.2 10.9*  HGB 13.6 13.7 12.6*  HCT 41.3 41.2 37.2*  PLT 258 247 220  MCV 89.2 87.3 89.3  MCH 29.4 29.1 30.3  MCHC 32.9 33.4 33.9  RDW 14.3 14.2 14.5  LYMPHSABS 1.4 1.0  --   MONOABS 0.6 0.9  --   EOSABS 0.3 0.3  --   BASOSABS 0.1 0.1  --    ------------------------------------------------------------------------------------------------------------------  Chemistries   Recent Labs Lab 09/09/15 1651 09/12/15 2041 09/13/15 0503  NA 136 135 141  K 4.9 4.7 4.3  CL 101 103 111  CO2 23 23 22   GLUCOSE 111* 108* 112*  BUN 54* 47* 42*  CREATININE 3.06* 2.52* 2.33*  CALCIUM 9.6 9.3 8.8*  MG 2.5  --   --   AST 19 29 25   ALT 5* 18 19  ALKPHOS 58 55 49  BILITOT 0.3 0.7 0.7   ------------------------------------------------------------------------------------------------------------------ estimated creatinine clearance is 21.2 mL/min (by C-G formula based on Cr of 2.33). ------------------------------------------------------------------------------------------------------------------ No results for input(s): TSH, T4TOTAL, T3FREE, THYROIDAB in  the last 72 hours.  Invalid input(s): FREET3  Cardiac Enzymes  Recent Labs Lab 09/12/15 2041   TROPONINI 0.05*   ------------------------------------------------------------------------------------------------------------------ Invalid input(s): POCBNP ---------------------------------------------------------------------------------------------------------------  RADIOLOGY: Dg Chest 1 View  09/12/2015  CLINICAL DATA:  Weakness and hypertension. Cough. Dizziness. Ataxia. EXAM: CHEST 1 VIEW COMPARISON:  05/17/2015 FINDINGS: Shallow inspiration with atelectasis in the lung bases. Heart size and pulmonary vascularity are normal for technique. No focal consolidation. No blunting of costophrenic angles. No pneumothorax. Tortuous aorta. Degenerative changes in the spine. Appearance of the chest is similar to previous study. IMPRESSION: Shallow inspiration with probable atelectasis in the lung bases. Electronically Signed   By: Lucienne Capers M.D.   On: 09/12/2015 21:39   Ct Head Wo Contrast  09/12/2015  CLINICAL DATA:  Hypertension, dizziness, and difficulty walking for 3 days. Difficulty keeping balance and falling backwards. History of CVA with residual left-sided deficit. EXAM: CT HEAD WITHOUT CONTRAST TECHNIQUE: Contiguous axial images were obtained from the base of the skull through the vertex without intravenous contrast. COMPARISON:  05/19/2015 FINDINGS: Diffuse cerebral atrophy. Ventricular dilatation consistent with central atrophy. Low-attenuation changes in the deep white matter consistent with small vessel ischemia. Focal area of mild encephalomalacia in the left insular region is unchanged since previous study. Old right cerebellar infarct. Focal calcification adjacent to the right side of the falx is unchanged since previous study and possibly represents a meningioma or dystrophic calcification. Gray-white matter junctions are mostly distinct. Basal cisterns are not effaced. No abnormal extra-axial fluid collections. No mass effect or midline shift. No evidence of acute intracranial  hemorrhage. Calvarium appears intact. Vascular calcifications. Mild mucosal thickening in the paranasal sinuses. No acute air-fluid levels. IMPRESSION: No acute intracranial abnormalities are demonstrated. Unchanged chronic findings as discussed, including atrophy with small vessel ischemic change, old left insular and old right cerebellar infarcts, and calcification along the right side of the falx. Electronically Signed   By: Lucienne Capers M.D.   On: 09/12/2015 22:12   Mr Brain Wo Contrast  09/13/2015  CLINICAL DATA:  Ataxia.  Parkinson's. EXAM: MRI HEAD WITHOUT CONTRAST TECHNIQUE: Multiplanar, multiecho pulse sequences of the brain and surrounding structures were obtained without intravenous contrast. COMPARISON:  CT head 09/12/2015, MRI 11/03/2014 FINDINGS: Subcentimeter focus of hyperintensity diffusion-weighted imaging in the right parietal lobe. It is difficult to determine if this is acute infarct versus T2 shine through. Correlate with neurologic exam. No other areas of acute infarct. Moderate to advanced atrophy. Extensive chronic microvascular ischemic change throughout the white matter. Chronic infarct in the cerebellum bilaterally right greater than left. Brainstem intact. Negative for intracranial hemorrhage Calcified extradural mass right parafalcine region unchanged from prior studies and compatible with calcified meningioma. No fluid collection or shift of the midline structures. Pituitary not enlarged. No skull base lesion. Paranasal sinuses clear. IMPRESSION: Possible subcentimeter acute infarct in the right parietal lobe. Alternately, this could represent old infarct with T2 shine through Advanced atrophy.  Extensive chronic ischemic change. Right parafalcine calcified meningioma unchanged. Electronically Signed   By: Franchot Gallo M.D.   On: 09/13/2015 09:59    EKG:  Orders placed or performed during the hospital encounter of 09/12/15  . ED EKG  . ED EKG    ASSESSMENT AND  PLAN:  Principal Problem:   ARF (acute renal failure) (HCC) Active Problems:   Parkinson's disease ([place])   Ataxia   Weakness generalized   Dehydration   Acute cerebral infarction ([place])  #1. Acute on chronic renal failure, underlying CK D  stage IV, improved with IV fluid administration, follow in the morning, get ultrasound of kidneys, creatinine 2.31 in November 2016. We'll ask nephrologist to see patient in consultation as well, getting bladder scan. Urinalysis was unremarkable, unlikely urinary tract infection #2 . Parkinson's disease with progressive motor dysfunction, neurology as well as physical therapy consultations are  pending  #3 acute stroke CT scan of head was unremarkable. However MRI of the brain revealed possible right parietal lobe acute infarct, continue patient on Plavix,  add Lipitor, neurology consultation is pending, getting speech therapist as well as physical therapist involved. Getting echocardiogram as well as carotid ultrasound.  #4. Leukocytosis, likely etiology at this time, possibly related to dehydration, stress, , follow in the morning. Chest x-ray revealed atelectasis at lung bases, urinalysis was unremarkable  5. Elevated troponin, likely demand ischemia as patient had no symptoms, follow cardiac enzymes 3, get cardiologist involved for further recommendations. Get echo.   Management plans discussed with the patient, family and they are in agreement.   DRUG ALLERGIES:  Allergies  Allergen Reactions  . Ace Inhibitors   . Lisinopril     Affects kidney  . Nsaids   . Wellbutrin [Bupropion]     confusion    CODE STATUS:     Code Status Orders        Start     Ordered   09/13/15 0132  Full code   Continuous     09/13/15 0131    Code Status History    Date Active Date Inactive Code Status Order ID Comments User Context   This patient has a current code status but no historical code status.      TOTAL TIME TAKING CARE OF THIS PATIENT: 40  minutes.    Theodoro Grist M.D on 09/13/2015 at 4:06 PM  Between 7am to 6pm - Pager - [phone]  After 6pm go to [email or web address] - password [email or web address] [facility]  [facility]  Office  [phone]  CC: Primary care physician; Alesia Richards, MD

## 2015-09-13 NOTE — ED Notes (Signed)
Dr Joni Fears notified of troponin 0.05.

## 2015-09-13 NOTE — Clinical Social Work Note (Signed)
Clinical Social Work Assessment  Patient Details  Name: Gary Ortega MRN: [id number] Date of Birth: [birth year]  Date of referral:  09/13/15               Reason for consult:  Facility Placement                Permission sought to share information with:  Chartered certified accountant granted to share information::  Yes, Verbal Permission Granted  Name::      Gary Ortega::   Gary Ortega   Relationship::     Contact Information:     Housing/Transportation Living arrangements for the past 2 months:  Gary Ortega of Information:  Patient, Gary Ortega, Adult Children Patient Interpreter Needed:  None Criminal Activity/Legal Involvement Pertinent to Current Situation/Hospitalization:  No - Comment as needed Significant Relationships:  Adult Children Lives with:  Self Do you feel safe going back to the place where you live?  Yes Need for family participation in patient care:  Yes (Comment)  Care giving concerns:  Patient lives alone in [place], [place].    Social Worker assessment / plan:  Gary Ortega (CSW) received SNF consult. PT is recommending SNF. CSW met with patient and his daughter Gary Ortega [phone] was at bedside. Patient was alert and oriented and laying in the bed. CSW introduced self and explained role of CSW department. Patient reported that he lives alone in [place] and his daughter Gary Ortega provides 3 meals a day for him. Daughter reported that she and her sister Gary Ortega share HPOA. CSW explained SNF process. Patient and daughter are agreeable to SNF search and prefer [name]. Per daughter patient's late wife Gary Ortega was at [name]. CSW explained that patient's medicare will pay for rehab if he has a 3 night inpatient qualifying stay. Patient was admitted to inpatient on 09/12/15. Daughter and patient verbalized their understanding.   Employment status:  Retired Radiation protection practitioner:  Medicare PT Recommendations:  [place] / Referral to community resources:  [place]  Patient/Family's Response to care:  Patient and daughter are agreeable to [name] and prefer [name].   Patient/Family's Understanding of and Emotional Response to Diagnosis, Current Treatment, and Prognosis:  Patient and daughter were pleasant throughout assessment.   Emotional Assessment Appearance:  Appears stated age Attitude/Demeanor/Rapport:    Affect (typically observed):  Accepting, Adaptable, Pleasant Orientation:  Oriented to Self, Oriented to Place, Oriented to  Time, Oriented to Situation Alcohol / Substance use:  Not Applicable Psych involvement (Current and /or in the community):  No (Comment)  Discharge Needs  Concerns to be addressed:  Discharge Planning Concerns Readmission within the last 30 days:  No Current discharge risk:  Dependent with Mobility Barriers to Discharge:  Continued Medical Work up   Gary Freshwater, LCSW 09/13/2015, 2:02 PM

## 2015-09-13 NOTE — NC FL2 (Signed)
[place] LEVEL OF CARE SCREENING TOOL     IDENTIFICATION  Patient Name: Gary Ortega Birthdate: 03/28/1925 Sex: male Admission Date (Current Location): 09/12/2015  [place] and [place] Number:  Herbalist and Address:  [facility], [address], [place], [place] [address]      Provider Number: [id number]  Attending Physician Name and Address:  Theodoro Grist, MD  Relative Name and Phone Number:       Current Level of Care: Hospital Recommended Level of Care: [place] Prior Approval Number:    Date Approved/Denied:   PASRR Number:  ([id number])  Discharge Plan: SNF    Current Diagnoses: Patient Active Problem List   Diagnosis Date Noted  . Acute cerebral infarction ([place])   . Ataxia 09/12/2015  . Weakness generalized 09/12/2015  . ARF (acute renal failure) ([place]) 09/12/2015  . Dehydration 09/12/2015  . ASHD  04/02/2015  . Depression, controlled 01/18/2015  . T2_NIDDM w/ CKD 4 (GFR 26 ml/min) 01/15/2015  . History of nephrectomy, unilateral 10/07/2014  . Parkinson's disease ([place]) 03/27/2014  . Medication management 11/29/2013  . BPH (benign prostatic hypertrophy) with urinary obstruction   . TIA (transient ischemic attack) 06/30/2011  . Hyperlipidemia 06/03/2010  . Vitamin D deficiency 06/02/2010  . Essential hypertension 06/02/2010    Orientation RESPIRATION BLADDER Height & Weight     Self, Time, Situation, Place  Normal Incontinent Weight: 168 lb 3.2 oz (76.295 kg) Height:  5\' 8"  (172.7 cm)  BEHAVIORAL SYMPTOMS/MOOD NEUROLOGICAL BOWEL NUTRITION STATUS   (none )  (none ) Continent Diet (Diet DYS 3 )  AMBULATORY STATUS COMMUNICATION OF NEEDS Skin   Extensive Assist Verbally Normal                       Personal Care Assistance Level of Assistance  Bathing, Feeding, Dressing Bathing Assistance: Limited assistance Feeding assistance: Independent Dressing Assistance: Limited  assistance     Functional Limitations Info  Sight, Hearing, Speech Sight Info: Adequate Hearing Info: Adequate Speech Info: Adequate    SPECIAL CARE FACTORS FREQUENCY  PT (By licensed PT)     PT Frequency:  (5)              Contractures      Additional Factors Info  Code Status, Allergies Code Status Info:  (Full Code. ) Allergies Info:  (Ace Inhibitors, Lisinopril, Nsaids, Wellbutrin Bupropion)           Current Medications (09/13/2015):  This is the current hospital active medication list Current Facility-Administered Medications  Medication Dose Route Frequency Provider Last Rate Last Dose  . 0.9 %  sodium chloride infusion   Intravenous Continuous Idelle Crouch, MD 100 mL/hr at 09/13/15 0153    . acetaminophen (TYLENOL) tablet 650 mg  650 mg Oral Q6H PRN Idelle Crouch, MD       Or  . acetaminophen (TYLENOL) suppository 650 mg  650 mg Rectal Q6H PRN Idelle Crouch, MD      . bisacodyl (DULCOLAX) suppository 10 mg  10 mg Rectal Daily PRN Idelle Crouch, MD      . carbidopa-levodopa (SINEMET IR) 25-250 MG per tablet immediate release 1 tablet  1 tablet Oral TID Idelle Crouch, MD   1 tablet at 09/13/15 [phone]  . clopidogrel (PLAVIX) tablet 75 mg  75 mg Oral Daily Idelle Crouch, MD   75 mg at 09/13/15 1051  . docusate sodium (COLACE) capsule 100  mg  100 mg Oral BID Idelle Crouch, MD   100 mg at 09/13/15 1051  . famotidine (PEPCID) tablet 20 mg  20 mg Oral QHS Idelle Crouch, MD   20 mg at 09/13/15 0153  . heparin injection 5,000 Units  5,000 Units Subcutaneous 3 times per day Idelle Crouch, MD   5,000 Units at 09/13/15 [phone]  . labetalol (NORMODYNE) tablet 100 mg  100 mg Oral BID Theodoro Grist, MD   100 mg at 09/13/15 1051  . morphine 2 MG/ML injection 2 mg  2 mg Intravenous Q4H PRN Idelle Crouch, MD      . multivitamin with minerals tablet 1 tablet  1 tablet Oral Q supper Idelle Crouch, MD      . nitroGLYCERIN (NITROSTAT) SL tablet 0.4 mg  0.4  mg Sublingual Q5 min PRN Idelle Crouch, MD      . ondansetron [facility]) tablet 4 mg  4 mg Oral Q6H PRN Idelle Crouch, MD       Or  . ondansetron [facility]) injection 4 mg  4 mg Intravenous Q6H PRN Idelle Crouch, MD         Discharge Medications: Please see discharge summary for a list of discharge medications.  Relevant Imaging Results:  Relevant Lab Results:   Additional Information  (SSN: [id number])  Loralyn Freshwater, LCSW

## 2015-09-13 NOTE — Evaluation (Signed)
Physical Therapy Evaluation Patient Details Name: Gary Ortega MRN: [id number] DOB: April 22, 1925 Today's Date: 09/13/2015   History of Present Illness  Pt admitted with acute renal failure, has been weaker and having more issues getting around.  Wife unable to assist him at home, he has history of Parkinson's and CVA.  Clinical Impression  Pt was limited with PT and is not at his baseline though he was able to do some in-room ambulation with the walker he fatigued and showed some general unsteadiness.  Pt has no pain t/o the session, but is limited with his mobility and confidence. Pt will benefit from short term rehab.    Follow Up Recommendations SNF    Equipment Recommendations       Recommendations for Other Services       Precautions / Restrictions Precautions Precautions: Fall Restrictions Weight Bearing Restrictions: No      Mobility  Bed Mobility Overal bed mobility: Needs Assistance Bed Mobility: Supine to Sit;Sit to Supine     Supine to sit: Mod assist Sit to supine: Mod assist   General bed mobility comments: Pt is able to show some minimal assist, but is ultimately weak and limited with getting to/from EOB  Transfers Overall transfer level: Needs assistance Equipment used: Rolling walker (2 wheeled) Transfers: Sit to/from Stand Sit to Stand: Mod assist         General transfer comment: Pt needs assist to get to standing upright, is able to maintain balance with AD once assisted to standin  Ambulation/Gait Ambulation/Gait assistance: Min assist Ambulation Distance (Feet): 30 Feet Assistive device: Rolling walker (2 wheeled)       General Gait Details: Pt with some minimal unsteadiness and hesitancy.  His biggest issues was fatigue as he was unable to do more than minimal in-room disances.   Stairs            Wheelchair Mobility    Modified Rankin (Stroke Patients Only)       Balance                                              Pertinent Vitals/Pain Pain Assessment: No/denies pain    Home Living Family/patient expects to be discharged to:: Skilled nursing facility                      Prior Function Level of Independence: Independent with assistive device(s)         Comments: reports that he uses a cane more than the walker     Hand Dominance        Extremity/Trunk Assessment   Upper Extremity Assessment: Overall WFL for tasks assessed (L UE grossly 3+ to 4-/5)           Lower Extremity Assessment: Overall WFL for tasks assessed (L LE grossly 3+ to 4-/5)         Communication   Communication: No difficulties  Cognition Arousal/Alertness: Awake/alert Behavior During Therapy: WFL for tasks assessed/performed Overall Cognitive Status: Within Functional Limits for tasks assessed                      General Comments      Exercises        Assessment/Plan    PT Assessment Patient needs continued PT services  PT Diagnosis Difficulty walking;Generalized weakness   PT  Problem List Decreased strength;Decreased range of motion;Decreased activity tolerance;Decreased balance;Decreased mobility;Decreased coordination;Decreased knowledge of use of DME;Decreased safety awareness  PT Treatment Interventions Therapeutic activities;Therapeutic exercise;DME instruction;Gait training;Functional mobility training;Balance training   PT Goals (Current goals can be found in the Care Plan section) Acute Rehab PT Goals Patient Stated Goal: Get stronger PT Goal Formulation: With patient Time For Goal Achievement: 09/27/15 Potential to Achieve Goals: Fair    Frequency Min 2X/week   Barriers to discharge        Co-evaluation               End of Session Equipment Utilized During Treatment: Gait belt Activity Tolerance: Patient limited by fatigue Patient left: with bed alarm set;with call bell/phone within reach           Time: 0831-0843 PT Time  Calculation (min) (ACUTE ONLY): 12 min   Charges:   PT Evaluation $PT Eval Low Complexity: 1 Procedure     PT G Codes:       Wayne Both, PT, DPT [id number]  Kreg Shropshire 09/13/2015, 12:48 PM

## 2015-09-13 NOTE — Progress Notes (Signed)
Spoke with Dr. Estanislado Pandy to verify pt order for splint. Per MD, pass information on to the morning MD.

## 2015-09-13 NOTE — Consult Note (Signed)
Reason for Consult:Difficulty with gait Referring Physician: Ether Griffins  CC: Difficulty with gait   HPI: Gary Ortega is an 82 y.o. male who has a history of PD managed with TID Sinemet.  Per family patient has been having some trouble with gait since 2015. Has been progressively worsening since September of 2016.  Patient has also had trouble getting up from a seated position and has trouble washing his hair because he can not keep his hands up long enough.  His daughter have been showering him for the past month.  On the DOA patient was unable to ambulate at all.  Family describes the patient in a retropulsed posture.  They were unable to handle him further at home and brought him in for evaluation.    Past Medical History  Diagnosis Date  . BPH (benign prostatic hypertrophy) with urinary obstruction   . CVA (cerebral infarction)   . Hypertension   . Parkinson disease ([place])   . Renal disorder   . Stroke ([place])   . Cancer [facility])     Past Surgical History  Procedure Laterality Date  . Colonoscopy    . Cystoscopy      prostate radioactive I-125 seed impklantation   . Mandible fracture surgery  1948  . Appendectomy    . Hemorrhoid surgery    . Cataract extraction    . Total nephrectomy Right 2012    Family History  Problem Relation Age of Onset  . Stroke Mother   . Hypertension Mother   . Stroke Father   . Hypertension Brother     Social History:  reports that he has never smoked. He does not have any smokeless tobacco history on file. He reports that he does not drink alcohol. His drug history is not on file.  Allergies  Allergen Reactions  . Ace Inhibitors   . Lisinopril     Affects kidney  . Nsaids   . Wellbutrin [Bupropion]     confusion    Medications:  I have reviewed the patient's current medications. Prior to Admission:  Prescriptions prior to admission  Medication Sig Dispense Refill Last Dose  . carbidopa-levodopa (SINEMET IR) 25-250 MG tablet Take 1  tablet by mouth 3 (three) times daily. 270 tablet 1 Taking  . Cholecalciferol (VITAMIN D3) 5000 UNITS CAPS Take by mouth daily.     Taking  . clopidogrel (PLAVIX) 75 MG tablet TAKE 1 TABLET DAILY TO     PREVENT STROKE 90 tablet 1 Taking  . Fish Oil OIL Take by mouth daily. 1 tablespoon    Taking  . ketoconazole (NIZORAL) 2 % shampoo    Taking  . LOCOID 0.1 % LOTN    Taking  . losartan (COZAAR) 100 MG tablet Take 1/2 to 1 tablet daily for BP as directed 30 tablet 99 Taking  . metoprolol succinate (TOPROL-XL) 25 MG 24 hr tablet 1/2-1 pill at night for blood pressure 90 tablet 1 Taking  . multivitamin (THERAGRAN) per tablet Take 1 tablet by mouth daily.     Taking  . nitroGLYCERIN (NITROSTAT) 0.4 MG SL tablet Place 1 tablet (0.4 mg total) under the tongue every 5 (five) minutes as needed for chest pain. One tablet under tongue at onset of chest pain; may repeat every 5 minutes for up to 3 doses 30 tablet PRN Taking  . vitamin C (ASCORBIC ACID) 500 MG tablet Take 500 mg by mouth daily.   Taking   Scheduled: . carbidopa-levodopa  1 tablet Oral TID  .  clopidogrel  75 mg Oral Daily  . docusate sodium  100 mg Oral BID  . famotidine  20 mg Oral QHS  . heparin  5,000 Units Subcutaneous 3 times per day  . labetalol  100 mg Oral BID  . multivitamin with minerals  1 tablet Oral Q supper    ROS: History obtained from the patient  General ROS: negative for - chills, fatigue, fever, night sweats, weight gain or weight loss Psychological ROS: negative for - behavioral disorder, hallucinations, memory difficulties, mood swings or suicidal ideation Ophthalmic ROS: negative for - blurry vision, double vision, eye pain or loss of vision ENT ROS: difficulty swallowing  Allergy and Immunology ROS: negative for - hives or itchy/watery eyes Hematological and Lymphatic ROS: negative for - bleeding problems, bruising or swollen lymph nodes Endocrine ROS: negative for - galactorrhea, hair pattern changes,  polydipsia/polyuria or temperature intolerance Respiratory ROS: negative for - cough, hemoptysis, shortness of breath or wheezing Cardiovascular ROS: negative for - chest pain, dyspnea on exertion, edema or irregular heartbeat Gastrointestinal ROS: negative for - abdominal pain, diarrhea, hematemesis, nausea/vomiting or stool incontinence Genito-Urinary ROS: negative for - dysuria, hematuria, incontinence or urinary frequency/urgency Musculoskeletal ROS: negative for - joint swelling or muscular weakness Neurological ROS: as noted in HPI Dermatological ROS: negative for rash and skin lesion changes  Physical Examination: Blood pressure 145/64, pulse 96, temperature 98.2 F (36.8 C), temperature source Oral, resp. rate 18, height 5\' 8"  (1.727 m), weight 76.295 kg (168 lb 3.2 oz), SpO2 96 %.  HEENT-  Normocephalic, no lesions, without obvious abnormality.  Normal external eye and conjunctiva.  Normal TM's bilaterally.  Normal auditory canals and external ears. Normal external nose, mucus membranes and septum.  Normal pharynx. Cardiovascular- S1, S2 normal, pulses palpable throughout   Lungs- chest clear, no wheezing, rales, normal symmetric air entry Abdomen- soft, non-tender; bowel sounds normal; no masses,  no organomegaly Extremities- no edema Lymph-no adenopathy palpable Musculoskeletal-no joint tenderness, deformity or swelling Skin-warm and dry, no hyperpigmentation, vitiligo, or suspicious lesions  Neurological Examination Mental Status: Alert, oriented, thought content appropriate.  Speech fluent without evidence of aphasia.  Able to follow 3 step commands without difficulty. Cranial Nerves: II: Discs flat bilaterally; Visual fields grossly normal, pupils equal, round, reactive to light and accommodation III,IV, VI: ptosis not present, extra-ocular motions intact bilaterally V,VII: decrease in the right NLF, facial light touch sensation normal bilaterally VIII: hearing normal  bilaterally IX,X: gag reflex present XI: bilateral shoulder shrug XII: midline tongue extension Motor: Right : Upper extremity   5/5    Left:     Upper extremity   5/5  Lower extremity   5/5     Lower extremity   5/5 Increased tone in the BUE's, right greater than left.  Pill-rolling tremor noted in both upper extremities right greater than left  Sensory: Pinprick and light touch intact throughout, bilaterally Deep Tendon Reflexes: 2+ and symmetric with absent AJ's bilaterally Plantars: Right: mute   Left: mute Cerebellar: Normal finger-to-nose and normal heel-to-shin testing bilaterally Gait: needs help to get to a sitting position and stand.  Ambulates with a wide based gait, shuffling.     Laboratory Studies:   Basic Metabolic Panel:  Recent Labs Lab 09/09/15 1651 09/12/15 2041 09/13/15 0503  NA 136 135 141  K 4.9 4.7 4.3  CL 101 103 111  CO2 23 23 22   GLUCOSE 111* 108* 112*  BUN 54* 47* 42*  CREATININE 3.06* 2.52* 2.33*  CALCIUM 9.6 9.3  8.8*  MG 2.5  --   --     Liver Function Tests:  Recent Labs Lab 09/09/15 1651 09/12/15 2041 09/13/15 0503  AST 19 29 25   ALT 5* 18 19  ALKPHOS 58 55 49  BILITOT 0.3 0.7 0.7  PROT 7.2 7.7 6.5  ALBUMIN 4.2 4.1 3.5    Recent Labs Lab 09/12/15 2041  LIPASE 32   No results for input(s): AMMONIA in the last 168 hours.  CBC:  Recent Labs Lab 09/09/15 1651 09/12/15 2041 09/13/15 0503  WBC 8.3 10.2 10.9*  NEUTROABS 5.9 8.0*  --   HGB 13.6 13.7 12.6*  HCT 41.3 41.2 37.2*  MCV 89.2 87.3 89.3  PLT 258 247 220    Cardiac Enzymes:  Recent Labs Lab 09/09/15 1651 09/12/15 2041  CKTOTAL 73  --   TROPONINI  --  0.05*    BNP: Invalid input(s): POCBNP  CBG:  Recent Labs Lab 09/13/15 0748  GLUCAP 115*    Microbiology: Results for orders placed or performed in visit on 09/09/15  Urine culture     Status: None   Collection Time: 09/09/15  4:49 PM  Result Value Ref Range Status   Colony Count 10,000  COLONIES/ML  Final   Organism ID, Bacteria Multiple bacterial morphotypes present, none  Final   Organism ID, Bacteria predominant. Suggest appropriate recollection if   Final   Organism ID, Bacteria clinically indicated.  Final    Coagulation Studies: No results for input(s): LABPROT, INR in the last 72 hours.  Urinalysis:  Recent Labs Lab 09/09/15 1651 09/12/15 2041  COLORURINE YELLOW STRAW*  LABSPEC 1.019 1.004*  PHURINE 6.0 6.0  GLUCOSEU NEGATIVE NEGATIVE  HGBUR NEGATIVE 1+*  BILIRUBINUR NEGATIVE NEGATIVE  KETONESUR NEGATIVE NEGATIVE  PROTEINUR 1+* 100*  NITRITE NEGATIVE NEGATIVE  LEUKOCYTESUR NEGATIVE NEGATIVE    Lipid Panel:     Component Value Date/Time   CHOL 193 07/07/2015 1152   TRIG 285* 07/07/2015 1152   HDL 29* 07/07/2015 1152   CHOLHDL 6.7* 07/07/2015 1152   VLDL 57* 07/07/2015 1152   LDLCALC 107 07/07/2015 1152    HgbA1C:  Lab Results  Component Value Date   HGBA1C 6.1* 07/07/2015    Urine Drug Screen:  No results found for: LABOPIA, COCAINSCRNUR, LABBENZ, AMPHETMU, THCU, LABBARB  Alcohol Level:  Recent Labs Lab 09/12/15 2041  [facility] <5    Imaging: Dg Chest 1 View  09/12/2015  CLINICAL DATA:  Weakness and hypertension. Cough. Dizziness. Ataxia. EXAM: CHEST 1 VIEW COMPARISON:  05/17/2015 FINDINGS: Shallow inspiration with atelectasis in the lung bases. Heart size and pulmonary vascularity are normal for technique. No focal consolidation. No blunting of costophrenic angles. No pneumothorax. Tortuous aorta. Degenerative changes in the spine. Appearance of the chest is similar to previous study. IMPRESSION: Shallow inspiration with probable atelectasis in the lung bases. Electronically Signed   By: Lucienne Capers M.D.   On: 09/12/2015 21:39   Ct Head Wo Contrast  09/12/2015  CLINICAL DATA:  Hypertension, dizziness, and difficulty walking for 3 days. Difficulty keeping balance and falling backwards. History of CVA with residual left-sided deficit.  EXAM: CT HEAD WITHOUT CONTRAST TECHNIQUE: Contiguous axial images were obtained from the base of the skull through the vertex without intravenous contrast. COMPARISON:  05/19/2015 FINDINGS: Diffuse cerebral atrophy. Ventricular dilatation consistent with central atrophy. Low-attenuation changes in the deep white matter consistent with small vessel ischemia. Focal area of mild encephalomalacia in the left insular region is unchanged since previous study. Old right cerebellar  infarct. Focal calcification adjacent to the right side of the falx is unchanged since previous study and possibly represents a meningioma or dystrophic calcification. Gray-white matter junctions are mostly distinct. Basal cisterns are not effaced. No abnormal extra-axial fluid collections. No mass effect or midline shift. No evidence of acute intracranial hemorrhage. Calvarium appears intact. Vascular calcifications. Mild mucosal thickening in the paranasal sinuses. No acute air-fluid levels. IMPRESSION: No acute intracranial abnormalities are demonstrated. Unchanged chronic findings as discussed, including atrophy with small vessel ischemic change, old left insular and old right cerebellar infarcts, and calcification along the right side of the falx. Electronically Signed   By: Lucienne Capers M.D.   On: 09/12/2015 22:12   Mr Brain Wo Contrast  09/13/2015  CLINICAL DATA:  Ataxia.  Parkinson's. EXAM: MRI HEAD WITHOUT CONTRAST TECHNIQUE: Multiplanar, multiecho pulse sequences of the brain and surrounding structures were obtained without intravenous contrast. COMPARISON:  CT head 09/12/2015, MRI 11/03/2014 FINDINGS: Subcentimeter focus of hyperintensity diffusion-weighted imaging in the right parietal lobe. It is difficult to determine if this is acute infarct versus T2 shine through. Correlate with neurologic exam. No other areas of acute infarct. Moderate to advanced atrophy. Extensive chronic microvascular ischemic change throughout the  white matter. Chronic infarct in the cerebellum bilaterally right greater than left. Brainstem intact. Negative for intracranial hemorrhage Calcified extradural mass right parafalcine region unchanged from prior studies and compatible with calcified meningioma. No fluid collection or shift of the midline structures. Pituitary not enlarged. No skull base lesion. Paranasal sinuses clear. IMPRESSION: Possible subcentimeter acute infarct in the right parietal lobe. Alternately, this could represent old infarct with T2 shine through Advanced atrophy.  Extensive chronic ischemic change. Right parafalcine calcified meningioma unchanged. Electronically Signed   By: Franchot Gallo M.D.   On: 09/13/2015 09:59     Assessment/Plan: 82 year male presenting with difficulty with gait.  On yesterday was unable to even move his legs across the bed.  Patient dehydrated with renal insufficiency noted.  This may very well have affected his ambulation.  Improved today although not completely back to his baseline.  His recent baseline though is quite impaired.  There may be some influence from his PD but would not change his medications at this time since he is improving with hydration.  This should likely be addressed as an outpatient.   MRI of the brain personally reviewed and shows an acute infarct in the right parietal lobe.  This may be affecting his gait somewhat as well.    Recommendations: 1.  Agree with addressing hydration and renal insufficiency 2.  PT consult, OT consult, Speech consult 3. HgbA1c, fasting lipid panel 4. Echocardiogram 5. Carotid dopplers 6. Prophylactic therapy-Antiplatelet med: Plavix - dose 75mg  daily 7. NPO until RN stroke swallow screen 8. Telemetry monitoring 9. Frequent neuro checks   Alexis Goodell, MD Neurology [phone] 09/13/2015, 1:13 PM

## 2015-09-13 NOTE — Clinical Social Work Placement (Signed)
   CLINICAL SOCIAL WORK PLACEMENT  NOTE  Date:  09/13/2015  Patient Details  Name: Gary Ortega MRN: [id number] Date of Birth: June 22, 1925  Clinical Social Work is seeking post-discharge placement for this patient at the [place] level of care (*CSW will initial, date and re-position this form in  chart as items are completed):  Yes   Patient/family provided with [place] Work Department's list of facilities offering this level of care within the geographic area requested by the patient (or if unable, by the patient's family).  Yes   Patient/family informed of their freedom to choose among providers that offer the needed level of care, that participate in Medicare, Medicaid or managed care program needed by the patient, have an available bed and are willing to accept the patient.  Yes   Patient/family informed of [facility]'s ownership interest in [facility] and [facility], as well as of the fact that they are under no obligation to receive care at these facilities.  PASRR submitted to EDS on 09/13/15     PASRR number received on 09/13/15     Existing PASRR number confirmed on       FL2 transmitted to all facilities in geographic area requested by pt/family on 09/13/15     FL2 transmitted to all facilities within larger geographic area on       Patient informed that his/her managed care company has contracts with or will negotiate with certain facilities, including the following:            Patient/family informed of bed offers received.  Patient chooses bed at       Physician recommends and patient chooses bed at      Patient to be transferred to   on  .  Patient to be transferred to facility by       Patient family notified on   of transfer.  Name of family member notified:        PHYSICIAN       Additional Comment:    _______________________________________________ Loralyn Freshwater, LCSW 09/13/2015, 2:01 PM

## 2015-09-14 ENCOUNTER — Inpatient Hospital Stay (HOSPITAL_COMMUNITY)
Admit: 2015-09-14 | Discharge: 2015-09-14 | Disposition: A | Discharge status: Home / Self Care | Payer: Medicare Other | Attending: Neurology | Admitting: Neurology

## 2015-09-14 DIAGNOSIS — I10 Essential (primary) hypertension: Secondary | ICD-10-CM

## 2015-09-14 LAB — CBC
HEMATOCRIT: 35.6 % — AB (ref 40.0–52.0)
Hemoglobin: 11.9 g/dL — ABNORMAL LOW (ref 13.0–18.0)
MCH: 29.7 pg (ref 26.0–34.0)
MCHC: 33.3 g/dL (ref 32.0–36.0)
MCV: 89.1 fL (ref 80.0–100.0)
Platelets: 207 10*3/uL (ref 150–440)
RBC: 3.99 MIL/uL — AB (ref 4.40–5.90)
RDW: 14.5 % (ref 11.5–14.5)
WBC: 9.7 10*3/uL (ref 3.8–10.6)

## 2015-09-14 LAB — BASIC METABOLIC PANEL
Anion gap: 8 (ref 5–15)
BUN: 40 mg/dL — ABNORMAL HIGH (ref 6–20)
CHLORIDE: 113 mmol/L — AB (ref 101–111)
CO2: 19 mmol/L — ABNORMAL LOW (ref 22–32)
Calcium: 8.2 mg/dL — ABNORMAL LOW (ref 8.9–10.3)
Creatinine, Ser: 2.29 mg/dL — ABNORMAL HIGH (ref 0.61–1.24)
GFR calc non Af Amer: 24 mL/min — ABNORMAL LOW (ref >60)
GFR, EST AFRICAN AMERICAN: 28 mL/min — AB (ref >60)
Glucose, Bld: 98 mg/dL (ref 65–99)
POTASSIUM: 4.1 mmol/L (ref 3.5–5.1)
SODIUM: 140 mmol/L (ref 135–145)

## 2015-09-14 LAB — LIPID PANEL
CHOLESTEROL: 177 mg/dL (ref 0–200)
HDL: 30 mg/dL — ABNORMAL LOW (ref >40)
LDL Cholesterol: 112 mg/dL — ABNORMAL HIGH (ref 0–99)
Total CHOL/HDL Ratio: 5.9 RATIO
Triglycerides: 175 mg/dL — ABNORMAL HIGH (ref <150)
VLDL: 35 mg/dL (ref 0–40)

## 2015-09-14 LAB — GLUCOSE, CAPILLARY
Glucose-Capillary: 87 mg/dL (ref 65–99)
Glucose-Capillary: 100 mg/dL — ABNORMAL HIGH (ref 65–99)

## 2015-09-14 LAB — TROPONIN I: TROPONIN I: 0.06 ng/mL — AB (ref <0.031)

## 2015-09-14 LAB — HEMOGLOBIN A1C: Hgb A1c MFr Bld: 5.3 % (ref 4.0–6.0)

## 2015-09-14 MED ORDER — POLYETHYLENE GLYCOL 3350 17 G PO PACK
17.0000 g | PACK | Freq: Every day | ORAL | Status: DC
  Administered 2015-09-14 – 2015-09-15 (×2): 17 g via ORAL
  Filled 2015-09-14 (×3): qty 1

## 2015-09-14 MED ORDER — ENSURE ENLIVE PO LIQD
237.0000 mL | Freq: Two times a day (BID) | ORAL | Status: DC
  Administered 2015-09-14 – 2015-09-15 (×2): 237 mL via ORAL

## 2015-09-14 NOTE — Consult Note (Signed)
Reason for Consult:*Borderline troponins and weakness Referring Physician: Dr. Sparks hospitalist  Gary Ortega is an 82 y.o. male.  HPI: Patient's a 82-year-old male with significant Parkinson's disease history of CVA complained of progressive weakness and ataxia is any significant falls. Patient states he was at home and wasn't able to get up and walk denies any trauma denies any fever chills or sweats he has a history of chronic renal insufficiency and appears to be slightly dehydrated. Workup in the ER including CT of the head was unremarkable he denied any chest pain no focal weakness  Past Medical History  Diagnosis Date  . BPH (benign prostatic hypertrophy) with urinary obstruction   . CVA (cerebral infarction)   . Hypertension   . Parkinson disease (HCC)   . Renal disorder   . Stroke (HCC)   . Cancer (HCC)     Past Surgical History  Procedure Laterality Date  . Colonoscopy    . Cystoscopy      prostate radioactive I-125 seed impklantation   . Mandible fracture surgery  1948  . Appendectomy    . Hemorrhoid surgery    . Cataract extraction    . Total nephrectomy Right 2012    Family History  Problem Relation Age of Onset  . Stroke Mother   . Hypertension Mother   . Stroke Father   . Hypertension Brother     Social History:  reports that he has never smoked. He does not have any smokeless tobacco history on file. He reports that he does not drink alcohol. His drug history is not on file.  Allergies:  Allergies  Allergen Reactions  . Ace Inhibitors   . Lisinopril     Affects kidney  . Nsaids   . Wellbutrin [Bupropion]     confusion    Medications: I have reviewed the patient's current medications.  Results for orders placed or performed during the hospital encounter of 09/12/15 (from the past 48 hour(s))  Comprehensive metabolic panel     Status: Abnormal   Collection Time: 09/12/15  8:41 PM  Result Value Ref Range   Sodium 135 135 - 145 mmol/L    Potassium 4.7 3.5 - 5.1 mmol/L   Chloride 103 101 - 111 mmol/L   CO2 23 22 - 32 mmol/L   Glucose, Bld 108 (H) 65 - 99 mg/dL   BUN 47 (H) 6 - 20 mg/dL   Creatinine, Ser 2.52 (H) 0.61 - 1.24 mg/dL   Calcium 9.3 8.9 - 10.3 mg/dL   Total Protein 7.7 6.5 - 8.1 g/dL   Albumin 4.1 3.5 - 5.0 g/dL   AST 29 15 - 41 U/L   ALT 18 17 - 63 U/L   Alkaline Phosphatase 55 38 - 126 U/L   Total Bilirubin 0.7 0.3 - 1.2 mg/dL   GFR calc non Af Amer 21 (L) >60 mL/min   GFR calc Af Amer 25 (L) >60 mL/min    Comment: (NOTE) The eGFR has been calculated using the CKD EPI equation. This calculation has not been validated in all clinical situations. eGFR's persistently <60 mL/min signify possible Chronic Kidney Disease.    Anion gap 9 5 - 15  Ethanol     Status: None   Collection Time: 09/12/15  8:41 PM  Result Value Ref Range   Alcohol, Ethyl (B) <5 <5 mg/dL    Comment:        LOWEST DETECTABLE LIMIT FOR SERUM ALCOHOL IS 5 mg/dL FOR MEDICAL PURPOSES ONLY     Lipase, blood     Status: None   Collection Time: 09/12/15  8:41 PM  Result Value Ref Range   Lipase 32 11 - 51 U/L  Troponin I     Status: Abnormal   Collection Time: 09/12/15  8:41 PM  Result Value Ref Range   Troponin I 0.05 (H) <0.031 ng/mL    Comment: READ BACK AND VERIFIED WITH SUSAN NEAL AT 2143 ON 09/12/15 [facility]        PERSISTENTLY INCREASED TROPONIN VALUES IN THE RANGE OF 0.04-0.49 ng/mL CAN BE SEEN IN:       -UNSTABLE ANGINA       -CONGESTIVE HEART FAILURE       -MYOCARDITIS       -CHEST TRAUMA       -ARRYHTHMIAS       -LATE PRESENTING MYOCARDIAL INFARCTION       -COPD   CLINICAL FOLLOW-UP RECOMMENDED.   CBC with Differential     Status: Abnormal   Collection Time: 09/12/15  8:41 PM  Result Value Ref Range   WBC 10.2 3.8 - 10.6 K/uL   RBC 4.72 4.40 - 5.90 MIL/uL   Hemoglobin 13.7 13.0 - 18.0 g/dL   HCT 41.2 40.0 - 52.0 %   MCV 87.3 80.0 - 100.0 fL   MCH 29.1 26.0 - 34.0 pg   MCHC 33.4 32.0 - 36.0 g/dL   RDW 14.2 11.5 -  14.5 %   Platelets 247 150 - 440 K/uL   Neutrophils Relative % 79 %   Neutro Abs 8.0 (H) 1.4 - 6.5 K/uL   Lymphocytes Relative 9 %   Lymphs Abs 1.0 1.0 - 3.6 K/uL   Monocytes Relative 8 %   Monocytes Absolute 0.9 0.2 - 1.0 K/uL   Eosinophils Relative 3 %   Eosinophils Absolute 0.3 0 - 0.7 K/uL   Basophils Relative 1 %   Basophils Absolute 0.1 0 - 0.1 K/uL  Urinalysis complete, with microscopic     Status: Abnormal   Collection Time: 09/12/15  8:41 PM  Result Value Ref Range   Color, Urine STRAW (A) YELLOW   APPearance CLEAR (A) CLEAR   Glucose, UA NEGATIVE NEGATIVE mg/dL   Bilirubin Urine NEGATIVE NEGATIVE   Ketones, ur NEGATIVE NEGATIVE mg/dL   Specific Gravity, Urine 1.004 (L) 1.005 - 1.030   Hgb urine dipstick 1+ (A) NEGATIVE   pH 6.0 5.0 - 8.0   Protein, ur 100 (A) NEGATIVE mg/dL   Nitrite NEGATIVE NEGATIVE   Leukocytes, UA NEGATIVE NEGATIVE   RBC / HPF 0-5 0 - 5 RBC/hpf   WBC, UA 0-5 0 - 5 WBC/hpf   Bacteria, UA RARE (A) NONE SEEN   Squamous Epithelial / LPF NONE SEEN NONE SEEN  CBC     Status: Abnormal   Collection Time: 09/13/15  5:03 AM  Result Value Ref Range   WBC 10.9 (H) 3.8 - 10.6 K/uL   RBC 4.17 (L) 4.40 - 5.90 MIL/uL   Hemoglobin 12.6 (L) 13.0 - 18.0 g/dL   HCT 37.2 (L) 40.0 - 52.0 %   MCV 89.3 80.0 - 100.0 fL   MCH 30.3 26.0 - 34.0 pg   MCHC 33.9 32.0 - 36.0 g/dL   RDW 14.5 11.5 - 14.5 %   Platelets 220 150 - 440 K/uL  Comprehensive metabolic panel     Status: Abnormal   Collection Time: 09/13/15  5:03 AM  Result Value Ref Range   Sodium 141 135 - 145 mmol/L   Potassium  4.3 3.5 - 5.1 mmol/L   Chloride 111 101 - 111 mmol/L   CO2 22 22 - 32 mmol/L   Glucose, Bld 112 (H) 65 - 99 mg/dL   BUN 42 (H) 6 - 20 mg/dL   Creatinine, Ser 2.33 (H) 0.61 - 1.24 mg/dL   Calcium 8.8 (L) 8.9 - 10.3 mg/dL   Total Protein 6.5 6.5 - 8.1 g/dL   Albumin 3.5 3.5 - 5.0 g/dL   AST 25 15 - 41 U/L   ALT 19 17 - 63 U/L   Alkaline Phosphatase 49 38 - 126 U/L   Total  Bilirubin 0.7 0.3 - 1.2 mg/dL   GFR calc non Af Amer 23 (L) >60 mL/min   GFR calc Af Amer 27 (L) >60 mL/min    Comment: (NOTE) The eGFR has been calculated using the CKD EPI equation. This calculation has not been validated in all clinical situations. eGFR's persistently <60 mL/min signify possible Chronic Kidney Disease.    Anion gap 8 5 - 15  Glucose, capillary     Status: Abnormal   Collection Time: 09/13/15  7:48 AM  Result Value Ref Range   Glucose-Capillary 115 (H) 65 - 99 mg/dL   Comment 1 Notify RN   Troponin I     Status: Abnormal   Collection Time: 09/13/15  6:02 PM  Result Value Ref Range   Troponin I 0.04 (H) <0.031 ng/mL    Comment: PREVIOUS RESULT CALLED BY RWW @ 2143 ON 09/12/2015 [facility]        PERSISTENTLY INCREASED TROPONIN VALUES IN THE RANGE OF 0.04-0.49 ng/mL CAN BE SEEN IN:       -UNSTABLE ANGINA       -CONGESTIVE HEART FAILURE       -MYOCARDITIS       -CHEST TRAUMA       -ARRYHTHMIAS       -LATE PRESENTING MYOCARDIAL INFARCTION       -COPD   CLINICAL FOLLOW-UP RECOMMENDED.   Troponin I     Status: Abnormal   Collection Time: 09/13/15 10:28 PM  Result Value Ref Range   Troponin I 0.07 (H) <0.031 ng/mL    Comment: PREVIOUS RESULT CALLED SUSAN NEAL AT 2143 ON 09/12/15 BY [facility]. VAB        PERSISTENTLY INCREASED TROPONIN VALUES IN THE RANGE OF 0.04-0.49 ng/mL CAN BE SEEN IN:       -UNSTABLE ANGINA       -CONGESTIVE HEART FAILURE       -MYOCARDITIS       -CHEST TRAUMA       -ARRYHTHMIAS       -LATE PRESENTING MYOCARDIAL INFARCTION       -COPD   CLINICAL FOLLOW-UP RECOMMENDED.   Troponin I     Status: Abnormal   Collection Time: 09/14/15  2:28 AM  Result Value Ref Range   Troponin I 0.06 (H) <0.031 ng/mL    Comment: PREVIOUS RESULT CALLED SUSAN NEAL AT 2143 ON 09/12/15 BY [facility]. VAB        PERSISTENTLY INCREASED TROPONIN VALUES IN THE RANGE OF 0.04-0.49 ng/mL CAN BE SEEN IN:       -UNSTABLE ANGINA       -CONGESTIVE HEART FAILURE        -MYOCARDITIS       -CHEST TRAUMA       -ARRYHTHMIAS       -LATE PRESENTING MYOCARDIAL INFARCTION       -COPD   CLINICAL FOLLOW-UP RECOMMENDED.   Lipid  panel     Status: Abnormal   Collection Time: 09/14/15  2:28 AM  Result Value Ref Range   Cholesterol 177 0 - 200 mg/dL   Triglycerides 175 (H) <150 mg/dL   HDL 30 (L) >40 mg/dL   Total CHOL/HDL Ratio 5.9 RATIO   VLDL 35 0 - 40 mg/dL   LDL Cholesterol 112 (H) 0 - 99 mg/dL    Comment:        Total Cholesterol/HDL:CHD Risk Coronary Heart Disease Risk Table                     Men   Women  1/2 Average Risk   3.4   3.3  Average Risk       5.0   4.4  2 X Average Risk   9.6   7.1  3 X Average Risk  23.4   11.0        Use the calculated Patient Ratio above and the CHD Risk Table to determine the patient's CHD Risk.        ATP III CLASSIFICATION (LDL):  <100     mg/dL   Optimal  100-129  mg/dL   Near or Above                    Optimal  130-159  mg/dL   Borderline  160-189  mg/dL   High  >190     mg/dL   Very High   Basic metabolic panel     Status: Abnormal   Collection Time: 09/14/15  2:28 AM  Result Value Ref Range   Sodium 140 135 - 145 mmol/L   Potassium 4.1 3.5 - 5.1 mmol/L   Chloride 113 (H) 101 - 111 mmol/L   CO2 19 (L) 22 - 32 mmol/L   Glucose, Bld 98 65 - 99 mg/dL   BUN 40 (H) 6 - 20 mg/dL   Creatinine, Ser 2.29 (H) 0.61 - 1.24 mg/dL   Calcium 8.2 (L) 8.9 - 10.3 mg/dL   GFR calc non Af Amer 24 (L) >60 mL/min   GFR calc Af Amer 28 (L) >60 mL/min    Comment: (NOTE) The eGFR has been calculated using the CKD EPI equation. This calculation has not been validated in all clinical situations. eGFR's persistently <60 mL/min signify possible Chronic Kidney Disease.    Anion gap 8 5 - 15  CBC     Status: Abnormal   Collection Time: 09/14/15  2:28 AM  Result Value Ref Range   WBC 9.7 3.8 - 10.6 K/uL   RBC 3.99 (L) 4.40 - 5.90 MIL/uL   Hemoglobin 11.9 (L) 13.0 - 18.0 g/dL   HCT 35.6 (L) 40.0 - 52.0 %   MCV 89.1  80.0 - 100.0 fL   MCH 29.7 26.0 - 34.0 pg   MCHC 33.3 32.0 - 36.0 g/dL   RDW 14.5 11.5 - 14.5 %   Platelets 207 150 - 440 K/uL  Glucose, capillary     Status: None   Collection Time: 09/14/15  8:32 AM  Result Value Ref Range   Glucose-Capillary 87 65 - 99 mg/dL    Dg Chest 1 View  09/12/2015  CLINICAL DATA:  Weakness and hypertension. Cough. Dizziness. Ataxia. EXAM: CHEST 1 VIEW COMPARISON:  05/17/2015 FINDINGS: Shallow inspiration with atelectasis in the lung bases. Heart size and pulmonary vascularity are normal for technique. No focal consolidation. No blunting of costophrenic angles. No pneumothorax. Tortuous aorta. Degenerative changes in the spine.   Appearance of the chest is similar to previous study. IMPRESSION: Shallow inspiration with probable atelectasis in the lung bases. Electronically Signed   By: Lucienne Capers M.D.   On: 09/12/2015 21:39   Ct Head Wo Contrast  09/12/2015  CLINICAL DATA:  Hypertension, dizziness, and difficulty walking for 3 days. Difficulty keeping balance and falling backwards. History of CVA with residual left-sided deficit. EXAM: CT HEAD WITHOUT CONTRAST TECHNIQUE: Contiguous axial images were obtained from the base of the skull through the vertex without intravenous contrast. COMPARISON:  05/19/2015 FINDINGS: Diffuse cerebral atrophy. Ventricular dilatation consistent with central atrophy. Low-attenuation changes in the deep white matter consistent with small vessel ischemia. Focal area of mild encephalomalacia in the left insular region is unchanged since previous study. Old right cerebellar infarct. Focal calcification adjacent to the right side of the falx is unchanged since previous study and possibly represents a meningioma or dystrophic calcification. Gray-white matter junctions are mostly distinct. Basal cisterns are not effaced. No abnormal extra-axial fluid collections. No mass effect or midline shift. No evidence of acute intracranial hemorrhage.  Calvarium appears intact. Vascular calcifications. Mild mucosal thickening in the paranasal sinuses. No acute air-fluid levels. IMPRESSION: No acute intracranial abnormalities are demonstrated. Unchanged chronic findings as discussed, including atrophy with small vessel ischemic change, old left insular and old right cerebellar infarcts, and calcification along the right side of the falx. Electronically Signed   By: Lucienne Capers M.D.   On: 09/12/2015 22:12   Mr Brain Wo Contrast  09/13/2015  CLINICAL DATA:  Ataxia.  Parkinson's. EXAM: MRI HEAD WITHOUT CONTRAST TECHNIQUE: Multiplanar, multiecho pulse sequences of the brain and surrounding structures were obtained without intravenous contrast. COMPARISON:  CT head 09/12/2015, MRI 11/03/2014 FINDINGS: Subcentimeter focus of hyperintensity diffusion-weighted imaging in the right parietal lobe. It is difficult to determine if this is acute infarct versus T2 shine through. Correlate with neurologic exam. No other areas of acute infarct. Moderate to advanced atrophy. Extensive chronic microvascular ischemic change throughout the white matter. Chronic infarct in the cerebellum bilaterally right greater than left. Brainstem intact. Negative for intracranial hemorrhage Calcified extradural mass right parafalcine region unchanged from prior studies and compatible with calcified meningioma. No fluid collection or shift of the midline structures. Pituitary not enlarged. No skull base lesion. Paranasal sinuses clear. IMPRESSION: Possible subcentimeter acute infarct in the right parietal lobe. Alternately, this could represent old infarct with T2 shine through Advanced atrophy.  Extensive chronic ischemic change. Right parafalcine calcified meningioma unchanged. Electronically Signed   By: Franchot Gallo M.D.   On: 09/13/2015 09:59   US Renal  09/13/2015  CLINICAL DATA:  Acute renal failure. History of prostate cancer. History of nephrectomy. EXAM: RENAL / URINARY TRACT  ULTRASOUND COMPLETE COMPARISON:  CT of the abdomen and pelvis 05/17/2015 FINDINGS: Right Kidney: Length: Surgically absent. Left Kidney: Length: 11.9 cm. Midpole cyst is 1.7 x 1.5 x 1.3 cm. Lower pole cyst is 2.1 x 2.0 x 2.1 cm. Bladder: Appears normal for degree of bladder distention. Left ureteral jet is normal in appearance. IMPRESSION: 1. Status post right nephrectomy. 2. Left renal cysts.  No hydronephrosis. Electronically Signed   By: Nolon Nations M.D.   On: 09/13/2015 17:49   US Carotid Bilateral  09/13/2015  CLINICAL DATA:  Stroke. EXAM: BILATERAL CAROTID DUPLEX ULTRASOUND TECHNIQUE: Pearline Cables scale imaging, color Doppler and duplex ultrasound were performed of bilateral carotid and vertebral arteries in the neck. COMPARISON:  CT and MRI 09/13/2015 FINDINGS: Criteria: Quantification of carotid stenosis is based on  velocity parameters that correlate the residual internal carotid diameter with NASCET-based stenosis levels, using the diameter of the distal internal carotid lumen as the denominator for stenosis measurement. The following velocity measurements were obtained: RIGHT ICA:  79 cm/sec CCA:  98 cm/sec SYSTOLIC ICA/CCA RATIO:  0.8 DIASTOLIC ICA/CCA RATIO:  1.8 ECA:  180 cm/sec LEFT ICA:  150 cm/sec CCA:  106 cm/sec SYSTOLIC ICA/CCA RATIO:  1.4 DIASTOLIC ICA/CCA RATIO:  1.9 ECA:  217 cm/sec RIGHT CAROTID ARTERY: Mild calcified plaque at the right carotid bulb. No significant plaque within the internal carotid artery. Normal Doppler waveforms. RIGHT VERTEBRAL ARTERY:  Normal antegrade flow. LEFT CAROTID ARTERY: Moderate homogeneous soft plaque at the left carotid bulb and origin of the left internal carotid artery. Normal Doppler waveforms. LEFT VERTEBRAL ARTERY:  Normal antegrade flow. IMPRESSION: Moderate plaque at the left carotid bulb and proximal internal carotid artery. Velocity measurements compatible with 50-69% stenosis of the proximal left internal carotid artery. No significant plaque or  hemodynamically significant stenosis of the right internal carotid artery. Normal antegrade flow within the vertebral arteries. Electronically Signed   By: Daniel  Boyle M.D.   On: 09/13/2015 17:54    Review of Systems  Constitutional: Positive for malaise/fatigue.  HENT: Negative.   Eyes: Negative.   Respiratory: Negative.   Cardiovascular: Negative.   Gastrointestinal: Negative.   Genitourinary: Negative.   Musculoskeletal: Positive for myalgias, back pain and joint pain.  Skin: Negative.   Neurological: Positive for tremors and weakness.       Generalized weakness  Endo/Heme/Allergies: Negative.   Psychiatric/Behavioral: Positive for memory loss.   Blood pressure 121/53, pulse 71, temperature 97.5 F (36.4 C), temperature source Oral, resp. rate 19, height 5' 8" (1.727 m), weight 78.155 kg (172 lb 4.8 oz), SpO2 100 %. Physical Exam  Assessment/Plan: Generalized weakness Parkinson's disease Hyperlipidemia Dehydration Acute on chronic renal insufficiency Ataxia History of TIA Hypertension Borderline troponins . PLAN Agree with admission placed on gentle hydration Continue DVT prophylaxis Agree with blood pressure control with labetalol Continue Sinemet for Parkinson's disease Currently on aspirin Plavix for history of TIA Reflux symptoms controlled with Pepcid Continue Lipitor therapy for lipid management Elevated troponins related to renal insufficiency and dehydration Consider evaluation by nephrology because of recurrent chronic renal insufficiency Echocardiogram will be helpful Do not recommend invasive or further significant cardiac workup at this point   CALLWOOD,DWAYNE D. 09/14/2015, 1:21 PM      

## 2015-09-14 NOTE — Plan of Care (Signed)
Problem: SLP Dysphagia Goals Goal: Misc Dysphagia Goal Pt will safely tolerate least restrictive diet without s/s of aspiration while admitted.

## 2015-09-14 NOTE — Progress Notes (Signed)
*  PRELIMINARY RESULTS* Echocardiogram 2D Echocardiogram has been performed.  Gary Ortega 09/14/2015, 8:15 AM

## 2015-09-14 NOTE — Progress Notes (Signed)
Physical Therapy Treatment Patient Details Name: Gary Ortega MRN: [id number] DOB: Jan 05, 1925 Today's Date: 09/14/2015    History of Present Illness Pt admitted with acute renal failure, has been weaker and having more issues getting around.  Wife unable to assist him at home, he has history of Parkinson's and CVA.    PT Comments    Pt was EOB with nursing at beginning of session.  Pt required min guard sit to stand and min assist for ambulation with RW.  Pt ambulated 1x70 feet and after a break 1x30 feet for a total of 100 feet.  Pt complained of dizziness during standing and ambulation that did not get better or worse during activity.  Pt also performed EOB exercises for LE.  D/t assist levels, continue to recommend discharge to STR (pt does not appear safe to discharge home alone).   Follow Up Recommendations  SNF     Equipment Recommendations       Recommendations for Other Services       Precautions / Restrictions Precautions Precautions: Fall Restrictions Weight Bearing Restrictions: No    Mobility  Bed Mobility Overal bed mobility: Needs Assistance Bed Mobility: Sit to Supine      Sit to supine: Min assist      Transfers Overall transfer level: Needs assistance Equipment used: Rolling walker (2 wheeled) Transfers: Sit to/from Stand Sit to Stand: Min guard         General transfer comment: Pt needs assist (vc's for hand placement) to get to standing upright, is able to maintain balance with AD once assisted to standing.  Ambulation/Gait Ambulation/Gait assistance: Min assist Ambulation Distance (Feet):  (1x70; 1x30) Assistive device: Rolling walker (2 wheeled) Gait Pattern/deviations: Narrow base of support;Decreased step length - left;Decreased stance time - left     General Gait Details: Pt with some minimal unsteadiness and hesitancy.     Stairs            Wheelchair Mobility    Modified Rankin (Stroke Patients Only)        Balance Overall balance assessment: Needs assistance Sitting-balance support: Feet supported Sitting balance-Leahy Scale: Good     Standing balance support: Bilateral upper extremity supported (RW) Standing balance-Leahy Scale: Good                      Cognition Arousal/Alertness: Awake/alert Behavior During Therapy: WFL for tasks assessed/performed Overall Cognitive Status: Within Functional Limits for tasks assessed                      Exercises Total Joint Exercises Ankle Circles/Pumps: AROM;Both;20 reps Gluteal Sets: AROM;Both;10 reps Hip ABduction/ADduction: AROM;Both;20 reps Long Arc Quad: AROM;Both;20 reps General Exercises - Lower Extremity Hip Flexion/Marching: AROM;Both;20 reps (sitting EOB) Toe Raises: AROM;Both;10 reps Heel Raises: AROM;Both;10 reps Ex's performed in sitting.   General Comments   Nursing was contacted and cleared pt for physical therapy.  Pt was agreeable and tolerated session well.       Pertinent Vitals/Pain Pain Assessment: No/denies pain  See flow sheet for vitals.     Home Living                      Prior Function            PT Goals (current goals can now be found in the care plan section) Acute Rehab PT Goals Patient Stated Goal: Get stronger PT Goal Formulation: With patient Time For Goal Achievement:  09/27/15 Potential to Achieve Goals: Fair Progress towards PT goals: Progressing toward goals    Frequency  Min 2X/week    PT Plan Current plan remains appropriate    Co-evaluation             End of Session Equipment Utilized During Treatment: Gait belt   Patient left: in bed;with call bell/phone within reach;with bed alarm set     Time: 1440-1504 PT Time Calculation (min) (ACUTE ONLY): 24 min  Charges:                       G Codes:      Mittie Bodo, SPT Mittie Bodo 09/14/2015, 4:22 PM

## 2015-09-14 NOTE — Care Management Important Message (Signed)
Important Message  Patient Details  Name: MYRTLE PITCOCK MRN: [id number] Date of Birth: 1924/12/29   Medicare Important Message Given:  Yes    Juliann Pulse A Anastasiya Gowin 09/14/2015, 2:12 PM

## 2015-09-14 NOTE — Progress Notes (Signed)
CSW presented SNF bed offers to patient.  Patient has selected [name]. Patient asked CSW to call his daughter Rosendo Gros  [phone] to inform her of his selection.  Call to daughter Rosendo Gros, she is in agreement with [name].  Call to [facility] to inform them patient has accepted the bed offer.   CSW will continue to follow patient and assist with ongoing and discharge needs.  Casimer Lanius. [facility] [phone]  2:23 PM

## 2015-09-14 NOTE — Progress Notes (Signed)
[facility] [place] NAME: Gary Ortega    MR#:  [id number]  DATE OF BIRTH:  1924/09/13  SUBJECTIVE:  CHIEF COMPLAINT:   Chief Complaint  Patient presents with  . Dizziness  . Gait Problem  . Hypertension   Patient is 82 year old male with a history significant for history of Parkinson's disease, prior stroke who presents to the hospital with complaints of progressive weakness and ataxia. On arrival to the hospital. He was noted to be in acute renal failure, he was given IV fluids and his condition improved. Patient feels better today, feels stronger. Denies new lateralized weakness or numbness. MRI of brain revealed acute infarct in the right parietal lobe, carotid ultrasound was remarkable for 50-69% stenosis in the proximal left internal carotid artery, antegrade flow in vertebral arteries. Renal ultrasound revealed status post right nephrectomy and left renal cysts, no hydro nephrosis     Review of Systems  Constitutional: Negative for fever, chills and weight loss.  HENT: Negative for congestion.   Eyes: Negative for blurred vision and double vision.  Respiratory: Negative for cough, sputum production, shortness of breath and wheezing.   Cardiovascular: Negative for chest pain, palpitations, orthopnea, leg swelling and PND.  Gastrointestinal: Negative for nausea, vomiting, abdominal pain, diarrhea, constipation and blood in stool.  Genitourinary: Negative for dysuria, urgency, frequency and hematuria.  Musculoskeletal: Negative for falls.  Neurological: Negative for dizziness, tremors, focal weakness and headaches.  Endo/Heme/Allergies: Does not bruise/bleed easily.  Psychiatric/Behavioral: Negative for depression. The patient does not have insomnia.     VITAL SIGNS: Blood pressure 121/53, pulse 71, temperature 97.5 F (36.4 C), temperature source Oral, resp. rate 19, height 5\' 8"  (1.727 m), weight 78.155 kg (172 lb 4.8  oz), SpO2 100 %.  PHYSICAL EXAMINATION:   GENERAL:  82 y.o.-year-old patient lying in the bed with no acute distress.  EYES: Pupils equal, round, reactive to light and accommodation. No scleral icterus. Extraocular muscles intact.  HEENT: Head atraumatic, normocephalic. Oropharynx and nasopharynx clear.  NECK:  Supple, no jugular venous distention. No thyroid enlargement, no tenderness.  LUNGS: Normal breath sounds bilaterally, no wheezing, rales,rhonchi or crepitation. No use of accessory muscles of respiration.  CARDIOVASCULAR: S1, S2 normal. No murmurs, rubs, or gallops.  ABDOMEN: Soft, nontender, nondistended. Bowel sounds present. No organomegaly or mass.  EXTREMITIES: No pedal edema, cyanosis, or clubbing.  NEUROLOGIC: Cranial nerves II through XII are intact. Muscle strength 5/5 in all extremities. Sensation intact. Gait not checked.  PSYCHIATRIC: The patient is alert and oriented x 3.  SKIN: No obvious rash, lesion, or ulcer.   ORDERS/RESULTS REVIEWED:   CBC  Recent Labs Lab 09/09/15 1651 09/12/15 2041 09/13/15 0503 09/14/15 0228  WBC 8.3 10.2 10.9* 9.7  HGB 13.6 13.7 12.6* 11.9*  HCT 41.3 41.2 37.2* 35.6*  PLT 258 247 220 207  MCV 89.2 87.3 89.3 89.1  MCH 29.4 29.1 30.3 29.7  MCHC 32.9 33.4 33.9 33.3  RDW 14.3 14.2 14.5 14.5  LYMPHSABS 1.4 1.0  --   --   MONOABS 0.6 0.9  --   --   EOSABS 0.3 0.3  --   --   BASOSABS 0.1 0.1  --   --    ------------------------------------------------------------------------------------------------------------------  Chemistries   Recent Labs Lab 09/09/15 1651 09/12/15 2041 09/13/15 0503 09/14/15 0228  NA 136 135 141 140  K 4.9 4.7 4.3 4.1  CL 101 103 111 113*  CO2 23 23 22  19*  GLUCOSE  111* 108* 112* 98  BUN 54* 47* 42* 40*  CREATININE 3.06* 2.52* 2.33* 2.29*  CALCIUM 9.6 9.3 8.8* 8.2*  MG 2.5  --   --   --   AST 19 29 25   --   ALT 5* 18 19  --   ALKPHOS 58 55 49  --   BILITOT 0.3 0.7 0.7  --     ------------------------------------------------------------------------------------------------------------------ estimated creatinine clearance is 21.6 mL/min (by C-G formula based on Cr of 2.29). ------------------------------------------------------------------------------------------------------------------ No results for input(s): TSH, T4TOTAL, T3FREE, THYROIDAB in the last 72 hours.  Invalid input(s): FREET3  Cardiac Enzymes  Recent Labs Lab 09/13/15 1802 09/13/15 2228 09/14/15 0228  TROPONINI 0.04* 0.07* 0.06*   ------------------------------------------------------------------------------------------------------------------ Invalid input(s): POCBNP ---------------------------------------------------------------------------------------------------------------  RADIOLOGY: Dg Chest 1 View  09/12/2015  CLINICAL DATA:  Weakness and hypertension. Cough. Dizziness. Ataxia. EXAM: CHEST 1 VIEW COMPARISON:  05/17/2015 FINDINGS: Shallow inspiration with atelectasis in the lung bases. Heart size and pulmonary vascularity are normal for technique. No focal consolidation. No blunting of costophrenic angles. No pneumothorax. Tortuous aorta. Degenerative changes in the spine. Appearance of the chest is similar to previous study. IMPRESSION: Shallow inspiration with probable atelectasis in the lung bases. Electronically Signed   By: Lucienne Capers M.D.   On: 09/12/2015 21:39   Ct Head Wo Contrast  09/12/2015  CLINICAL DATA:  Hypertension, dizziness, and difficulty walking for 3 days. Difficulty keeping balance and falling backwards. History of CVA with residual left-sided deficit. EXAM: CT HEAD WITHOUT CONTRAST TECHNIQUE: Contiguous axial images were obtained from the base of the skull through the vertex without intravenous contrast. COMPARISON:  05/19/2015 FINDINGS: Diffuse cerebral atrophy. Ventricular dilatation consistent with central atrophy. Low-attenuation changes in the deep white  matter consistent with small vessel ischemia. Focal area of mild encephalomalacia in the left insular region is unchanged since previous study. Old right cerebellar infarct. Focal calcification adjacent to the right side of the falx is unchanged since previous study and possibly represents a meningioma or dystrophic calcification. Gray-white matter junctions are mostly distinct. Basal cisterns are not effaced. No abnormal extra-axial fluid collections. No mass effect or midline shift. No evidence of acute intracranial hemorrhage. Calvarium appears intact. Vascular calcifications. Mild mucosal thickening in the paranasal sinuses. No acute air-fluid levels. IMPRESSION: No acute intracranial abnormalities are demonstrated. Unchanged chronic findings as discussed, including atrophy with small vessel ischemic change, old left insular and old right cerebellar infarcts, and calcification along the right side of the falx. Electronically Signed   By: Lucienne Capers M.D.   On: 09/12/2015 22:12   Mr Brain Wo Contrast  09/13/2015  CLINICAL DATA:  Ataxia.  Parkinson's. EXAM: MRI HEAD WITHOUT CONTRAST TECHNIQUE: Multiplanar, multiecho pulse sequences of the brain and surrounding structures were obtained without intravenous contrast. COMPARISON:  CT head 09/12/2015, MRI 11/03/2014 FINDINGS: Subcentimeter focus of hyperintensity diffusion-weighted imaging in the right parietal lobe. It is difficult to determine if this is acute infarct versus T2 shine through. Correlate with neurologic exam. No other areas of acute infarct. Moderate to advanced atrophy. Extensive chronic microvascular ischemic change throughout the white matter. Chronic infarct in the cerebellum bilaterally right greater than left. Brainstem intact. Negative for intracranial hemorrhage Calcified extradural mass right parafalcine region unchanged from prior studies and compatible with calcified meningioma. No fluid collection or shift of the midline structures.  Pituitary not enlarged. No skull base lesion. Paranasal sinuses clear. IMPRESSION: Possible subcentimeter acute infarct in the right parietal lobe. Alternately, this could represent old infarct with T2 shine  through Advanced atrophy.  Extensive chronic ischemic change. Right parafalcine calcified meningioma unchanged. Electronically Signed   By: Franchot Gallo M.D.   On: 09/13/2015 09:59   US Renal  09/13/2015  CLINICAL DATA:  Acute renal failure. History of prostate cancer. History of nephrectomy. EXAM: RENAL / URINARY TRACT ULTRASOUND COMPLETE COMPARISON:  CT of the abdomen and pelvis 05/17/2015 FINDINGS: Right Kidney: Length: Surgically absent. Left Kidney: Length: 11.9 cm. Midpole cyst is 1.7 x 1.5 x 1.3 cm. Lower pole cyst is 2.1 x 2.0 x 2.1 cm. Bladder: Appears normal for degree of bladder distention. Left ureteral jet is normal in appearance. IMPRESSION: 1. Status post right nephrectomy. 2. Left renal cysts.  No hydronephrosis. Electronically Signed   By: Nolon Nations M.D.   On: 09/13/2015 17:49   US Carotid Bilateral  09/13/2015  CLINICAL DATA:  Stroke. EXAM: BILATERAL CAROTID DUPLEX ULTRASOUND TECHNIQUE: Pearline Cables scale imaging, color Doppler and duplex ultrasound were performed of bilateral carotid and vertebral arteries in the neck. COMPARISON:  CT and MRI 09/13/2015 FINDINGS: Criteria: Quantification of carotid stenosis is based on velocity parameters that correlate the residual internal carotid diameter with NASCET-based stenosis levels, using the diameter of the distal internal carotid lumen as the denominator for stenosis measurement. The following velocity measurements were obtained: RIGHT ICA:  79 cm/sec CCA:  98 cm/sec SYSTOLIC ICA/CCA RATIO:  0.8 DIASTOLIC ICA/CCA RATIO:  1.8 ECA:  180 cm/sec LEFT ICA:  150 cm/sec CCA:  [id number] cm/sec SYSTOLIC ICA/CCA RATIO:  1.4 DIASTOLIC ICA/CCA RATIO:  1.9 ECA:  217 cm/sec RIGHT CAROTID ARTERY: Mild calcified plaque at the right carotid bulb. No significant  plaque within the internal carotid artery. Normal Doppler waveforms. RIGHT VERTEBRAL ARTERY:  Normal antegrade flow. LEFT CAROTID ARTERY: Moderate homogeneous soft plaque at the left carotid bulb and origin of the left internal carotid artery. Normal Doppler waveforms. LEFT VERTEBRAL ARTERY:  Normal antegrade flow. IMPRESSION: Moderate plaque at the left carotid bulb and proximal internal carotid artery. Velocity measurements compatible with 50-69% stenosis of the proximal left internal carotid artery. No significant plaque or hemodynamically significant stenosis of the right internal carotid artery. Normal antegrade flow within the vertebral arteries. Electronically Signed   By: Marin Olp M.D.   On: 09/13/2015 17:54    EKG:  Orders placed or performed during the hospital encounter of 09/12/15  . ED EKG  . ED EKG    ASSESSMENT AND PLAN:  Principal Problem:   ARF (acute renal failure) (HCC) Active Problems:   Parkinson's disease ([place])   Ataxia   Weakness generalized   Dehydration   Acute cerebral infarction ([place])  #1. Acute on chronic renal failure,  solitary kidney, status post right nephrectomy, underlying CK D stage V, improved with IV fluid administrationthe baseline of 2.29 ,  Urinalysis was unremarkable, unlikely urinary tract infection. Appreciate nephrology's input , follow kidney function closely  #2 . Parkinson's disease, appreciate neurology input, no change in medications at this point, appreciate physical therapy input, patient will be going to rehabilitation facility, discussed with neurologist   #3 acute stroke in the right parietal lobe, initial   CT scan of head was unremarkable. However MRI of the brain revealed possible right parietal lobe acute infarct, continue patient on  aspirin ,  Lipitor,  appreciated neurology input, patient is to be evaluated by speech therapist as well as physical therapist . Normal echocardiogram, carotid ultrasound showed 50-69% stenosis of  proximal left internal carotid artery . Marland Kitchen Patient is to be evaluated  by physical therapist , likely skilled nursing facility placement #4. Leukocytosis, likely  dehydration, stress, resolved. Chest x-ray revealed atelectasis at lung bases, urinalysis was unremarkable  5. Elevated troponin, likely demand ischemia as patient had symptoms, appreciate cardiologist input, echocardiogram was unremarkable, no further studies were recommended by cardiologist.   Management plans discussed with the patient, family and they are in agreement.   DRUG ALLERGIES:  Allergies  Allergen Reactions  . Ace Inhibitors   . Lisinopril     Affects kidney  . Nsaids   . Wellbutrin [Bupropion]     confusion    CODE STATUS:     Code Status Orders        Start     Ordered   09/13/15 0132  Full code   Continuous     09/13/15 0131    Code Status History    Date Active Date Inactive Code Status Order ID Comments User Context   This patient has a current code status but no historical code status.      TOTAL TIME TAKING CARE OF THIS PATIENT: 45 minutes.   discussed with patient's wife and neurologist for about 15 minutes  Prakriti Carignan M.D on 09/14/2015 at 3:47 PM  Between 7am to 6pm - Pager - [phone]  After 6pm go to [email or web address] - password [email or web address] [facility]  [facility]  Office  [phone]  CC: Primary care physician; Alesia Richards, MD

## 2015-09-14 NOTE — Progress Notes (Addendum)
Initial Nutrition Assessment   INTERVENTION:   Meals and Snacks: Cater to patient preferences Medical Food Supplement Therapy: will recommend Ensure Enlive po BID, each supplement provides 350 kcal and 20 grams of protein   NUTRITION DIAGNOSIS:   Swallowing difficulty related to dysphagia as evidenced by  (diet order, SLP following).  GOAL:   Patient will meet greater than or equal to 90% of their needs  MONITOR:    (Energy intake, Electrolyte and renal Profile, Anthropometrics, Digestive System)  REASON FOR ASSESSMENT:   Diagnosis    ASSESSMENT:   Pt admitted with acute renal failure with h/o Parkinson's disease.  Past Medical History  Diagnosis Date  . BPH (benign prostatic hypertrophy) with urinary obstruction   . CVA (cerebral infarction)   . Hypertension   . Parkinson disease ([place])   . Renal disorder   . Stroke ([place])   . Cancer [facility])      Diet Order:  DIET DYS 3 Room service appropriate?: Yes; Fluid consistency:: Thin    Current Nutrition: Pt reports eating well this am including eggs and toast with yogurt. Recorded po intake 40% of breakfast. Pt reports not being able to eat his chicken sandwich last night as the bread was too hard.   Food/Nutrition-Related History: Pt reports 'pretty good' appetite. Pt reports his swallowing is 'not as good as it used to be' and therefore eats softer foods. Pt reports not drinking supplements such as Ensure but has been wanting to get some.    Scheduled Medications:  . atorvastatin  40 mg Oral q1800  . carbidopa-levodopa  1 tablet Oral TID  . clopidogrel  75 mg Oral Daily  . docusate sodium  100 mg Oral BID  . famotidine  20 mg Oral QHS  . heparin  5,000 Units Subcutaneous 3 times per day  . labetalol  100 mg Oral BID  . multivitamin with minerals  1 tablet Oral Q supper  . polyethylene glycol  17 g Oral Daily    Continuous Medications:  . sodium chloride 100 mL/hr at 09/13/15 2330     Electrolyte/Renal  Profile and Glucose Profile:   Recent Labs Lab 09/09/15 1651 09/12/15 2041 09/13/15 0503 09/14/15 0228  NA 136 135 141 140  K 4.9 4.7 4.3 4.1  CL 101 103 111 113*  CO2 23 23 22  19*  BUN 54* 47* 42* 40*  CREATININE 3.06* 2.52* 2.33* 2.29*  CALCIUM 9.6 9.3 8.8* 8.2*  MG 2.5  --   --   --   GLUCOSE 111* 108* 112* 98   Protein Profile:  Recent Labs Lab 09/09/15 1651 09/12/15 2041 09/13/15 0503  ALBUMIN 4.2 4.1 3.5    Gastrointestinal Profile: Last BM:  09/14/2015   Nutrition-Focused Physical Exam Findings:  Unable to complete Nutrition-Focused physical exam at this time.     Weight Change: Pt reports usual body weight of 155-160lbs. Weight gain per [facility] weight encounters.   Skin:  Reviewed, no issues   Height:   Ht Readings from Last 1 Encounters:  09/13/15 5\' 8"  (1.727 m)    Weight:   Wt Readings from Last 1 Encounters:  09/14/15 172 lb 4.8 oz (78.155 kg)   Wt Readings from Last 10 Encounters:  09/14/15 172 lb 4.8 oz (78.155 kg)  09/09/15 164 lb 12.8 oz (74.753 kg)  07/07/15 167 lb 3.2 oz (75.841 kg)  05/26/15 167 lb (75.751 kg)  05/18/15 167 lb 12.8 oz (76.114 kg)  05/17/15 155 lb (70.308 kg)  04/02/15 167 lb  6.4 oz (75.932 kg)  01/15/15 161 lb 12.8 oz (73.392 kg)  12/02/14 157 lb (71.215 kg)  11/18/14 159 lb (72.122 kg)    BMI:  Body mass index is 26.2 kg/(m^2).  Estimated Nutritional Needs:   Kcal:  BEE: 1420kcals, TEE: (IF 1.1-1.3)(AF 1.2) [id number]  Protein:  62-78g protein (0.8-1.0g/kg)  Fluid:  1950-236mL of fluid (25-70mL/kg)  EDUCATION NEEDS:   No education needs identified at this time   [place], RD, LDN Pager [phone] Weekend/On-Call Pager [phone]

## 2015-09-14 NOTE — Evaluation (Signed)
Clinical/Bedside Swallow Evaluation Patient Details  Name: Gary Ortega MRN: [id number] Date of Birth: 1925/04/08  Today's Date: 09/14/2015 Time: SLP Start Time (ACUTE ONLY): 1115 SLP Stop Time (ACUTE ONLY): 1155 SLP Time Calculation (min) (ACUTE ONLY): 40 min  Past Medical History:  Past Medical History  Diagnosis Date  . BPH (benign prostatic hypertrophy) with urinary obstruction   . CVA (cerebral infarction)   . Hypertension   . Parkinson disease ([place])   . Renal disorder   . Stroke ([place])   . Cancer [facility])    Past Surgical History:  Past Surgical History  Procedure Laterality Date  . Colonoscopy    . Cystoscopy      prostate radioactive I-125 seed impklantation   . Mandible fracture surgery  1948  . Appendectomy    . Hemorrhoid surgery    . Cataract extraction    . Total nephrectomy Right 2012   HPI:  Gary Ortega is an 82 y.o. male who has a history of PD managed with TID Sinemet, ataxia, history of CVA with active medical dx of dehydration. Gary Ortega given IV hydration with some improvement in phsycial ability noted. Gary Ortega lives at home alone with support of his daughters. Per family patient has been having some trouble with gait since 2015. Has been progressively worsening since September of 2016. Patient has also had trouble getting up from a seated position. Gary Ortega's speech is intelligible with appropriate volume. Able to communicate wants/needs, oriented and able to follow directions. Gary Ortega able to feed self. Current diet of dysphagia 3 with nectar thick liquids. Nursing reported that Gary Ortega consumes his medicine whole with thin liquids without incident. Gary Ortega with thin liquid water jug at bedside. Gary Ortega stated that "breakfast didn't go so well" this morning. He further explained that his head "was tilted too far forward."     Assessment / Plan / Recommendation Clinical Impression  Gary Ortega with mild oropharyngeal dysphagia c/b decreased rotary chew. Gary Ortega reports difficulty with breakfast d/t  positioning. During dysphagia evaluation, Gary Ortega able to tolerate upright position. Gary Ortega with good ability to feed self. Gary Ortega without overt s/s of aspiration with trials of thin liquids via jug straw. Gary Ortega with audible swallow, timely initiation and clear voice throughout even with successive sips. No respiratory changes.  Gary Ortega would benefit of diet upgrade to thin liquids. Overall ability with dysphagia 3 diet is functional, however Gary Ortega with occasional decreased rotary chew. Skilled ST is indicated to monitior tolerance of dysphagia 3, upgrade to thin liquids and review of general aspiration precautions.  Gary Ortega with good progress in session as he was able to independently alternate liquids and solids.     Aspiration Risk  Mild aspiration risk    Diet Recommendation   Dysphagia 3 with thin liquids  Medication Administration: Whole meds with liquid    Other  Recommendations Oral Care Recommendations: Oral care BID   Follow up Recommendations  Skilled Nursing facility    Frequency and Duration min 3x week  2 weeks       Prognosis Prognosis for Safe Diet Advancement: Good      Swallow Study   General Date of Onset: 09/12/15 HPI: Gary Ortega is an 82 y.o. male who has a history of PD managed with TID Sinemet, ataxia, history of CVA with active medical dx of dehydration. Gary Ortega given IV hydration with some improvement in phsycial ability noted. Gary Ortega lives at home alone with support of his daughters. Per family patient has been having some trouble with gait since  2015. Has been progressively worsening since September of 2016. Patient has also had trouble getting up from a seated position. Gary Ortega's speech is intelligible with appropriate volume. Able to communicate wants/needs, oriented and able to follow directions. Gary Ortega able to feed self. Current diet of dysphagia 3 with nectar thick liquids. Nursing reported that Gary Ortega consumes his medicine whole with thin liquids without incident. Gary Ortega with thin liquid water jug at  bedside. Gary Ortega stated that "breakfast didn't go so well" this morning. He further explained that his head "was tilted too far forward."   Type of Study: Bedside Swallow Evaluation Previous Swallow Assessment:  (None) Diet Prior to this Study: Dysphagia 3 (soft);Nectar-thick liquids Temperature Spikes Noted: No Respiratory Status: Room air History of Recent Intubation: No Behavior/Cognition: Alert;Cooperative;Pleasant mood Oral Cavity Assessment: Within Functional Limits Oral Care Completed by SLP: No (Gary Ortega currently eating lunch.) Oral Cavity - Dentition: Adequate natural dentition Vision: Functional for self-feeding Self-Feeding Abilities: Able to feed self Patient Positioning: Upright in bed Baseline Vocal Quality: Normal Volitional Cough: Strong Volitional Swallow: Able to elicit    Oral/Motor/Sensory Function Overall Oral Motor/Sensory Function: Within functional limits   Ice Chips Ice chips: Not tested   Thin Liquid Thin Liquid: Within functional limits Presentation: Straw;Cup (Self fed; 6 oz with meal and entire ice cream) Other Comments:  (Gary Ortega without s/s of aspiration)    Nectar Thick Nectar Thick Liquid: Not tested   Honey Thick   Not tested  Puree Puree: Within functional limits Presentation: Self Fed;Spoon (6 trials of mashed potatoes) Other Comments:  (Gary Ortega cleared well)   Solid   GO   Solid: Impaired (Mild) Presentation: Self Fed;Spoon (Chopped Meats - 4 trials and 1/2 pack graham crackers) Oral Phase Impairments:  (Mildly reduced rotary chew) Oral Phase Functional Implications:  (Mildly decreased rotary chew) Other Comments:  (Chopped meats with gravy - 4 trials/ 1/2 pack graham cracker)       Panagiota Perfetti B. Rutherford Nail, M.S., CCC-SLP  Gary Ortega 09/14/2015,1:57 PM

## 2015-09-14 NOTE — Consult Note (Signed)
Date: 09/14/2015                  Patient Name:  Gary Ortega  MRN: [id number]  DOB: [birth year]  Age / Sex: 82 y.o., male         PCP: Alesia Richards, MD                 Service Requesting Consult: Internal medicine                 Reason for Consult: ARF on CKD            History of Present Illness: Patient is a 82 y.o. male with medical problems of Parkinson's disease, prior stroke, BPH, CKD , who was admitted to [facility] on 09/12/2015 for evaluation of progressive weakness and ataxia.  Nephrology consult is requested for acute renal failure on chronic kidney disease stage IV Patient has a history of nephrectomy 30 years ago.  It appears that it was done for a suspicious mass which turned out not to be cancer Patient's baseline creatinine appears to be in mid twos.  Admission creatinine on 2/15 was 3.06 Today's creatinine is 2.29/GFR of 24   Medications: Outpatient medications: Prescriptions prior to admission  Medication Sig Dispense Refill Last Dose  . carbidopa-levodopa (SINEMET IR) 25-250 MG tablet Take 1 tablet by mouth 3 (three) times daily. 270 tablet 1 Taking  . Cholecalciferol (VITAMIN D3) 5000 UNITS CAPS Take by mouth daily.     Taking  . clopidogrel (PLAVIX) 75 MG tablet TAKE 1 TABLET DAILY TO     PREVENT STROKE 90 tablet 1 Taking  . Fish Oil OIL Take by mouth daily. 1 tablespoon    Taking  . ketoconazole (NIZORAL) 2 % shampoo    Taking  . LOCOID 0.1 % LOTN    Taking  . losartan (COZAAR) 100 MG tablet Take 1/2 to 1 tablet daily for BP as directed 30 tablet 99 Taking  . metoprolol succinate (TOPROL-XL) 25 MG 24 hr tablet 1/2-1 pill at night for blood pressure 90 tablet 1 Taking  . multivitamin (THERAGRAN) per tablet Take 1 tablet by mouth daily.     Taking  . nitroGLYCERIN (NITROSTAT) 0.4 MG SL tablet Place 1 tablet (0.4 mg total) under the tongue every 5 (five) minutes as needed for chest pain. One tablet under tongue at onset of chest pain; may repeat every  5 minutes for up to 3 doses 30 tablet PRN Taking  . vitamin C (ASCORBIC ACID) 500 MG tablet Take 500 mg by mouth daily.   Taking    Current medications: Current Facility-Administered Medications  Medication Dose Route Frequency Provider Last Rate Last Dose  . 0.9 %  sodium chloride infusion   Intravenous Continuous Idelle Crouch, MD 100 mL/hr at 09/13/15 2330    . acetaminophen (TYLENOL) tablet 650 mg  650 mg Oral Q6H PRN Idelle Crouch, MD       Or  . acetaminophen (TYLENOL) suppository 650 mg  650 mg Rectal Q6H PRN Idelle Crouch, MD      . atorvastatin (LIPITOR) tablet 40 mg  40 mg Oral q1800 Theodoro Grist, MD   40 mg at 09/14/15 0915  . bisacodyl (DULCOLAX) suppository 10 mg  10 mg Rectal Daily PRN Idelle Crouch, MD      . carbidopa-levodopa (SINEMET IR) 25-250 MG per tablet immediate release 1 tablet  1 tablet Oral TID Idelle Crouch, MD   1 tablet at 09/14/15  [id number]  . clopidogrel (PLAVIX) tablet 75 mg  75 mg Oral Daily Idelle Crouch, MD   75 mg at 09/14/15 0915  . docusate sodium (COLACE) capsule 100 mg  100 mg Oral BID Idelle Crouch, MD   100 mg at 09/14/15 0915  . famotidine (PEPCID) tablet 20 mg  20 mg Oral QHS Idelle Crouch, MD   20 mg at 09/13/15 2141  . heparin injection 5,000 Units  5,000 Units Subcutaneous 3 times per day Idelle Crouch, MD   5,000 Units at 09/14/15 0610  . labetalol (NORMODYNE) tablet 100 mg  100 mg Oral BID Theodoro Grist, MD   100 mg at 09/14/15 0915  . morphine 2 MG/ML injection 2 mg  2 mg Intravenous Q4H PRN Idelle Crouch, MD      . multivitamin with minerals tablet 1 tablet  1 tablet Oral Q supper Idelle Crouch, MD   1 tablet at 09/14/15 0914  . nitroGLYCERIN (NITROSTAT) SL tablet 0.4 mg  0.4 mg Sublingual Q5 min PRN Idelle Crouch, MD      . ondansetron [facility]) tablet 4 mg  4 mg Oral Q6H PRN Idelle Crouch, MD       Or  . ondansetron [facility]) injection 4 mg  4 mg Intravenous Q6H PRN Idelle Crouch, MD      . polyethylene  glycol (MIRALAX / GLYCOLAX) packet 17 g  17 g Oral Daily Theodoro Grist, MD   17 g at 09/14/15 0957      Allergies: Allergies  Allergen Reactions  . Ace Inhibitors   . Lisinopril     Affects kidney  . Nsaids   . Wellbutrin [Bupropion]     confusion      Past Medical History: Past Medical History  Diagnosis Date  . BPH (benign prostatic hypertrophy) with urinary obstruction   . CVA (cerebral infarction)   . Hypertension   . Parkinson disease ([place])   . Renal disorder   . Stroke ([place])   . Cancer [facility])      Past Surgical History: Past Surgical History  Procedure Laterality Date  . Colonoscopy    . Cystoscopy      prostate radioactive I-125 seed impklantation   . Mandible fracture surgery  1948  . Appendectomy    . Hemorrhoid surgery    . Cataract extraction    . Total nephrectomy Right 2012     Family History: Family History  Problem Relation Age of Onset  . Stroke Mother   . Hypertension Mother   . Stroke Father   . Hypertension Brother      Social History: Social History   Social History  . Marital Status: Widowed    Spouse Name: N/A  . Number of Children: N/A  . Years of Education: N/A   Occupational History  . Not on file.   Social History Main Topics  . Smoking status: Never Smoker   . Smokeless tobacco: Not on file  . Alcohol Use: No  . Drug Use: Not on file  . Sexual Activity: Not on file   Other Topics Concern  . Not on file   Social History Narrative   Married 63 years. Retired form [employer]     Review of Systems: Gen: no fevers or weight changes HEENT: denies complaints CV: denies chest pain or irregular heartbeat Resp: denies any problems with breathing [id number] to eat without nausea or vomiting GU : no problems reported MS: he uses a walker  to get around Derm:  no problems reported Psych:no complaints Heme: no complaints Neuro: generalized weakness, Parkinson's Endocrine.  No complaints  Vital Signs: Blood pressure  150/62, pulse 69, temperature 97.7 F (36.5 C), temperature source Oral, resp. rate 18, height 5\' 8"  (1.727 m), weight 78.155 kg (172 lb 4.8 oz), SpO2 98 %.   Intake/Output Summary (Last 24 hours) at 09/14/15 1102 Last data filed at 09/14/15 0841  Gross per 24 hour  Intake   1560 ml  Output   2825 ml  Net  -1265 ml    Weight trends: [employer]   09/12/15 2037 09/13/15 0127 09/14/15 0500  Weight: 70.308 kg (155 lb) 76.295 kg (168 lb 3.2 oz) 78.155 kg (172 lb 4.8 oz)    Physical Exam: General:  no acute distress, laying in the bed  HEENT Anicteric, moist mucous membranes  Neck:  supple  Lungs: Normal respiratory effort  Heart::  no rub or gallop  Abdomen: Soft, nontender, nondistended  Extremities:  no peripheral edema  Neurologic: Alert, speech normal,hand tremors bilaterally  Skin: No acute rashes  Access:           Lab results: Basic Metabolic Panel:  Recent Labs Lab 09/09/15 1651 09/12/15 2041 09/13/15 0503 09/14/15 0228  NA 136 135 141 140  K 4.9 4.7 4.3 4.1  CL 101 103 111 113*  CO2 23 23 22  19*  GLUCOSE 111* 108* 112* 98  BUN 54* 47* 42* 40*  CREATININE 3.06* 2.52* 2.33* 2.29*  CALCIUM 9.6 9.3 8.8* 8.2*  MG 2.5  --   --   --     Liver Function Tests:  Recent Labs Lab 09/13/15 0503  AST 25  ALT 19  ALKPHOS 49  BILITOT 0.7  PROT 6.5  ALBUMIN 3.5    Recent Labs Lab 09/12/15 2041  LIPASE 32   No results for input(s): AMMONIA in the last 168 hours.  CBC:  Recent Labs Lab 09/09/15 1651 09/12/15 2041 09/13/15 0503 09/14/15 0228  WBC 8.3 10.2 10.9* 9.7  NEUTROABS 5.9 8.0*  --   --   HGB 13.6 13.7 12.6* 11.9*  HCT 41.3 41.2 37.2* 35.6*  MCV 89.2 87.3 89.3 89.1  PLT 258 247 220 207    Cardiac Enzymes:  Recent Labs Lab 09/09/15 1651  09/14/15 0228  CKTOTAL 73  --   --   TROPONINI  --   < > 0.06*  < > = values in this interval not displayed.  BNP: Invalid input(s): POCBNP  CBG:  Recent Labs Lab 09/13/15 0748   GLUCAP 115*    Microbiology: Recent Results (from the past 720 hour(s))  Urine culture     Status: None   Collection Time: 09/09/15  4:49 PM  Result Value Ref Range Status   Colony Count 10,000 COLONIES/ML  Final   Organism ID, Bacteria Multiple bacterial morphotypes present, none  Final   Organism ID, Bacteria predominant. Suggest appropriate recollection if   Final   Organism ID, Bacteria clinically indicated.  Final     Coagulation Studies: No results for input(s): LABPROT, INR in the last 72 hours.  Urinalysis:  Recent Labs  09/12/15 2041  COLORURINE STRAW*  LABSPEC 1.004*  PHURINE 6.0  GLUCOSEU NEGATIVE  HGBUR 1+*  BILIRUBINUR NEGATIVE  KETONESUR NEGATIVE  PROTEINUR 100*  NITRITE NEGATIVE  LEUKOCYTESUR NEGATIVE        Imaging: Dg Chest 1 View  09/12/2015  CLINICAL DATA:  Weakness and hypertension. Cough. Dizziness. Ataxia. EXAM: CHEST 1 VIEW  COMPARISON:  05/17/2015 FINDINGS: Shallow inspiration with atelectasis in the lung bases. Heart size and pulmonary vascularity are normal for technique. No focal consolidation. No blunting of costophrenic angles. No pneumothorax. Tortuous aorta. Degenerative changes in the spine. Appearance of the chest is similar to previous study. IMPRESSION: Shallow inspiration with probable atelectasis in the lung bases. Electronically Signed   By: Lucienne Capers M.D.   On: 09/12/2015 21:39   Ct Head Wo Contrast  09/12/2015  CLINICAL DATA:  Hypertension, dizziness, and difficulty walking for 3 days. Difficulty keeping balance and falling backwards. History of CVA with residual left-sided deficit. EXAM: CT HEAD WITHOUT CONTRAST TECHNIQUE: Contiguous axial images were obtained from the base of the skull through the vertex without intravenous contrast. COMPARISON:  05/19/2015 FINDINGS: Diffuse cerebral atrophy. Ventricular dilatation consistent with central atrophy. Low-attenuation changes in the deep white matter consistent with small vessel  ischemia. Focal area of mild encephalomalacia in the left insular region is unchanged since previous study. Old right cerebellar infarct. Focal calcification adjacent to the right side of the falx is unchanged since previous study and possibly represents a meningioma or dystrophic calcification. Gray-white matter junctions are mostly distinct. Basal cisterns are not effaced. No abnormal extra-axial fluid collections. No mass effect or midline shift. No evidence of acute intracranial hemorrhage. Calvarium appears intact. Vascular calcifications. Mild mucosal thickening in the paranasal sinuses. No acute air-fluid levels. IMPRESSION: No acute intracranial abnormalities are demonstrated. Unchanged chronic findings as discussed, including atrophy with small vessel ischemic change, old left insular and old right cerebellar infarcts, and calcification along the right side of the falx. Electronically Signed   By: Lucienne Capers M.D.   On: 09/12/2015 22:12   Mr Brain Wo Contrast  09/13/2015  CLINICAL DATA:  Ataxia.  Parkinson's. EXAM: MRI HEAD WITHOUT CONTRAST TECHNIQUE: Multiplanar, multiecho pulse sequences of the brain and surrounding structures were obtained without intravenous contrast. COMPARISON:  CT head 09/12/2015, MRI 11/03/2014 FINDINGS: Subcentimeter focus of hyperintensity diffusion-weighted imaging in the right parietal lobe. It is difficult to determine if this is acute infarct versus T2 shine through. Correlate with neurologic exam. No other areas of acute infarct. Moderate to advanced atrophy. Extensive chronic microvascular ischemic change throughout the white matter. Chronic infarct in the cerebellum bilaterally right greater than left. Brainstem intact. Negative for intracranial hemorrhage Calcified extradural mass right parafalcine region unchanged from prior studies and compatible with calcified meningioma. No fluid collection or shift of the midline structures. Pituitary not enlarged. No skull  base lesion. Paranasal sinuses clear. IMPRESSION: Possible subcentimeter acute infarct in the right parietal lobe. Alternately, this could represent old infarct with T2 shine through Advanced atrophy.  Extensive chronic ischemic change. Right parafalcine calcified meningioma unchanged. Electronically Signed   By: Franchot Gallo M.D.   On: 09/13/2015 09:59   US Renal  09/13/2015  CLINICAL DATA:  Acute renal failure. History of prostate cancer. History of nephrectomy. EXAM: RENAL / URINARY TRACT ULTRASOUND COMPLETE COMPARISON:  CT of the abdomen and pelvis 05/17/2015 FINDINGS: Right Kidney: Length: Surgically absent. Left Kidney: Length: 11.9 cm. Midpole cyst is 1.7 x 1.5 x 1.3 cm. Lower pole cyst is 2.1 x 2.0 x 2.1 cm. Bladder: Appears normal for degree of bladder distention. Left ureteral jet is normal in appearance. IMPRESSION: 1. Status post right nephrectomy. 2. Left renal cysts.  No hydronephrosis. Electronically Signed   By: Nolon Nations M.D.   On: 09/13/2015 17:49   US Carotid Bilateral  09/13/2015  CLINICAL DATA:  Stroke. EXAM: BILATERAL  CAROTID DUPLEX ULTRASOUND TECHNIQUE: Pearline Cables scale imaging, color Doppler and duplex ultrasound were performed of bilateral carotid and vertebral arteries in the neck. COMPARISON:  CT and MRI 09/13/2015 FINDINGS: Criteria: Quantification of carotid stenosis is based on velocity parameters that correlate the residual internal carotid diameter with NASCET-based stenosis levels, using the diameter of the distal internal carotid lumen as the denominator for stenosis measurement. The following velocity measurements were obtained: RIGHT ICA:  79 cm/sec CCA:  98 cm/sec SYSTOLIC ICA/CCA RATIO:  0.8 DIASTOLIC ICA/CCA RATIO:  1.8 ECA:  180 cm/sec LEFT ICA:  150 cm/sec CCA:  [id number] cm/sec SYSTOLIC ICA/CCA RATIO:  1.4 DIASTOLIC ICA/CCA RATIO:  1.9 ECA:  217 cm/sec RIGHT CAROTID ARTERY: Mild calcified plaque at the right carotid bulb. No significant plaque within the internal carotid  artery. Normal Doppler waveforms. RIGHT VERTEBRAL ARTERY:  Normal antegrade flow. LEFT CAROTID ARTERY: Moderate homogeneous soft plaque at the left carotid bulb and origin of the left internal carotid artery. Normal Doppler waveforms. LEFT VERTEBRAL ARTERY:  Normal antegrade flow. IMPRESSION: Moderate plaque at the left carotid bulb and proximal internal carotid artery. Velocity measurements compatible with 50-69% stenosis of the proximal left internal carotid artery. No significant plaque or hemodynamically significant stenosis of the right internal carotid artery. Normal antegrade flow within the vertebral arteries. Electronically Signed   By: Marin Olp M.D.   On: 09/13/2015 17:54      Assessment & Plan: Pt is a 82 y.o. yo male with a PMHX of Parkinson's disease, prior stroke, BPH, CKD , was admitted on 09/12/2015 with generalized weakness and ataxia.   1.  Acute renal failure on chronic kidney disease stage IV.  Patient has history of right nephrectomy in the remote past for a renal mass - Baseline creatinine probably 2.29/GFR 24 - over the last few days creatinine has been improving - we will consider ACE inhibitor or ARB as an outpatient  2.  Left renal cysts - Observation  Will follow

## 2015-09-14 NOTE — Progress Notes (Addendum)
Subjective: Patient reports no changes but does have better bed mobility today.    Objective: Current vital signs: BP 150/62 mmHg  Pulse 69  Temp(Src) 97.7 F (36.5 C) (Oral)  Resp 18  Ht 5\' 8"  (1.727 m)  Wt 78.155 kg (172 lb 4.8 oz)  BMI 26.20 kg/m2  SpO2 98% Vital signs in last 24 hours: Temp:  [97.7 F (36.5 C)-98.4 F (36.9 C)] 97.7 F (36.5 C) (02/20 0900) Pulse Rate:  [64-81] 69 (02/20 0900) Resp:  [18-20] 18 (02/20 0900) BP: (120-166)/(52-77) 150/62 mmHg (02/20 0900) SpO2:  [95 %-98 %] 98 % (02/20 0900) Weight:  [78.155 kg (172 lb 4.8 oz)] 78.155 kg (172 lb 4.8 oz) (02/20 0500)  Intake/Output from previous day: 02/19 0701 - 02/20 0700 In: 1560 [P.O.:360; I.V.:1200] Out: 2975 [Urine:2975] Intake/Output this shift: Total I/O In: 240 [P.O.:240] Out: 175 [Urine:175] Nutritional status: DIET DYS 3 Room service appropriate?: Yes; Fluid consistency:: Nectar Thick  Neurologic Exam: Mental Status: Alert, oriented, thought content appropriate. Speech fluent without evidence of aphasia. Able to follow 3 step commands without difficulty. Cranial Nerves: II: Discs flat bilaterally; Visual fields grossly normal, pupils equal, round, reactive to light and accommodation III,IV, VI: ptosis not present, extra-ocular motions intact bilaterally V,VII: decrease in the right NLF, facial light touch sensation normal bilaterally VIII: hearing normal bilaterally IX,X: gag reflex present XI: bilateral shoulder shrug XII: midline tongue extension Motor: Right :Upper extremity 5/5Left: Upper extremity 5/5 Lower extremity 5/5Lower extremity 5/5 Increased tone in the BUE's, right greater than left. Pill-rolling tremor noted in both upper extremities right greater than left   Lab Results: Basic Metabolic Panel:  Recent Labs Lab 09/09/15 1651 09/12/15 2041  09/13/15 0503 09/14/15 0228  NA 136 135 141 140  K 4.9 4.7 4.3 4.1  CL 101 103 111 113*  CO2 23 23 22  19*  GLUCOSE 111* 108* 112* 98  BUN 54* 47* 42* 40*  CREATININE 3.06* 2.52* 2.33* 2.29*  CALCIUM 9.6 9.3 8.8* 8.2*  MG 2.5  --   --   --     Liver Function Tests:  Recent Labs Lab 09/09/15 1651 09/12/15 2041 09/13/15 0503  AST 19 29 25   ALT 5* 18 19  ALKPHOS 58 55 49  BILITOT 0.3 0.7 0.7  PROT 7.2 7.7 6.5  ALBUMIN 4.2 4.1 3.5    Recent Labs Lab 09/12/15 2041  LIPASE 32   No results for input(s): AMMONIA in the last 168 hours.  CBC:  Recent Labs Lab 09/09/15 1651 09/12/15 2041 09/13/15 0503 09/14/15 0228  WBC 8.3 10.2 10.9* 9.7  NEUTROABS 5.9 8.0*  --   --   HGB 13.6 13.7 12.6* 11.9*  HCT 41.3 41.2 37.2* 35.6*  MCV 89.2 87.3 89.3 89.1  PLT 258 247 220 207    Cardiac Enzymes:  Recent Labs Lab 09/09/15 1651 09/12/15 2041 09/13/15 1802 09/13/15 2228 09/14/15 0228  CKTOTAL 73  --   --   --   --   TROPONINI  --  0.05* 0.04* 0.07* 0.06*    Lipid Panel:  Recent Labs Lab 09/14/15 0228  CHOL 177  TRIG 175*  HDL 30*  CHOLHDL 5.9  VLDL 35  LDLCALC 112*    CBG:  Recent Labs Lab 09/13/15 0748  GLUCAP 115*    Microbiology: Results for orders placed or performed in visit on 09/09/15  Urine culture     Status: None   Collection Time: 09/09/15  4:49 PM  Result Value Ref Range Status  Colony Count 10,000 COLONIES/ML  Final   Organism ID, Bacteria Multiple bacterial morphotypes present, none  Final   Organism ID, Bacteria predominant. Suggest appropriate recollection if   Final   Organism ID, Bacteria clinically indicated.  Final    Coagulation Studies: No results for input(s): LABPROT, INR in the last 72 hours.  Imaging: Dg Chest 1 View  09/12/2015  CLINICAL DATA:  Weakness and hypertension. Cough. Dizziness. Ataxia. EXAM: CHEST 1 VIEW COMPARISON:  05/17/2015 FINDINGS: Shallow inspiration with atelectasis in the lung bases. Heart  size and pulmonary vascularity are normal for technique. No focal consolidation. No blunting of costophrenic angles. No pneumothorax. Tortuous aorta. Degenerative changes in the spine. Appearance of the chest is similar to previous study. IMPRESSION: Shallow inspiration with probable atelectasis in the lung bases. Electronically Signed   By: Lucienne Capers M.D.   On: 09/12/2015 21:39   Ct Head Wo Contrast  09/12/2015  CLINICAL DATA:  Hypertension, dizziness, and difficulty walking for 3 days. Difficulty keeping balance and falling backwards. History of CVA with residual left-sided deficit. EXAM: CT HEAD WITHOUT CONTRAST TECHNIQUE: Contiguous axial images were obtained from the base of the skull through the vertex without intravenous contrast. COMPARISON:  05/19/2015 FINDINGS: Diffuse cerebral atrophy. Ventricular dilatation consistent with central atrophy. Low-attenuation changes in the deep white matter consistent with small vessel ischemia. Focal area of mild encephalomalacia in the left insular region is unchanged since previous study. Old right cerebellar infarct. Focal calcification adjacent to the right side of the falx is unchanged since previous study and possibly represents a meningioma or dystrophic calcification. Gray-white matter junctions are mostly distinct. Basal cisterns are not effaced. No abnormal extra-axial fluid collections. No mass effect or midline shift. No evidence of acute intracranial hemorrhage. Calvarium appears intact. Vascular calcifications. Mild mucosal thickening in the paranasal sinuses. No acute air-fluid levels. IMPRESSION: No acute intracranial abnormalities are demonstrated. Unchanged chronic findings as discussed, including atrophy with small vessel ischemic change, old left insular and old right cerebellar infarcts, and calcification along the right side of the falx. Electronically Signed   By: Lucienne Capers M.D.   On: 09/12/2015 22:12   Mr Brain Wo  Contrast  09/13/2015  CLINICAL DATA:  Ataxia.  Parkinson's. EXAM: MRI HEAD WITHOUT CONTRAST TECHNIQUE: Multiplanar, multiecho pulse sequences of the brain and surrounding structures were obtained without intravenous contrast. COMPARISON:  CT head 09/12/2015, MRI 11/03/2014 FINDINGS: Subcentimeter focus of hyperintensity diffusion-weighted imaging in the right parietal lobe. It is difficult to determine if this is acute infarct versus T2 shine through. Correlate with neurologic exam. No other areas of acute infarct. Moderate to advanced atrophy. Extensive chronic microvascular ischemic change throughout the white matter. Chronic infarct in the cerebellum bilaterally right greater than left. Brainstem intact. Negative for intracranial hemorrhage Calcified extradural mass right parafalcine region unchanged from prior studies and compatible with calcified meningioma. No fluid collection or shift of the midline structures. Pituitary not enlarged. No skull base lesion. Paranasal sinuses clear. IMPRESSION: Possible subcentimeter acute infarct in the right parietal lobe. Alternately, this could represent old infarct with T2 shine through Advanced atrophy.  Extensive chronic ischemic change. Right parafalcine calcified meningioma unchanged. Electronically Signed   By: Franchot Gallo M.D.   On: 09/13/2015 09:59   US Renal  09/13/2015  CLINICAL DATA:  Acute renal failure. History of prostate cancer. History of nephrectomy. EXAM: RENAL / URINARY TRACT ULTRASOUND COMPLETE COMPARISON:  CT of the abdomen and pelvis 05/17/2015 FINDINGS: Right Kidney: Length: Surgically absent. Left Kidney:  Length: 11.9 cm. Midpole cyst is 1.7 x 1.5 x 1.3 cm. Lower pole cyst is 2.1 x 2.0 x 2.1 cm. Bladder: Appears normal for degree of bladder distention. Left ureteral jet is normal in appearance. IMPRESSION: 1. Status post right nephrectomy. 2. Left renal cysts.  No hydronephrosis. Electronically Signed   By: Nolon Nations M.D.   On:  09/13/2015 17:49   US Carotid Bilateral  09/13/2015  CLINICAL DATA:  Stroke. EXAM: BILATERAL CAROTID DUPLEX ULTRASOUND TECHNIQUE: Pearline Cables scale imaging, color Doppler and duplex ultrasound were performed of bilateral carotid and vertebral arteries in the neck. COMPARISON:  CT and MRI 09/13/2015 FINDINGS: Criteria: Quantification of carotid stenosis is based on velocity parameters that correlate the residual internal carotid diameter with NASCET-based stenosis levels, using the diameter of the distal internal carotid lumen as the denominator for stenosis measurement. The following velocity measurements were obtained: RIGHT ICA:  79 cm/sec CCA:  98 cm/sec SYSTOLIC ICA/CCA RATIO:  0.8 DIASTOLIC ICA/CCA RATIO:  1.8 ECA:  180 cm/sec LEFT ICA:  150 cm/sec CCA:  [id number] cm/sec SYSTOLIC ICA/CCA RATIO:  1.4 DIASTOLIC ICA/CCA RATIO:  1.9 ECA:  217 cm/sec RIGHT CAROTID ARTERY: Mild calcified plaque at the right carotid bulb. No significant plaque within the internal carotid artery. Normal Doppler waveforms. RIGHT VERTEBRAL ARTERY:  Normal antegrade flow. LEFT CAROTID ARTERY: Moderate homogeneous soft plaque at the left carotid bulb and origin of the left internal carotid artery. Normal Doppler waveforms. LEFT VERTEBRAL ARTERY:  Normal antegrade flow. IMPRESSION: Moderate plaque at the left carotid bulb and proximal internal carotid artery. Velocity measurements compatible with 50-69% stenosis of the proximal left internal carotid artery. No significant plaque or hemodynamically significant stenosis of the right internal carotid artery. Normal antegrade flow within the vertebral arteries. Electronically Signed   By: Marin Olp M.D.   On: 09/13/2015 17:54    Medications:  I have reviewed the patient's current medications. Scheduled: . atorvastatin  40 mg Oral q1800  . carbidopa-levodopa  1 tablet Oral TID  . clopidogrel  75 mg Oral Daily  . docusate sodium  100 mg Oral BID  . famotidine  20 mg Oral QHS  . heparin   5,000 Units Subcutaneous 3 times per day  . labetalol  100 mg Oral BID  . multivitamin with minerals  1 tablet Oral Q supper  . polyethylene glycol  17 g Oral Daily    Assessment/Plan: Patient with better bed mobility today.  Able to come to the side of the bed without assistance. LDL 112.  A1c pending. Echocardiogram shows an EF of 60-65% with no cardiac source of emboli noted.  Carotid dopplers show 0000000 LICA stenosis (asymptomatic) and normal RICA.    Recommendations: 1.  Continue therapy 2.  Continue Plavix 3.  Carotid stenosis to be followed up on an outpatient basis.   3.  No further neurologic intervention is recommended at this time.  If further questions arise, please call or page at that time.  Thank you for allowing neurology to participate in the care of this patient.  Patient to follow up with neurology on an outpatient basis after discharge.    LOS: 2 days   Alexis Goodell, MD Neurology [phone] 09/14/2015  10:52 AM

## 2015-09-15 ENCOUNTER — Ambulatory Visit: Payer: Medicare Other | Admitting: Urology

## 2015-09-15 DIAGNOSIS — I69391 Dysphagia following cerebral infarction: Secondary | ICD-10-CM | POA: Diagnosis not present

## 2015-09-15 DIAGNOSIS — IMO0002 Reserved for concepts with insufficient information to code with codable children: Secondary | ICD-10-CM

## 2015-09-15 DIAGNOSIS — I69393 Ataxia following cerebral infarction: Secondary | ICD-10-CM | POA: Diagnosis not present

## 2015-09-15 DIAGNOSIS — G20 Parkinson's disease: Secondary | ICD-10-CM | POA: Diagnosis not present

## 2015-09-15 DIAGNOSIS — N390 Urinary tract infection, site not specified: Secondary | ICD-10-CM | POA: Diagnosis not present

## 2015-09-15 DIAGNOSIS — I69398 Other sequelae of cerebral infarction: Secondary | ICD-10-CM | POA: Diagnosis not present

## 2015-09-15 DIAGNOSIS — I10 Essential (primary) hypertension: Secondary | ICD-10-CM | POA: Diagnosis not present

## 2015-09-15 DIAGNOSIS — M6281 Muscle weakness (generalized): Secondary | ICD-10-CM | POA: Diagnosis not present

## 2015-09-15 DIAGNOSIS — I6529 Occlusion and stenosis of unspecified carotid artery: Secondary | ICD-10-CM | POA: Diagnosis not present

## 2015-09-15 DIAGNOSIS — I2510 Atherosclerotic heart disease of native coronary artery without angina pectoris: Secondary | ICD-10-CM | POA: Diagnosis not present

## 2015-09-15 DIAGNOSIS — Z905 Acquired absence of kidney: Secondary | ICD-10-CM

## 2015-09-15 DIAGNOSIS — N184 Chronic kidney disease, stage 4 (severe): Secondary | ICD-10-CM

## 2015-09-15 DIAGNOSIS — K259 Gastric ulcer, unspecified as acute or chronic, without hemorrhage or perforation: Secondary | ICD-10-CM | POA: Diagnosis not present

## 2015-09-15 DIAGNOSIS — R1310 Dysphagia, unspecified: Secondary | ICD-10-CM

## 2015-09-15 DIAGNOSIS — I129 Hypertensive chronic kidney disease with stage 1 through stage 4 chronic kidney disease, or unspecified chronic kidney disease: Secondary | ICD-10-CM | POA: Diagnosis not present

## 2015-09-15 DIAGNOSIS — I739 Peripheral vascular disease, unspecified: Secondary | ICD-10-CM

## 2015-09-15 DIAGNOSIS — L853 Xerosis cutis: Secondary | ICD-10-CM | POA: Diagnosis not present

## 2015-09-15 DIAGNOSIS — I639 Cerebral infarction, unspecified: Secondary | ICD-10-CM | POA: Diagnosis not present

## 2015-09-15 DIAGNOSIS — E1122 Type 2 diabetes mellitus with diabetic chronic kidney disease: Secondary | ICD-10-CM | POA: Diagnosis not present

## 2015-09-15 DIAGNOSIS — I6350 Cerebral infarction due to unspecified occlusion or stenosis of unspecified cerebral artery: Secondary | ICD-10-CM | POA: Diagnosis not present

## 2015-09-15 DIAGNOSIS — R778 Other specified abnormalities of plasma proteins: Secondary | ICD-10-CM

## 2015-09-15 DIAGNOSIS — E785 Hyperlipidemia, unspecified: Secondary | ICD-10-CM | POA: Diagnosis not present

## 2015-09-15 DIAGNOSIS — I779 Disorder of arteries and arterioles, unspecified: Secondary | ICD-10-CM

## 2015-09-15 DIAGNOSIS — D72829 Elevated white blood cell count, unspecified: Secondary | ICD-10-CM

## 2015-09-15 DIAGNOSIS — Q600 Renal agenesis, unilateral: Secondary | ICD-10-CM

## 2015-09-15 DIAGNOSIS — N179 Acute kidney failure, unspecified: Secondary | ICD-10-CM | POA: Diagnosis not present

## 2015-09-15 DIAGNOSIS — Z7901 Long term (current) use of anticoagulants: Secondary | ICD-10-CM | POA: Diagnosis not present

## 2015-09-15 DIAGNOSIS — R7989 Other specified abnormal findings of blood chemistry: Secondary | ICD-10-CM

## 2015-09-15 LAB — GLUCOSE, CAPILLARY: Glucose-Capillary: 142 mg/dL — ABNORMAL HIGH (ref 65–99)

## 2015-09-15 MED ORDER — ATORVASTATIN CALCIUM 40 MG PO TABS: 40.0000 mg | ORAL_TABLET | Freq: Every day | ORAL | Status: DC

## 2015-09-15 MED ORDER — ENSURE ENLIVE PO LIQD: 237.0000 mL | Freq: Two times a day (BID) | ORAL | Status: AC

## 2015-09-15 MED ORDER — FAMOTIDINE 20 MG PO TABS: 20.0000 mg | ORAL_TABLET | Freq: Every day | ORAL | Status: AC

## 2015-09-15 MED ORDER — DOCUSATE SODIUM 100 MG PO CAPS: 100.0000 mg | ORAL_CAPSULE | Freq: Two times a day (BID) | ORAL | Status: AC

## 2015-09-15 MED ORDER — POLYETHYLENE GLYCOL 3350 17 G PO PACK: 17.0000 g | PACK | Freq: Every day | ORAL | Status: AC

## 2015-09-15 MED ORDER — BISACODYL 10 MG RE SUPP: 10.0000 mg | Freq: Every day | RECTAL | Status: AC | PRN

## 2015-09-15 NOTE — Discharge Summary (Signed)
[facility] NAME: Gary Ortega    MR#:  [id number]  DATE OF BIRTH:  1925/03/01  DATE OF ADMISSION:  09/12/2015 ADMITTING PHYSICIAN: Idelle Crouch, MD  DATE OF DISCHARGE: No discharge date for patient encounter.  PRIMARY CARE PHYSICIAN: MCKEOWN,WILLIAM DAVID, MD     ADMISSION DIAGNOSIS:  Dizziness [R42] Ataxia [R27.0] Elevated troponin [R79.89]  DISCHARGE DIAGNOSIS:  Principal Problem:   ARF (acute renal failure) (HCC) Active Problems:   Ataxia   Acute cerebral infarction (HCC)   Parkinson's disease ([place])   Solitary kidney   Chronic kidney disease (CKD), stage IV (severe) (HCC)   Dysphagia   Carotid artery disease (HCC)   Weakness generalized   Dehydration   S/p nephrectomy   Leukocytosis   Elevated troponin   SECONDARY DIAGNOSIS:   Past Medical History  Diagnosis Date  . BPH (benign prostatic hypertrophy) with urinary obstruction   . CVA (cerebral infarction)   . Hypertension   . Parkinson disease ([place])   . Renal disorder   . Stroke ([place])   . Cancer ([place])     .pro HOSPITAL COURSE:   Patient is 81 year old male with a history significant for history of Parkinson's disease, prior stroke who presents to the hospital with complaints of progressive weakness and ataxia. On arrival to the hospital. He was noted to be in acute renal failure with creatinine level of 3.06, he was given IV fluids and his condition improved. Patient's creatinine improved to baseline of 2.29 with estimated GFR of 24.   MRI of brain revealed acute infarct in the right parietal lobe, stroke workup,  including carotid ultrasound was remarkable for 50-69% stenosis in the proximal left internal carotid artery, antegrade flow in vertebral arteries. Echocardiogram showed mild concentric hypertrophy. Systolic function was normal. The estimated ejection fraction was in the range of 60% to 65%. Wallmotion was normal; there were no  regional wall motion abnormalities. Doppler parameters are consistent with abnormal left ventricular relaxation (grade 1 diastolic dysfunction). - Aortic valve: There was mild regurgitation.- Aorta: Aortic root dimension: 37 mm (ED).- Aortic root: The aortic root was trivially dilated.- Left atrium: The atrium was mildly dilated.- Pulmonary arteries: Systolic pressure was mildly increased. PA peak pressure: 35 mm Hg (S). Renal ultrasound revealed status post right nephrectomy and left renal cysts, no hydro nephrosis. Patient's labs revealed mild elevation of troponin to maximum level of 0.07. Cardiologist was consulted, but no further evaluation was recommended, but follow-up as outpatient. Patient was also evaluated by neurologist and nephrologist and recommended follow-up as outpatient. Patient was seen by physical therapist and skilled nursing facility placement was recommended where patient will be discharged today. Discussion by problem #1. Acute on chronic renal failure, solitary kidney, status post right nephrectomy, underlying CK D stage IV, improved with IV fluid administrationthe baseline of 2.29 , Urinalysis was unremarkable, unlikely urinary tract infection. Appreciate nephrology's input , follow kidney function closely as outpatient #2 . Parkinson's disease, appreciate neurology input, no change in medications, appreciate physical therapy input, patient will be going to rehabilitation facility today  #3 acute stroke in the right parietal lobe, initial CT scan of head was unremarkable. However MRI of the brain revealed likely right parietal lobe acute infarct, continue patient on Plavix, Lipitor, appreciated neurology input, patient was evaluated by speech therapist and recommended dysphagia 3 diet with thin liquids, he was also evaluated by physical therapist and skilled nursing facility placement was recommended . Echocardiogram revealed normal  ejection fraction, abnormal left  ventricular relaxation, elevated pulmonary arterial pressures, carotid ultrasound showed 50-69% stenosis of proximal left internal carotid artery . Patient would benefit from vascular surgery evaluation as outpatient .  #4. Leukocytosis, likely dehydration, stress, resolved, not on any antibiotics. Chest x-ray revealed atelectasis at lung bases, urinalysis was unremarkable . Patient is asymptomatic, no infection found, white blood cell count normalized.  5. Elevated troponin, likely demand ischemia as patient had no symptoms, appreciate cardiologist input, echocardiogram was unremarkable, no further studies were recommended by cardiologist #6. Carotid disease, patient would benefit from vascular surgery evaluation as outpatient  DISCHARGE CONDITIONS:   Stable  CONSULTS OBTAINED:  Treatment Team:  Alexis Goodell, MD Lavonia Dana, MD Yolonda Kida, MD  DRUG ALLERGIES:   Allergies  Allergen Reactions  . Ace Inhibitors   . Lisinopril     Affects kidney  . Nsaids   . Wellbutrin [Bupropion]     confusion    DISCHARGE MEDICATIONS:   Current Discharge Medication List    START taking these medications   Details  atorvastatin (LIPITOR) 40 MG tablet Take 1 tablet (40 mg total) by mouth daily at 6 PM. Qty: 30 tablet, Refills: 5    bisacodyl (DULCOLAX) 10 MG suppository Place 1 suppository (10 mg total) rectally daily as needed for moderate constipation. Qty: 12 suppository, Refills: 0    docusate sodium (COLACE) 100 MG capsule Take 1 capsule (100 mg total) by mouth 2 (two) times daily. Qty: 10 capsule, Refills: 0    famotidine (PEPCID) 20 MG tablet Take 1 tablet (20 mg total) by mouth at bedtime. Qty: 30 tablet, Refills: 5    feeding supplement, ENSURE ENLIVE, (ENSURE ENLIVE) LIQD Take 237 mLs by mouth 2 (two) times daily between meals. Qty: 237 mL, Refills: 12    polyethylene glycol (MIRALAX / GLYCOLAX) packet Take 17 g by mouth daily. Qty: 14 each, Refills: 0       CONTINUE these medications which have NOT CHANGED   Details  carbidopa-levodopa (SINEMET IR) 25-250 MG tablet Take 1 tablet by mouth 3 (three) times daily. Qty: 270 tablet, Refills: 1   Associated Diagnoses: Parkinson's disease ([place])    Cholecalciferol (VITAMIN D3) 5000 UNITS CAPS Take by mouth daily.      clopidogrel (PLAVIX) 75 MG tablet TAKE 1 TABLET DAILY TO     PREVENT STROKE Qty: 90 tablet, Refills: 1    Fish Oil OIL Take by mouth daily. 1 tablespoon     ketoconazole (NIZORAL) 2 % shampoo     LOCOID 0.1 % LOTN     losartan (COZAAR) 100 MG tablet Take 1/2 to 1 tablet daily for BP as directed Qty: 30 tablet, Refills: 99    metoprolol succinate (TOPROL-XL) 25 MG 24 hr tablet 1/2-1 pill at night for blood pressure Qty: 90 tablet, Refills: 1   Associated Diagnoses: Essential hypertension; Coronary artery disease involving coronary bypass graft of native heart without angina pectoris    multivitamin (THERAGRAN) per tablet Take 1 tablet by mouth daily.      nitroGLYCERIN (NITROSTAT) 0.4 MG SL tablet Place 1 tablet (0.4 mg total) under the tongue every 5 (five) minutes as needed for chest pain. One tablet under tongue at onset of chest pain; may repeat every 5 minutes for up to 3 doses Qty: 30 tablet, Refills: PRN    vitamin C (ASCORBIC ACID) 500 MG tablet Take 500 mg by mouth daily.         DISCHARGE INSTRUCTIONS:  Patient is to follow-up with primary care physician, cardiologist, nephrologist, vascular surgeon  If you experience worsening of your admission symptoms, develop shortness of breath, life threatening emergency, suicidal or homicidal thoughts you must seek medical attention immediately by calling 911 or calling your MD immediately  if symptoms less severe.  You Must read complete instructions/literature along with all the possible adverse reactions/side effects for all the Medicines you take and that have been prescribed to you. Take any new Medicines after you  have completely understood and accept all the possible adverse reactions/side effects.   Please note  You were cared for by a hospitalist during your hospital stay. If you have any questions about your discharge medications or the care you received while you were in the hospital after you are discharged, you can call the unit and asked to speak with the hospitalist on call if the hospitalist that took care of you is not available. Once you are discharged, your primary care physician will handle any further medical issues. Please note that NO REFILLS for any discharge medications will be authorized once you are discharged, as it is imperative that you return to your primary care physician (or establish a relationship with a primary care physician if you do not have one) for your aftercare needs so that they can reassess your need for medications and monitor your lab values.    Today   CHIEF COMPLAINT:   Chief Complaint  Patient presents with  . Dizziness  . Gait Problem  . Hypertension    HISTORY OF PRESENT ILLNESS:  Gary Ortega  is a 82 y.o. male with a known history of Parkinson's disease, prior stroke who presents to the hospital with complaints of progressive weakness and ataxia. On arrival to the hospital. He was noted to be in acute renal failure with creatinine level of 3.06, he was given IV fluids and his condition improved. Patient's creatinine improved to baseline of 2.29 with estimated GFR of 24.   MRI of brain revealed acute infarct in the right parietal lobe, stroke workup,  including carotid ultrasound was remarkable for 50-69% stenosis in the proximal left internal carotid artery, antegrade flow in vertebral arteries. Echocardiogram showed mild concentric hypertrophy. Systolic function was normal. The estimated ejection fraction was in the range of 60% to 65%. Wallmotion was normal; there were no regional wall motion abnormalities. Doppler parameters are consistent with  abnormal left ventricular relaxation (grade 1 diastolic dysfunction). - Aortic valve: There was mild regurgitation.- Aorta: Aortic root dimension: 37 mm (ED).- Aortic root: The aortic root was trivially dilated.- Left atrium: The atrium was mildly dilated.- Pulmonary arteries: Systolic pressure was mildly increased. PA peak pressure: 35 mm Hg (S). Renal ultrasound revealed status post right nephrectomy and left renal cysts, no hydro nephrosis. Patient's labs revealed mild elevation of troponin to maximum level of 0.07. Cardiologist was consulted, but no further evaluation was recommended, but follow-up as outpatient. Patient was also evaluated by neurologist and nephrologist and recommended follow-up as outpatient. Patient was seen by physical therapist and skilled nursing facility placement was recommended where patient will be discharged today. Discussion by problem #1. Acute on chronic renal failure, solitary kidney, status post right nephrectomy, underlying CK D stage IV, improved with IV fluid administrationthe baseline of 2.29 , Urinalysis was unremarkable, unlikely urinary tract infection. Appreciate nephrology's input , follow kidney function closely as outpatient #2 . Parkinson's disease, appreciate neurology input, no change in medications, appreciate physical therapy input, patient will be going to  rehabilitation facility today  #3 acute stroke in the right parietal lobe, initial CT scan of head was unremarkable. However MRI of the brain revealed likely right parietal lobe acute infarct, continue patient on Plavix, Lipitor, appreciated neurology input, patient was evaluated by speech therapist and recommended dysphagia 3 diet with thin liquids, he was also evaluated by physical therapist and skilled nursing facility placement was recommended . Echocardiogram revealed normal ejection fraction, abnormal left ventricular relaxation, elevated pulmonary arterial pressures, carotid ultrasound  showed 50-69% stenosis of proximal left internal carotid artery . Patient would benefit from vascular surgery evaluation as outpatient .  #4. Leukocytosis, likely dehydration, stress, resolved, not on any antibiotics. Chest x-ray revealed atelectasis at lung bases, urinalysis was unremarkable . Patient is asymptomatic, no infection found, white blood cell count normalized.  5. Elevated troponin, likely demand ischemia as patient had no symptoms, appreciate cardiologist input, echocardiogram was unremarkable, no further studies were recommended by cardiologist #6. Carotid disease, patient would benefit from vascular surgery evaluation as outpatient    VITAL SIGNS:  Blood pressure 112/51, pulse 65, temperature 97.7 F (36.5 C), temperature source Oral, resp. rate 19, height 5\' 8"  (1.727 m), weight 76.34 kg (168 lb 4.8 oz), SpO2 95 %.  I/O:    Intake/Output Summary (Last 24 hours) at 09/15/15 1015 Last data filed at 09/15/15 [id number]  Gross per 24 hour  Intake    240 ml  Output   3575 ml  Net  -3335 ml    PHYSICAL EXAMINATION:  GENERAL:  82 y.o.-year-old patient lying in the bed with no acute distress.  EYES: Pupils equal, round, reactive to light and accommodation. No scleral icterus. Extraocular muscles intact.  HEENT: Head atraumatic, normocephalic. Oropharynx and nasopharynx clear.  NECK:  Supple, no jugular venous distention. No thyroid enlargement, no tenderness.  LUNGS: Normal breath sounds bilaterally, no wheezing, rales,rhonchi or crepitation. No use of accessory muscles of respiration.  CARDIOVASCULAR: S1, S2 normal. No murmurs, rubs, or gallops.  ABDOMEN: Soft, non-tender, non-distended. Bowel sounds present. No organomegaly or mass.  EXTREMITIES: No pedal edema, cyanosis, or clubbing.  NEUROLOGIC: Cranial nerves II through XII are intact. Muscle strength 5/5 in all extremities. Sensation intact. Gait not checked. Increased rigidity decrease in the right nasolabial fold,  increased tone in bilateral upper extremities, right greater than left. Rolling tremor was noted in both upper extremities, right greater than left PSYCHIATRIC: The patient is alert and oriented x 3.  SKIN: No obvious rash, lesion, or ulcer.   DATA REVIEW:   CBC  Recent Labs Lab 09/14/15 0228  WBC 9.7  HGB 11.9*  HCT 35.6*  PLT 207    Chemistries   Recent Labs Lab 09/09/15 1651  09/13/15 0503 09/14/15 0228  NA 136  < > 141 140  K 4.9  < > 4.3 4.1  CL 101  < > 111 113*  CO2 23  < > 22 19*  GLUCOSE 111*  < > 112* 98  BUN 54*  < > 42* 40*  CREATININE 3.06*  < > 2.33* 2.29*  CALCIUM 9.6  < > 8.8* 8.2*  MG 2.5  --   --   --   AST 19  < > 25  --   ALT 5*  < > 19  --   ALKPHOS 58  < > 49  --   BILITOT 0.3  < > 0.7  --   < > = values in this interval not displayed.  Cardiac Enzymes  Recent Labs Lab  09/14/15 0228  TROPONINI 0.06*    Microbiology Results  Results for orders placed or performed in visit on 09/09/15  Urine culture     Status: None   Collection Time: 09/09/15  4:49 PM  Result Value Ref Range Status   Colony Count 10,000 COLONIES/ML  Final   Organism ID, Bacteria Multiple bacterial morphotypes present, none  Final   Organism ID, Bacteria predominant. Suggest appropriate recollection if   Final   Organism ID, Bacteria clinically indicated.  Final    RADIOLOGY:  US Renal  09/13/2015  CLINICAL DATA:  Acute renal failure. History of prostate cancer. History of nephrectomy. EXAM: RENAL / URINARY TRACT ULTRASOUND COMPLETE COMPARISON:  CT of the abdomen and pelvis 05/17/2015 FINDINGS: Right Kidney: Length: Surgically absent. Left Kidney: Length: 11.9 cm. Midpole cyst is 1.7 x 1.5 x 1.3 cm. Lower pole cyst is 2.1 x 2.0 x 2.1 cm. Bladder: Appears normal for degree of bladder distention. Left ureteral jet is normal in appearance. IMPRESSION: 1. Status post right nephrectomy. 2. Left renal cysts.  No hydronephrosis. Electronically Signed   By: Nolon Nations M.D.    On: 09/13/2015 17:49   US Carotid Bilateral  09/13/2015  CLINICAL DATA:  Stroke. EXAM: BILATERAL CAROTID DUPLEX ULTRASOUND TECHNIQUE: Pearline Cables scale imaging, color Doppler and duplex ultrasound were performed of bilateral carotid and vertebral arteries in the neck. COMPARISON:  CT and MRI 09/13/2015 FINDINGS: Criteria: Quantification of carotid stenosis is based on velocity parameters that correlate the residual internal carotid diameter with NASCET-based stenosis levels, using the diameter of the distal internal carotid lumen as the denominator for stenosis measurement. The following velocity measurements were obtained: RIGHT ICA:  79 cm/sec CCA:  98 cm/sec SYSTOLIC ICA/CCA RATIO:  0.8 DIASTOLIC ICA/CCA RATIO:  1.8 ECA:  180 cm/sec LEFT ICA:  150 cm/sec CCA:  [id number] cm/sec SYSTOLIC ICA/CCA RATIO:  1.4 DIASTOLIC ICA/CCA RATIO:  1.9 ECA:  217 cm/sec RIGHT CAROTID ARTERY: Mild calcified plaque at the right carotid bulb. No significant plaque within the internal carotid artery. Normal Doppler waveforms. RIGHT VERTEBRAL ARTERY:  Normal antegrade flow. LEFT CAROTID ARTERY: Moderate homogeneous soft plaque at the left carotid bulb and origin of the left internal carotid artery. Normal Doppler waveforms. LEFT VERTEBRAL ARTERY:  Normal antegrade flow. IMPRESSION: Moderate plaque at the left carotid bulb and proximal internal carotid artery. Velocity measurements compatible with 50-69% stenosis of the proximal left internal carotid artery. No significant plaque or hemodynamically significant stenosis of the right internal carotid artery. Normal antegrade flow within the vertebral arteries. Electronically Signed   By: Marin Olp M.D.   On: 09/13/2015 17:54    EKG:   Orders placed or performed during the hospital encounter of 09/12/15  . ED EKG  . ED EKG      Management plans discussed with the patient, family and they are in agreement.  CODE STATUS:     Code Status Orders        Start     Ordered    09/13/15 0132  Full code   Continuous     09/13/15 0131    Code Status History    Date Active Date Inactive Code Status Order ID Comments User Context   This patient has a current code status but no historical code status.      TOTAL TIME TAKING CARE OF THIS PATIENT: 40 minutes.    Theodoro Grist M.D on 09/15/2015 at 10:15 AM  Between 7am to 6pm - Pager - [phone]  After 6pm  go to [email or web address] - password [email or web address]  [facility]  Office  [phone]  CC: Primary care physician; Alesia Richards, MD

## 2015-09-15 NOTE — Clinical Social Work Note (Signed)
Pt is ready for discharge today to [name]. Facility has received discharge information and is ready to admit pt. Pt's daughter is awsare and agreeable to discharge plan. RN to call report and EMS will provide transportation. CSW is signing off as no further needs identified.   Darden Dates, MSW, LCSW  Clinical Social Worker [phone]

## 2015-09-15 NOTE — Progress Notes (Addendum)
Subjective:  Doing better No acute c/o   Objective:  Vital signs in last 24 hours:  Temp:  [97.5 F (36.4 C)-98 F (36.7 C)] 97.7 F (36.5 C) (02/21 0658) Pulse Rate:  [65-77] 65 (02/21 0658) Resp:  [18-19] 19 (02/21 0658) BP: (112-182)/(51-75) 112/51 mmHg (02/21 0658) SpO2:  [93 %-100 %] 95 % (02/21 0658) Weight:  [76.34 kg (168 lb 4.8 oz)] 76.34 kg (168 lb 4.8 oz) (02/21 0500)  Weight change: -1.814 kg (-4 lb) Filed Weights   09/13/15 0127 09/14/15 0500 09/15/15 0500  Weight: 76.295 kg (168 lb 3.2 oz) 78.155 kg (172 lb 4.8 oz) 76.34 kg (168 lb 4.8 oz)    Intake/Output:    Intake/Output Summary (Last 24 hours) at 09/15/15 1123 Last data filed at 09/15/15 0900  Gross per 24 hour  Intake    480 ml  Output   3325 ml  Net  -2845 ml     Physical Exam: General: NAD laying in bed  HEENT Eyes closed, moist oral mucus membranes  Neck supple  Pulm/lungs Clear   CVS/Heart No rub  Abdomen:  Sogt, NT  Extremities: No edema  Neurologic: Resting quietly  Skin: No acute rashes  Access:        Basic Metabolic Panel:   Recent Labs Lab 09/09/15 1651 09/12/15 2041 09/13/15 0503 09/14/15 0228  NA 136 135 141 140  K 4.9 4.7 4.3 4.1  CL 101 103 111 113*  CO2 23 23 22  19*  GLUCOSE 111* 108* 112* 98  BUN 54* 47* 42* 40*  CREATININE 3.06* 2.52* 2.33* 2.29*  CALCIUM 9.6 9.3 8.8* 8.2*  MG 2.5  --   --   --      CBC:  Recent Labs Lab 09/09/15 1651 09/12/15 2041 09/13/15 0503 09/14/15 0228  WBC 8.3 10.2 10.9* 9.7  NEUTROABS 5.9 8.0*  --   --   HGB 13.6 13.7 12.6* 11.9*  HCT 41.3 41.2 37.2* 35.6*  MCV 89.2 87.3 89.3 89.1  PLT 258 247 220 207      Microbiology:  Recent Results (from the past 720 hour(s))  Urine culture     Status: None   Collection Time: 09/09/15  4:49 PM  Result Value Ref Range Status   Colony Count 10,000 COLONIES/ML  Final   Organism ID, Bacteria Multiple bacterial morphotypes present, none  Final   Organism ID, Bacteria  predominant. Suggest appropriate recollection if   Final   Organism ID, Bacteria clinically indicated.  Final    Coagulation Studies: No results for input(s): LABPROT, INR in the last 72 hours.  Urinalysis:  Recent Labs  09/12/15 2041  COLORURINE STRAW*  LABSPEC 1.004*  PHURINE 6.0  GLUCOSEU NEGATIVE  HGBUR 1+*  BILIRUBINUR NEGATIVE  KETONESUR NEGATIVE  PROTEINUR 100*  NITRITE NEGATIVE  LEUKOCYTESUR NEGATIVE      Imaging: US Renal  09/13/2015  CLINICAL DATA:  Acute renal failure. History of prostate cancer. History of nephrectomy. EXAM: RENAL / URINARY TRACT ULTRASOUND COMPLETE COMPARISON:  CT of the abdomen and pelvis 05/17/2015 FINDINGS: Right Kidney: Length: Surgically absent. Left Kidney: Length: 11.9 cm. Midpole cyst is 1.7 x 1.5 x 1.3 cm. Lower pole cyst is 2.1 x 2.0 x 2.1 cm. Bladder: Appears normal for degree of bladder distention. Left ureteral jet is normal in appearance. IMPRESSION: 1. Status post right nephrectomy. 2. Left renal cysts.  No hydronephrosis. Electronically Signed   By: Nolon Nations M.D.   On: 09/13/2015 17:49   US Carotid Bilateral  09/13/2015  CLINICAL DATA:  Stroke. EXAM: BILATERAL CAROTID DUPLEX ULTRASOUND TECHNIQUE: Pearline Cables scale imaging, color Doppler and duplex ultrasound were performed of bilateral carotid and vertebral arteries in the neck. COMPARISON:  CT and MRI 09/13/2015 FINDINGS: Criteria: Quantification of carotid stenosis is based on velocity parameters that correlate the residual internal carotid diameter with NASCET-based stenosis levels, using the diameter of the distal internal carotid lumen as the denominator for stenosis measurement. The following velocity measurements were obtained: RIGHT ICA:  79 cm/sec CCA:  98 cm/sec SYSTOLIC ICA/CCA RATIO:  0.8 DIASTOLIC ICA/CCA RATIO:  1.8 ECA:  180 cm/sec LEFT ICA:  150 cm/sec CCA:  [id number] cm/sec SYSTOLIC ICA/CCA RATIO:  1.4 DIASTOLIC ICA/CCA RATIO:  1.9 ECA:  217 cm/sec RIGHT CAROTID ARTERY: Mild  calcified plaque at the right carotid bulb. No significant plaque within the internal carotid artery. Normal Doppler waveforms. RIGHT VERTEBRAL ARTERY:  Normal antegrade flow. LEFT CAROTID ARTERY: Moderate homogeneous soft plaque at the left carotid bulb and origin of the left internal carotid artery. Normal Doppler waveforms. LEFT VERTEBRAL ARTERY:  Normal antegrade flow. IMPRESSION: Moderate plaque at the left carotid bulb and proximal internal carotid artery. Velocity measurements compatible with 50-69% stenosis of the proximal left internal carotid artery. No significant plaque or hemodynamically significant stenosis of the right internal carotid artery. Normal antegrade flow within the vertebral arteries. Electronically Signed   By: Marin Olp M.D.   On: 09/13/2015 17:54     Medications:     . atorvastatin  40 mg Oral q1800  . carbidopa-levodopa  1 tablet Oral TID  . clopidogrel  75 mg Oral Daily  . docusate sodium  100 mg Oral BID  . famotidine  20 mg Oral QHS  . feeding supplement (ENSURE ENLIVE)  237 mL Oral BID BM  . heparin  5,000 Units Subcutaneous 3 times per day  . labetalol  100 mg Oral BID  . multivitamin with minerals  1 tablet Oral Q supper  . polyethylene glycol  17 g Oral Daily   acetaminophen **OR** acetaminophen, bisacodyl, nitroGLYCERIN, ondansetron **OR** ondansetron (ZOFRAN) IV  Assessment/ Plan:  82 y.o. male with a PMHX of Parkinson's disease, prior stroke 2012, BPH, CKD, rt nephrectomy 08/2010 for benign renal mass, prostate cancer 2008 - seed implants, staph infection after cataract surgery 2008, appendectomy 1940's, rt jaw # at age 30 was admitted on 09/12/2015 with generalized weakness and ataxia.   1. Acute renal failure on chronic kidney disease stage IV. Patient has history of right nephrectomy FEB 2012 past for a benign renal mass - Baseline creatinine 2.29/GFR 24 - over the last few days creatinine has been improving - we will consider ACE inhibitor or  ARB as an outpatient - f/u with PMD  2. Left renal cysts - Observation  3. HTN - better control with labetalol   LOS: 3 Lasasha Brophy 2/21/201711:23 AM

## 2015-09-15 NOTE — Clinical Social Work Placement (Signed)
   CLINICAL SOCIAL WORK PLACEMENT  NOTE  Date:  09/15/2015  Patient Details  Name: Gary Ortega MRN: [id number] Date of Birth: 25-Jun-1925  Clinical Social Work is seeking post-discharge placement for this patient at the [place] level of care (*CSW will initial, date and re-position this form in  chart as items are completed):  Yes   Patient/family provided with [facility] Work Department's list of facilities offering this level of care within the geographic area requested by the patient (or if unable, by the patient's family).  Yes   Patient/family informed of their freedom to choose among providers that offer the needed level of care, that participate in Medicare, Medicaid or managed care program needed by the patient, have an available bed and are willing to accept the patient.  Yes   Patient/family informed of [facility]'s ownership interest in [facility] and [facility], as well as of the fact that they are under no obligation to receive care at these facilities.  PASRR submitted to EDS on 09/13/15     PASRR number received on 09/13/15     Existing PASRR number confirmed on       FL2 transmitted to all facilities in geographic area requested by pt/family on 09/13/15     FL2 transmitted to all facilities within larger geographic area on       Patient informed that his/her managed care company has contracts with or will negotiate with certain facilities, including the following:        Yes   Patient/family informed of bed offers received.  Patient chooses bed at [facility]     Physician recommends and patient chooses bed at  [facility])    Patient to be transferred to [facility] on 09/15/15.  Patient to be transferred to facility by pt's daughter      Patient family notified on 09/15/15 of transfer.  Name of family member notified:  Camielle, daughter     PHYSICIAN       Additional Comment:     _______________________________________________ Darden Dates, LCSW 09/15/2015, 1:59 PM

## 2015-09-18 DIAGNOSIS — I2510 Atherosclerotic heart disease of native coronary artery without angina pectoris: Secondary | ICD-10-CM | POA: Diagnosis not present

## 2015-09-18 DIAGNOSIS — I6350 Cerebral infarction due to unspecified occlusion or stenosis of unspecified cerebral artery: Secondary | ICD-10-CM | POA: Diagnosis not present

## 2015-09-18 DIAGNOSIS — G20 Parkinson's disease: Secondary | ICD-10-CM | POA: Diagnosis not present

## 2015-09-18 LAB — ACETYLCHOLINE RECEPTOR AB, ALL
Acety choline binding ab: <0.03 nmol/L (ref 0.00–0.24)
Acetylchol Block Ab: 14 % (ref 0–25)
Acetylcholine Modulat Ab: <12 % (ref 0–20)

## 2015-09-20 DIAGNOSIS — I639 Cerebral infarction, unspecified: Secondary | ICD-10-CM | POA: Diagnosis not present

## 2015-09-20 DIAGNOSIS — N390 Urinary tract infection, site not specified: Secondary | ICD-10-CM | POA: Diagnosis not present

## 2015-09-20 DIAGNOSIS — I10 Essential (primary) hypertension: Secondary | ICD-10-CM | POA: Diagnosis not present

## 2015-09-29 DIAGNOSIS — L853 Xerosis cutis: Secondary | ICD-10-CM | POA: Diagnosis not present

## 2015-09-29 DIAGNOSIS — I639 Cerebral infarction, unspecified: Secondary | ICD-10-CM | POA: Diagnosis not present

## 2015-09-29 DIAGNOSIS — I10 Essential (primary) hypertension: Secondary | ICD-10-CM | POA: Diagnosis not present

## 2015-10-02 ENCOUNTER — Emergency Department: Payer: Medicare Other

## 2015-10-02 ENCOUNTER — Encounter: Payer: Self-pay | Admitting: Emergency Medicine

## 2015-10-02 ENCOUNTER — Emergency Department
Admission: EM | Admit: 2015-10-02 | Discharge: 2015-10-02 | Disposition: A | Discharge status: Home / Self Care | Payer: Medicare Other | Attending: Emergency Medicine | Admitting: Emergency Medicine

## 2015-10-02 DIAGNOSIS — W19XXXA Unspecified fall, initial encounter: Secondary | ICD-10-CM | POA: Diagnosis not present

## 2015-10-02 DIAGNOSIS — Y998 Other external cause status: Secondary | ICD-10-CM | POA: Insufficient documentation

## 2015-10-02 DIAGNOSIS — Z043 Encounter for examination and observation following other accident: Secondary | ICD-10-CM | POA: Diagnosis not present

## 2015-10-02 DIAGNOSIS — R6889 Other general symptoms and signs: Secondary | ICD-10-CM | POA: Diagnosis not present

## 2015-10-02 DIAGNOSIS — Z7902 Long term (current) use of antithrombotics/antiplatelets: Secondary | ICD-10-CM | POA: Diagnosis not present

## 2015-10-02 DIAGNOSIS — I129 Hypertensive chronic kidney disease with stage 1 through stage 4 chronic kidney disease, or unspecified chronic kidney disease: Secondary | ICD-10-CM | POA: Insufficient documentation

## 2015-10-02 DIAGNOSIS — Z79899 Other long term (current) drug therapy: Secondary | ICD-10-CM | POA: Diagnosis not present

## 2015-10-02 DIAGNOSIS — N184 Chronic kidney disease, stage 4 (severe): Secondary | ICD-10-CM | POA: Diagnosis not present

## 2015-10-02 DIAGNOSIS — R42 Dizziness and giddiness: Secondary | ICD-10-CM | POA: Insufficient documentation

## 2015-10-02 DIAGNOSIS — W1839XA Other fall on same level, initial encounter: Secondary | ICD-10-CM | POA: Insufficient documentation

## 2015-10-02 DIAGNOSIS — Y92121 Bathroom in nursing home as the place of occurrence of the external cause: Secondary | ICD-10-CM | POA: Diagnosis not present

## 2015-10-02 DIAGNOSIS — Y9389 Activity, other specified: Secondary | ICD-10-CM | POA: Insufficient documentation

## 2015-10-02 DIAGNOSIS — S0990XA Unspecified injury of head, initial encounter: Secondary | ICD-10-CM | POA: Diagnosis not present

## 2015-10-02 DIAGNOSIS — E1122 Type 2 diabetes mellitus with diabetic chronic kidney disease: Secondary | ICD-10-CM | POA: Insufficient documentation

## 2015-10-02 LAB — COMPREHENSIVE METABOLIC PANEL
ALBUMIN: 3.8 g/dL (ref 3.5–5.0)
ALK PHOS: 54 U/L (ref 38–126)
ALT: 5 U/L — ABNORMAL LOW (ref 17–63)
AST: 26 U/L (ref 15–41)
Anion gap: 8 (ref 5–15)
BILIRUBIN TOTAL: 0.7 mg/dL (ref 0.3–1.2)
BUN: 42 mg/dL — AB (ref 6–20)
CHLORIDE: 102 mmol/L (ref 101–111)
CO2: 25 mmol/L (ref 22–32)
Calcium: 9.2 mg/dL (ref 8.9–10.3)
Creatinine, Ser: 2.62 mg/dL — ABNORMAL HIGH (ref 0.61–1.24)
GFR calc Af Amer: 24 mL/min — ABNORMAL LOW (ref >60)
GFR calc non Af Amer: 20 mL/min — ABNORMAL LOW (ref >60)
GLUCOSE: 103 mg/dL — AB (ref 65–99)
POTASSIUM: 4.1 mmol/L (ref 3.5–5.1)
SODIUM: 135 mmol/L (ref 135–145)
Total Protein: 7.1 g/dL (ref 6.5–8.1)

## 2015-10-02 LAB — TROPONIN I: Troponin I: <0.03 ng/mL (ref <0.031)

## 2015-10-02 LAB — URINALYSIS COMPLETE WITH MICROSCOPIC (ARMC ONLY)
BACTERIA UA: NONE SEEN
BILIRUBIN URINE: NEGATIVE
GLUCOSE, UA: NEGATIVE mg/dL
Ketones, ur: NEGATIVE mg/dL
Leukocytes, UA: NEGATIVE
Nitrite: NEGATIVE
Protein, ur: NEGATIVE mg/dL
SQUAMOUS EPITHELIAL / LPF: NONE SEEN
Specific Gravity, Urine: 1.004 — ABNORMAL LOW (ref 1.005–1.030)
pH: 6 (ref 5.0–8.0)

## 2015-10-02 LAB — CBC
HCT: 39.3 % — ABNORMAL LOW (ref 40.0–52.0)
Hemoglobin: 13.2 g/dL (ref 13.0–18.0)
MCH: 30.1 pg (ref 26.0–34.0)
MCHC: 33.7 g/dL (ref 32.0–36.0)
MCV: 89.1 fL (ref 80.0–100.0)
PLATELETS: 245 10*3/uL (ref 150–440)
RBC: 4.4 MIL/uL (ref 4.40–5.90)
RDW: 14.3 % (ref 11.5–14.5)
WBC: 10.1 10*3/uL (ref 3.8–10.6)

## 2015-10-02 MED ORDER — SODIUM CHLORIDE 0.9 % IV BOLUS (SEPSIS)
1000.0000 mL | Freq: Once | INTRAVENOUS | Status: AC
  Administered 2015-10-02: 1000 mL via INTRAVENOUS

## 2015-10-02 NOTE — ED Notes (Signed)
Pt arrived by EMS from [name] after family expressed concerns about the pt  falling after breakfast this morning. Staff at [name] obtained a BP of 105/58. EMS obtained a BP of 164/76. Daughter told EMS that she is concerned pt is dehydrated because of hx.

## 2015-10-02 NOTE — ED Provider Notes (Signed)
[facility] Emergency Department Provider Note    ____________________________________________  Time seen:~1905  I have reviewed the triage vital signs and the nursing notes.   HISTORY  Chief Complaint Fall   History limited by: Not Limited, some history obtained from daughter   HPI Gary Ortega is a 82 y.o. male who presents to the emergency department from living facility today after a fall. The patient states this happened around 11 AM. He was in the bathroom. He fell backwards. He didn't strike his head although did not have any loss of consciousness. Is not currently complaining of any pain. He has been dealing with issues of balance and dizziness. Had a recent hospitalization for the same. Daughter states that many times when this happens stated dehydration. He has notany fevers lately. No chest pain or shortness of breath.   Past Medical History  Diagnosis Date  . BPH (benign prostatic hypertrophy) with urinary obstruction   . CVA (cerebral infarction)   . Hypertension   . Parkinson disease ([place])   . Renal disorder   . Stroke ([place])   . Cancer [facility])     Patient Active Problem List   Diagnosis Date Noted  . Solitary kidney 09/15/2015  . S/p nephrectomy 09/15/2015  . Chronic kidney disease (CKD), stage IV (severe) ([place]) 09/15/2015  . Leukocytosis 09/15/2015  . Elevated troponin 09/15/2015  . Dysphagia 09/15/2015  . Carotid artery disease ([place]) 09/15/2015  . Acute cerebral infarction ([place])   . Ataxia 09/12/2015  . Weakness generalized 09/12/2015  . ARF (acute renal failure) ([place]) 09/12/2015  . Dehydration 09/12/2015  . ASHD  04/02/2015  . Depression, controlled 01/18/2015  . T2_NIDDM w/ CKD 4 (GFR 26 ml/min) 01/15/2015  . History of nephrectomy, unilateral 10/07/2014  . Parkinson's disease ([place]) 03/27/2014  . Medication management 11/29/2013  . BPH (benign prostatic hypertrophy) with urinary obstruction   . TIA (transient  ischemic attack) 06/30/2011  . Hyperlipidemia 06/03/2010  . Vitamin D deficiency 06/02/2010  . Essential hypertension 06/02/2010    Past Surgical History  Procedure Laterality Date  . Colonoscopy    . Cystoscopy      prostate radioactive I-125 seed impklantation   . Mandible fracture surgery  1948  . Appendectomy    . Hemorrhoid surgery    . Cataract extraction    . Total nephrectomy Right 2012    Current Outpatient Rx  Name  Route  Sig  Dispense  Refill  . atorvastatin (LIPITOR) 40 MG tablet   Oral   Take 1 tablet (40 mg total) by mouth daily at 6 PM.   30 tablet   5   . bisacodyl (DULCOLAX) 10 MG suppository   Rectal   Place 1 suppository (10 mg total) rectally daily as needed for moderate constipation.   12 suppository   0   . carbidopa-levodopa (SINEMET IR) 25-250 MG tablet   Oral   Take 1 tablet by mouth 3 (three) times daily.   270 tablet   1   . Cholecalciferol (VITAMIN D3) 5000 UNITS CAPS   Oral   Take by mouth daily.           . clopidogrel (PLAVIX) 75 MG tablet      TAKE 1 TABLET DAILY TO     PREVENT STROKE   90 tablet   1   . docusate sodium (COLACE) 100 MG capsule   Oral   Take 1 capsule (100 mg total) by mouth 2 (two) times daily.  10 capsule   0   . famotidine (PEPCID) 20 MG tablet   Oral   Take 1 tablet (20 mg total) by mouth at bedtime.   30 tablet   5   . feeding supplement, ENSURE ENLIVE, (ENSURE ENLIVE) LIQD   Oral   Take 237 mLs by mouth 2 (two) times daily between meals.   237 mL   12   . Fish Oil OIL   Oral   Take by mouth daily. 1 tablespoon          . ketoconazole (NIZORAL) 2 % shampoo               . LOCOID 0.1 % LOTN                 Dispense as written.   Marland Kitchen losartan (COZAAR) 100 MG tablet      Take 1/2 to 1 tablet daily for BP as directed   30 tablet   99   . metoprolol succinate (TOPROL-XL) 25 MG 24 hr tablet      1/2-1 pill at night for blood pressure   90 tablet   1   . multivitamin  (THERAGRAN) per tablet   Oral   Take 1 tablet by mouth daily.           . nitroGLYCERIN (NITROSTAT) 0.4 MG SL tablet   Sublingual   Place 1 tablet (0.4 mg total) under the tongue every 5 (five) minutes as needed for chest pain. One tablet under tongue at onset of chest pain; may repeat every 5 minutes for up to 3 doses   30 tablet   PRN   . polyethylene glycol (MIRALAX / GLYCOLAX) packet   Oral   Take 17 g by mouth daily.   14 each   0   . vitamin C (ASCORBIC ACID) 500 MG tablet   Oral   Take 500 mg by mouth daily.           Allergies Ace inhibitors; Lisinopril; Nsaids; and Wellbutrin  Family History  Problem Relation Age of Onset  . Stroke Mother   . Hypertension Mother   . Stroke Father   . Hypertension Brother     Social History Social History  Substance Use Topics  . Smoking status: Never Smoker   . Smokeless tobacco: None  . Alcohol Use: No    Review of Systems  Constitutional: Negative for fever. Cardiovascular: Negative for chest pain. Respiratory: Negative for shortness of breath. Gastrointestinal: Negative for abdominal pain, vomiting and diarrhea. Neurological: Negative for headaches, focal weakness or numbness.  10-point ROS otherwise negative.  ____________________________________________   PHYSICAL EXAM:  VITAL SIGNS: ED Triage Vitals  Enc Vitals Group     BP 10/02/15 1808 129/65 mmHg     Pulse Rate 10/02/15 1808 61     Resp 10/02/15 1808 16     Temp 10/02/15 1808 97.6 F (36.4 C)     Temp Source 10/02/15 1808 Oral     SpO2 10/02/15 1723 96 %     Weight 10/02/15 1729 160 lb (72.576 kg)     Height 10/02/15 1729 5\' 9"  (1.753 m)     Head Cir --      Peak Flow --      Pain Score 10/02/15 1731 0   Constitutional: Alert and oriented. Well appearing and in no distress. Eyes: Conjunctivae are normal. PERRL. Normal extraocular movements. ENT   Head: Normocephalic and atraumatic.   Nose: No congestion/rhinnorhea.  Mouth/Throat: Mucous membranes are moist.   Neck: No stridor. Hematological/Lymphatic/Immunilogical: No cervical lymphadenopathy. Cardiovascular: Normal rate, regular rhythm.  No murmurs, rubs, or gallops. Respiratory: Normal respiratory effort without tachypnea nor retractions. Breath sounds are clear and equal bilaterally. No wheezes/rales/rhonchi. Gastrointestinal: Soft and nontender. No distention.  Genitourinary: Deferred Musculoskeletal: Normal range of motion in all extremities. No joint effusions.  No lower extremity tenderness nor edema. Neurologic:  Normal speech and language. No gross focal neurologic deficits are appreciated.  Skin:  Skin is warm, dry and intact. No rash noted. Psychiatric: Mood and affect are normal. Speech and behavior are normal. Patient exhibits appropriate insight and judgment.  ____________________________________________    LABS (pertinent positives/negatives)  Labs Reviewed  COMPREHENSIVE METABOLIC PANEL - Abnormal; Notable for the following:    Glucose, Bld 103 (*)    BUN 42 (*)    Creatinine, Ser 2.62 (*)    ALT 5 (*)    GFR calc non Af Amer 20 (*)    GFR calc Af Amer 24 (*)    All other components within normal limits  CBC - Abnormal; Notable for the following:    HCT 39.3 (*)    All other components within normal limits  URINALYSIS COMPLETEWITH MICROSCOPIC ([facility] ONLY) - Abnormal; Notable for the following:    Color, Urine STRAW (*)    APPearance CLEAR (*)    Specific Gravity, Urine 1.004 (*)    Hgb urine dipstick 1+ (*)    All other components within normal limits  TROPONIN I    ____________________________________________   EKG  I, Nance Pear, attending physician, personally viewed and interpreted this EKG  EKG Time: 1743 Rate: 61 Rhythm: normal sinus rhythm Axis: left axis deviation Intervals: qtc 477 QRS: LVH ST changes: no st elevation Impression: abnormal ekg ____________________________________________     RADIOLOGY  CT head IMPRESSION: No evidence of acute intracranial abnormality.  Old right cerebellar infarct. Atrophy with extensive small vessel ischemic changes. Secondary ventriculomegaly.  Stable calcified right parafalcine meningioma.  ____________________________________________   PROCEDURES  Procedure(s) performed: None  Critical Care performed: No  ____________________________________________   INITIAL IMPRESSION / ASSESSMENT AND PLAN / ED COURSE  Pertinent labs & imaging results that were available during my care of the patient were reviewed by me and considered in my medical decision making (see chart for details).  Patient presented to the emergency department today after a fall. Patient was not complaining of any traumatic injuries however head CT was obtained given the patient did hit his head. This did not show any acute findings. Patient's blood work and urine were also checked without any concerning findings. EKG without any acute ischemia. Patient does have a history of some disc balance. Was admitted to the hospital last month for similar symptoms. At that time patient was also found to have an acute stroke as well as dehydration. Patient was given fluid boluses here and stated he felt better afterwards. Patient is on medical therapy for strokes and that this point I do not think stroke likely given no new neuro findings. In addition patient did have a recent workup. Will plan on discharging patient back to living facility. Patient and daughter comfortable with plan.  ____________________________________________   FINAL CLINICAL IMPRESSION(S) / ED DIAGNOSES  Final diagnoses:  Fall, initial encounter  Dizziness     Nance Pear, MD 10/02/15 2353

## 2015-10-02 NOTE — Discharge Instructions (Signed)
Please seek medical attention for any high fevers, chest pain, shortness of breath, change in behavior, persistent vomiting, bloody stool or any other new or concerning symptoms.   Dizziness Dizziness is a common problem. It makes you feel unsteady or lightheaded. You may feel like you are about to pass out (faint). Dizziness can lead to injury if you stumble or fall. Anyone can get dizzy, but dizziness is more common in older adults. This condition can be caused by a number of things, including:  Medicines.  Dehydration.  Illness. HOME CARE Following these instructions may help with your condition: Eating and Drinking  Drink enough fluid to keep your pee (urine) clear or pale yellow. This helps to keep you from getting dehydrated. Try to drink more clear fluids, such as water.  Do not drink alcohol.  Limit how much caffeine you drink or eat if told by your doctor.  Limit how much salt you drink or eat if told by your doctor. Activity  Avoid making quick movements.  When you stand up from sitting in a chair, steady yourself until you feel okay.  In the morning, first sit up on the side of the bed. When you feel okay, stand slowly while you hold onto something. Do this until you know that your balance is fine.  Move your legs often if you need to stand in one place for a long time. Tighten and relax your muscles in your legs while you are standing.  Do not drive or use heavy machinery if you feel dizzy.  Avoid bending down if you feel dizzy. Place items in your home so that they are easy for you to reach without leaning over. Lifestyle  Do not use any tobacco products, including cigarettes, chewing tobacco, or electronic cigarettes. If you need help quitting, ask your doctor.  Try to lower your stress level, such as with yoga or meditation. Talk with your doctor if you need help. General Instructions  Watch your dizziness for any changes.  Take medicines only as told by  your doctor. Talk with your doctor if you think that your dizziness is caused by a medicine that you are taking.  Tell a friend or a family member that you are feeling dizzy. If he or she notices any changes in your behavior, have this person call your doctor.  Keep all follow-up visits as told by your doctor. This is important. GET HELP IF:  Your dizziness does not go away.  Your dizziness or light-headedness gets worse.  You feel sick to your stomach (nauseous).  You have trouble hearing.  You have new symptoms.  You are unsteady on your feet or you feel like the room is spinning. GET HELP RIGHT AWAY IF:  You throw up (vomit) or have diarrhea and are unable to eat or drink anything.  You have trouble:  Talking.  Walking.  Swallowing.  Using your arms, hands, or legs.  You feel generally weak.  You are not thinking clearly or you have trouble forming sentences. It may take a friend or family member to notice this.  You have:  Chest pain.  Pain in your belly (abdomen).  Shortness of breath.  Sweating.  Your vision changes.  You are bleeding.  You have a headache.  You have neck pain or a stiff neck.  You have a fever.   This information is not intended to replace advice given to you by your health care provider. Make sure you discuss any questions you  have with your health care provider.   Document Released: 06/30/2011 Document Revised: 11/25/2014 Document Reviewed: 07/07/2014 Elsevier Interactive Patient Education [employer].

## 2015-10-05 DIAGNOSIS — R8290 Unspecified abnormal findings in urine: Secondary | ICD-10-CM | POA: Diagnosis not present

## 2015-10-06 ENCOUNTER — Ambulatory Visit: Payer: Self-pay | Admitting: Internal Medicine

## 2015-10-07 DIAGNOSIS — G4700 Insomnia, unspecified: Secondary | ICD-10-CM | POA: Diagnosis not present

## 2015-10-07 DIAGNOSIS — R609 Edema, unspecified: Secondary | ICD-10-CM | POA: Diagnosis not present

## 2015-10-07 DIAGNOSIS — R0989 Other specified symptoms and signs involving the circulatory and respiratory systems: Secondary | ICD-10-CM | POA: Diagnosis not present

## 2015-10-08 ENCOUNTER — Ambulatory Visit: Payer: Self-pay | Admitting: Internal Medicine

## 2015-10-08 DIAGNOSIS — Z79899 Other long term (current) drug therapy: Secondary | ICD-10-CM | POA: Diagnosis not present

## 2015-10-08 DIAGNOSIS — E785 Hyperlipidemia, unspecified: Secondary | ICD-10-CM | POA: Diagnosis not present

## 2015-10-14 ENCOUNTER — Emergency Department: Payer: Medicare Other

## 2015-10-14 ENCOUNTER — Observation Stay
Admission: EM | Admit: 2015-10-14 | Discharge: 2015-10-16 | Payer: Medicare Other | Attending: Internal Medicine | Admitting: Internal Medicine

## 2015-10-14 ENCOUNTER — Encounter: Payer: Self-pay | Admitting: Internal Medicine

## 2015-10-14 DIAGNOSIS — R270 Ataxia, unspecified: Secondary | ICD-10-CM | POA: Diagnosis not present

## 2015-10-14 DIAGNOSIS — R1310 Dysphagia, unspecified: Secondary | ICD-10-CM | POA: Insufficient documentation

## 2015-10-14 DIAGNOSIS — B349 Viral infection, unspecified: Principal | ICD-10-CM | POA: Insufficient documentation

## 2015-10-14 DIAGNOSIS — J9811 Atelectasis: Secondary | ICD-10-CM | POA: Diagnosis not present

## 2015-10-14 DIAGNOSIS — Z888 Allergy status to other drugs, medicaments and biological substances status: Secondary | ICD-10-CM | POA: Diagnosis not present

## 2015-10-14 DIAGNOSIS — Z8719 Personal history of other diseases of the digestive system: Secondary | ICD-10-CM | POA: Insufficient documentation

## 2015-10-14 DIAGNOSIS — Z8673 Personal history of transient ischemic attack (TIA), and cerebral infarction without residual deficits: Secondary | ICD-10-CM | POA: Diagnosis not present

## 2015-10-14 DIAGNOSIS — R778 Other specified abnormalities of plasma proteins: Secondary | ICD-10-CM | POA: Diagnosis not present

## 2015-10-14 DIAGNOSIS — E871 Hypo-osmolality and hyponatremia: Secondary | ICD-10-CM | POA: Diagnosis not present

## 2015-10-14 DIAGNOSIS — R6883 Chills (without fever): Secondary | ICD-10-CM | POA: Diagnosis not present

## 2015-10-14 DIAGNOSIS — R531 Weakness: Secondary | ICD-10-CM | POA: Diagnosis not present

## 2015-10-14 DIAGNOSIS — N184 Chronic kidney disease, stage 4 (severe): Secondary | ICD-10-CM | POA: Insufficient documentation

## 2015-10-14 DIAGNOSIS — R4182 Altered mental status, unspecified: Secondary | ICD-10-CM | POA: Insufficient documentation

## 2015-10-14 DIAGNOSIS — N401 Enlarged prostate with lower urinary tract symptoms: Secondary | ICD-10-CM | POA: Insufficient documentation

## 2015-10-14 DIAGNOSIS — R410 Disorientation, unspecified: Secondary | ICD-10-CM | POA: Diagnosis present

## 2015-10-14 DIAGNOSIS — Z7902 Long term (current) use of antithrombotics/antiplatelets: Secondary | ICD-10-CM | POA: Insufficient documentation

## 2015-10-14 DIAGNOSIS — I517 Cardiomegaly: Secondary | ICD-10-CM | POA: Insufficient documentation

## 2015-10-14 DIAGNOSIS — D320 Benign neoplasm of cerebral meninges: Secondary | ICD-10-CM | POA: Diagnosis not present

## 2015-10-14 DIAGNOSIS — Z7984 Long term (current) use of oral hypoglycemic drugs: Secondary | ICD-10-CM | POA: Diagnosis not present

## 2015-10-14 DIAGNOSIS — I2510 Atherosclerotic heart disease of native coronary artery without angina pectoris: Secondary | ICD-10-CM | POA: Insufficient documentation

## 2015-10-14 DIAGNOSIS — G20 Parkinson's disease: Secondary | ICD-10-CM | POA: Diagnosis not present

## 2015-10-14 DIAGNOSIS — E860 Dehydration: Secondary | ICD-10-CM | POA: Insufficient documentation

## 2015-10-14 DIAGNOSIS — Z743 Need for continuous supervision: Secondary | ICD-10-CM | POA: Diagnosis not present

## 2015-10-14 DIAGNOSIS — Z823 Family history of stroke: Secondary | ICD-10-CM | POA: Diagnosis not present

## 2015-10-14 DIAGNOSIS — R5383 Other fatigue: Secondary | ICD-10-CM | POA: Diagnosis not present

## 2015-10-14 DIAGNOSIS — Z79899 Other long term (current) drug therapy: Secondary | ICD-10-CM | POA: Insufficient documentation

## 2015-10-14 DIAGNOSIS — N179 Acute kidney failure, unspecified: Secondary | ICD-10-CM | POA: Diagnosis not present

## 2015-10-14 DIAGNOSIS — E559 Vitamin D deficiency, unspecified: Secondary | ICD-10-CM | POA: Diagnosis not present

## 2015-10-14 DIAGNOSIS — R4701 Aphasia: Secondary | ICD-10-CM | POA: Diagnosis present

## 2015-10-14 DIAGNOSIS — Z8249 Family history of ischemic heart disease and other diseases of the circulatory system: Secondary | ICD-10-CM | POA: Insufficient documentation

## 2015-10-14 DIAGNOSIS — D72829 Elevated white blood cell count, unspecified: Secondary | ICD-10-CM | POA: Insufficient documentation

## 2015-10-14 DIAGNOSIS — I739 Peripheral vascular disease, unspecified: Secondary | ICD-10-CM | POA: Diagnosis not present

## 2015-10-14 DIAGNOSIS — Z9849 Cataract extraction status, unspecified eye: Secondary | ICD-10-CM | POA: Insufficient documentation

## 2015-10-14 DIAGNOSIS — F329 Major depressive disorder, single episode, unspecified: Secondary | ICD-10-CM | POA: Diagnosis not present

## 2015-10-14 DIAGNOSIS — Z905 Acquired absence of kidney: Secondary | ICD-10-CM | POA: Diagnosis not present

## 2015-10-14 DIAGNOSIS — E785 Hyperlipidemia, unspecified: Secondary | ICD-10-CM | POA: Diagnosis not present

## 2015-10-14 DIAGNOSIS — I1310 Hypertensive heart and chronic kidney disease without heart failure, with stage 1 through stage 4 chronic kidney disease, or unspecified chronic kidney disease: Secondary | ICD-10-CM | POA: Insufficient documentation

## 2015-10-14 DIAGNOSIS — I959 Hypotension, unspecified: Secondary | ICD-10-CM | POA: Diagnosis not present

## 2015-10-14 DIAGNOSIS — I4581 Long QT syndrome: Secondary | ICD-10-CM | POA: Diagnosis not present

## 2015-10-14 LAB — ETHANOL: Alcohol, Ethyl (B): <5 mg/dL (ref <5)

## 2015-10-14 LAB — COMPREHENSIVE METABOLIC PANEL
ALBUMIN: 3.7 g/dL (ref 3.5–5.0)
ALK PHOS: 55 U/L (ref 38–126)
ALT: 7 U/L — AB (ref 17–63)
ANION GAP: 5 (ref 5–15)
AST: 30 U/L (ref 15–41)
BUN: 46 mg/dL — AB (ref 6–20)
CALCIUM: 8.3 mg/dL — AB (ref 8.9–10.3)
CO2: 19 mmol/L — AB (ref 22–32)
CREATININE: 3 mg/dL — AB (ref 0.61–1.24)
Chloride: 107 mmol/L (ref 101–111)
GFR calc Af Amer: 20 mL/min — ABNORMAL LOW (ref >60)
GFR calc non Af Amer: 17 mL/min — ABNORMAL LOW (ref >60)
GLUCOSE: 134 mg/dL — AB (ref 65–99)
Potassium: 4.5 mmol/L (ref 3.5–5.1)
SODIUM: 131 mmol/L — AB (ref 135–145)
TOTAL PROTEIN: 6.7 g/dL (ref 6.5–8.1)

## 2015-10-14 LAB — URINALYSIS COMPLETE WITH MICROSCOPIC (ARMC ONLY)
Bilirubin Urine: NEGATIVE
Glucose, UA: NEGATIVE mg/dL
HGB URINE DIPSTICK: NEGATIVE
LEUKOCYTES UA: NEGATIVE
Nitrite: NEGATIVE
PROTEIN: 100 mg/dL — AB
SPECIFIC GRAVITY, URINE: 1.016 (ref 1.005–1.030)
pH: 5 (ref 5.0–8.0)

## 2015-10-14 LAB — TROPONIN I: Troponin I: <0.03 ng/mL (ref <0.031)

## 2015-10-14 LAB — CBC WITH DIFFERENTIAL/PLATELET
BASOS ABS: 0.1 10*3/uL (ref 0–0.1)
Basophils Relative: 1 %
Eosinophils Absolute: 0.3 10*3/uL (ref 0–0.7)
Eosinophils Relative: 3 %
HEMATOCRIT: 36.3 % — AB (ref 40.0–52.0)
Hemoglobin: 12.2 g/dL — ABNORMAL LOW (ref 13.0–18.0)
LYMPHS ABS: 1 10*3/uL (ref 1.0–3.6)
LYMPHS PCT: 9 %
MCH: 29.7 pg (ref 26.0–34.0)
MCHC: 33.7 g/dL (ref 32.0–36.0)
MCV: 88.2 fL (ref 80.0–100.0)
MONO ABS: 0.9 10*3/uL (ref 0.2–1.0)
Monocytes Relative: 8 %
NEUTROS ABS: 8.5 10*3/uL — AB (ref 1.4–6.5)
Neutrophils Relative %: 79 %
Platelets: 244 10*3/uL (ref 150–440)
RBC: 4.12 MIL/uL — ABNORMAL LOW (ref 4.40–5.90)
RDW: 14.2 % (ref 11.5–14.5)
WBC: 10.8 10*3/uL — ABNORMAL HIGH (ref 3.8–10.6)

## 2015-10-14 LAB — TSH: TSH: 1.834 u[IU]/mL (ref 0.350–4.500)

## 2015-10-14 LAB — LACTIC ACID, PLASMA: Lactic Acid, Venous: 1.9 mmol/L (ref 0.5–2.0)

## 2015-10-14 MED ORDER — SODIUM CHLORIDE 0.9 % IV BOLUS (SEPSIS)
1000.0000 mL | Freq: Once | INTRAVENOUS | Status: AC
  Administered 2015-10-14: 1000 mL via INTRAVENOUS

## 2015-10-14 NOTE — ED Provider Notes (Signed)
[facility] Emergency Department Provider Note  ____________________________________________  Time seen: 7:30 PM  I have reviewed the triage vital signs and the nursing notes.   HISTORY  Chief Complaint Altered Mental Status Level 5 caveat:  Portions of the history and physical were unable to be obtained due to the patient's acute illness and altered level of consciousness    HPI Gary Ortega is a 82 y.o. male brought to the ED due to altered level of consciousness since yesterday. Has been constant. It seemed to happen suddenly in front of the family during lunch time. He's had difficulty speaking and speaks either 0 or 1 words at a time. No focal weakness but he has had generalized weakness and shuffling with his walker. Seems confused, earlier tried to walk himself through a wall.  There reports that he had episodes of low blood pressure at the nursing home as low as 60/40 or 80/40, but also that earlier today it was 190/100 at the nursing home.  Denies pain or shortness of breath.   Past Medical History  Diagnosis Date  . BPH (benign prostatic hypertrophy) with urinary obstruction   . CVA (cerebral infarction)   . Hypertension   . Parkinson disease ([place])   . Renal disorder   . Stroke ([place])   . Cancer [facility])      Patient Active Problem List   Diagnosis Date Noted  . Solitary kidney 09/15/2015  . S/p nephrectomy 09/15/2015  . Chronic kidney disease (CKD), stage IV (severe) ([place]) 09/15/2015  . Leukocytosis 09/15/2015  . Elevated troponin 09/15/2015  . Dysphagia 09/15/2015  . Carotid artery disease ([place]) 09/15/2015  . Acute cerebral infarction ([place])   . Ataxia 09/12/2015  . Weakness generalized 09/12/2015  . ARF (acute renal failure) ([place]) 09/12/2015  . Dehydration 09/12/2015  . ASHD  04/02/2015  . Depression, controlled 01/18/2015  . T2_NIDDM w/ CKD 4 (GFR 26 ml/min) 01/15/2015  . History of nephrectomy, unilateral 10/07/2014  .  Parkinson's disease ([place]) 03/27/2014  . Medication management 11/29/2013  . BPH (benign prostatic hypertrophy) with urinary obstruction   . TIA (transient ischemic attack) 06/30/2011  . Hyperlipidemia 06/03/2010  . Vitamin D deficiency 06/02/2010  . Essential hypertension 06/02/2010     Past Surgical History  Procedure Laterality Date  . Colonoscopy    . Cystoscopy      prostate radioactive I-125 seed impklantation   . Mandible fracture surgery  1948  . Appendectomy    . Hemorrhoid surgery    . Cataract extraction    . Total nephrectomy Right 2012     Current Outpatient Rx  Name  Route  Sig  Dispense  Refill  . atorvastatin (LIPITOR) 40 MG tablet   Oral   Take 40 mg by mouth daily.         . famotidine (PEPCID) 20 MG tablet   Oral   Take 1 tablet (20 mg total) by mouth at bedtime.   30 tablet   5   . omega-3 acid ethyl esters (LOVAZA) 1 g capsule   Oral   Take 1 g by mouth daily.         . vitamin C (ASCORBIC ACID) 500 MG tablet   Oral   Take 500 mg by mouth daily.         . bisacodyl (DULCOLAX) 10 MG suppository   Rectal   Place 1 suppository (10 mg total) rectally daily as needed for moderate constipation.   12 suppository  0   . carbidopa-levodopa (SINEMET IR) 25-250 MG tablet   Oral   Take 1 tablet by mouth 3 (three) times daily.   270 tablet   1   . Cholecalciferol (VITAMIN D3) 5000 units TABS   Oral   Take 5,000 Units by mouth daily.         . clopidogrel (PLAVIX) 75 MG tablet   Oral   Take 75 mg by mouth daily.         Marland Kitchen docusate sodium (COLACE) 100 MG capsule   Oral   Take 1 capsule (100 mg total) by mouth 2 (two) times daily.   10 capsule   0   . Emollient (EUCERIN EX)   Topical   Apply 1 application topically 3 (three) times daily. Pt applies to back.         . feeding supplement, ENSURE ENLIVE, (ENSURE ENLIVE) LIQD   Oral   Take 237 mLs by mouth 2 (two) times daily between meals. Patient not taking: Reported on  10/02/2015   237 mL   12   . hydrocortisone butyrate (LUCOID) 0.1 % CREA cream   Topical   Apply 1 application topically daily as needed (for rash on face).         Marland Kitchen ketoconazole (NIZORAL) 2 % shampoo   Topical   Apply 1 application topically daily as needed (for seborrheic dermatitis of scalp).          . Multiple Vitamin (MULTIVITAMIN WITH MINERALS) TABS tablet   Oral   Take 1 tablet by mouth daily.         . nitroGLYCERIN (NITROSTAT) 0.4 MG SL tablet   Sublingual   Place 0.4 mg under the tongue every 5 (five) minutes as needed for chest pain.         . polyethylene glycol (MIRALAX / GLYCOLAX) packet   Oral   Take 17 g by mouth daily.   14 each   0      Allergies Ace inhibitors; Lisinopril; Nsaids; Tolmetin; and Wellbutrin   Family History  Problem Relation Age of Onset  . Stroke Mother   . Hypertension Mother   . Stroke Father   . Hypertension Brother     Social History Social History  Substance Use Topics  . Smoking status: Never Smoker   . Smokeless tobacco: Not on file  . Alcohol Use: No    Review of Systems  Constitutional:   No fever or chills. No weight changes. positive generalized weakness. Eyes:   No blurry vision or double vision.  ENT:   No sore throat.  Cardiovascular:   No chest pain. Respiratory:   No dyspnea or cough. Gastrointestinal:   Negative for abdominal pain, vomiting and diarrhea.  No BRBPR or melena. Genitourinary:   Negative for dysuria or difficulty urinating. Musculoskeletal:   Negative for back pain. No joint swelling or pain. Skin:   Negative for rash. Neurological:   Negative for headaches, focal weakness or numbness. Positive altered mental status Psychiatric:  No anxiety or depression.    10-point ROS otherwise negative.  ____________________________________________   PHYSICAL EXAM:  VITAL SIGNS: ED Triage Vitals  Enc Vitals Group     BP 10/14/15 1909 94/51 mmHg     Pulse Rate 10/14/15 1909 75      Resp 10/14/15 1909 20     Temp 10/14/15 1909 97.4 F (36.3 C)     Temp Source 10/14/15 1909 Oral     SpO2 10/14/15 1909 96 %  Weight 10/14/15 1909 160 lb (72.576 kg)     Height 10/14/15 1909 6' (1.829 m)     Head Cir --      Peak Flow --      Pain Score 10/14/15 1909 0     Pain Loc --      Pain Edu? --      Excl. in [place]? --     Vital signs reviewed, nursing assessments reviewed.   Constitutional:   Alert and oriented 3. No distress. Eyes:   No scleral icterus. No conjunctival pallor. PERRL. EOMI ENT   Head:   Normocephalic and atraumatic.   Nose:   No congestion/rhinnorhea. No septal hematoma   Mouth/Throat:   MMM, no pharyngeal erythema. No peritonsillar mass.    Neck:   No stridor. No SubQ emphysema. No meningismus. Hematological/Lymphatic/Immunilogical:   No cervical lymphadenopathy. Cardiovascular:   RRR. Symmetric bilateral radial and DP pulses.  No murmurs.  Respiratory:   Normal respiratory effort without tachypnea nor retractions. Breath sounds are clear and equal bilaterally. Diffuse crackles throughout both lungs. Gastrointestinal:   Soft and nontender. Non distended. There is no CVA tenderness.  No rebound, rigidity, or guarding. Genitourinary:   deferred Musculoskeletal:   Nontender with normal range of motion in all extremities. No joint effusions.  No lower extremity tenderness.  No edema. Neurologic:   Incoherent speech, seems to have difficulty forming or finding words. Seems to get upset when trying to speak.  CN 2-10 normal. Motor grossly intact. Skin:    Skin is warm, dry and intact. No rash noted.  No petechiae, purpura, or bullae. Psychiatric:   Mood and affect are normal. ____________________________________________    LABS (pertinent positives/negatives) (all labs ordered are listed, but only abnormal results are displayed) Labs Reviewed  URINE CULTURE  CULTURE, BLOOD (ROUTINE X 2)  CULTURE, BLOOD (ROUTINE X 2)  COMPREHENSIVE  METABOLIC PANEL  ETHANOL  TROPONIN I  LACTIC ACID, PLASMA  LACTIC ACID, PLASMA  CBC WITH DIFFERENTIAL/PLATELET  URINALYSIS COMPLETEWITH MICROSCOPIC ([facility] ONLY)  TSH   ____________________________________________   EKG    ____________________________________________    RADIOLOGY Chest x-ray unremarkable. CT head does not show any acute findings. There are multiple previous strokes.  ____________________________________________   PROCEDURES   ____________________________________________   INITIAL IMPRESSION / ASSESSMENT AND PLAN / ED COURSE  Pertinent labs & imaging results that were available during my care of the patient were reviewed by me and considered in my medical decision making (see chart for details).  Patient presents with altered mental status with some episodic confusion. Also has some pronounced speech difficulties. We'll check labs here and chest x-ray CT head, especially with the concern for subdural hematoma with his recent trauma. However, this also may be a subacute stroke. The findings of hypotension at the nursing home are somewhat peculiar as they are intermittent with different blood pressures reported to 70/40 or 60/40 with an earlier today at 190/100. No obvious source of infection, we'll hold off on antibiotics and for further workup. Afebrile, blood pressure stable in the ED.    ----------------------------------------- 8:45 PM on 10/14/2015 -----------------------------------------  CT head negative. Patient will be signed out to Dr. Robet Leu pending labs, plan for admission due to aphasia and altered mental status. I do have a coud catheter placed to obtain urine so we'll keep this in place due to the acute illness   ____________________________________________   FINAL CLINICAL IMPRESSION(S) / ED DIAGNOSES  Final diagnoses:  Disorientation  Hypotension, unspecified hypotension type  Carrie Mew, MD 10/14/15 2046

## 2015-10-14 NOTE — ED Notes (Signed)
[facility] ([address] [place] and [place]), both POA's, are requesting to be updated about patients care in regards to discharge or admission.   Camille Thaxton: [place]: [phone]

## 2015-10-14 NOTE — ED Notes (Signed)
Per EMS, patient comes from [name]. Yesterday they noticed a change in his LOC. Patient is usually able to carry on a conversation but since yesterday has only been giving one word answers. Staff at [facility] thought it was due to all of his medication so they put him on a "drug holiday". Today, they noticed him getting worse. LC states his BP was 80/40. EMS got a pressure of 99/50. Here patient is 94/51. Patient is A&O x3, disoriented to situation.

## 2015-10-15 ENCOUNTER — Encounter: Payer: Self-pay | Admitting: *Deleted

## 2015-10-15 DIAGNOSIS — I2510 Atherosclerotic heart disease of native coronary artery without angina pectoris: Secondary | ICD-10-CM | POA: Diagnosis not present

## 2015-10-15 DIAGNOSIS — B349 Viral infection, unspecified: Secondary | ICD-10-CM | POA: Diagnosis not present

## 2015-10-15 DIAGNOSIS — R6883 Chills (without fever): Secondary | ICD-10-CM | POA: Diagnosis present

## 2015-10-15 DIAGNOSIS — G20 Parkinson's disease: Secondary | ICD-10-CM | POA: Diagnosis not present

## 2015-10-15 DIAGNOSIS — N179 Acute kidney failure, unspecified: Secondary | ICD-10-CM | POA: Diagnosis not present

## 2015-10-15 DIAGNOSIS — I959 Hypotension, unspecified: Secondary | ICD-10-CM | POA: Diagnosis not present

## 2015-10-15 LAB — RAPID INFLUENZA A&B ANTIGENS
Influenza A (ARMC): NEGATIVE
Influenza B (ARMC): NEGATIVE

## 2015-10-15 LAB — LACTIC ACID, PLASMA: Lactic Acid, Venous: 1.5 mmol/L (ref 0.5–2.0)

## 2015-10-15 LAB — MRSA PCR SCREENING: MRSA by PCR: NEGATIVE

## 2015-10-15 LAB — HEMOGLOBIN A1C: Hgb A1c MFr Bld: 5.8 % (ref 4.0–6.0)

## 2015-10-15 MED ORDER — ONDANSETRON HCL 4 MG PO TABS: 4.0000 mg | ORAL_TABLET | Freq: Four times a day (QID) | ORAL | Status: DC | PRN

## 2015-10-15 MED ORDER — CLOPIDOGREL BISULFATE 75 MG PO TABS
75.0000 mg | ORAL_TABLET | Freq: Every day | ORAL | Status: DC
Start: 2015-10-15 — End: 2015-10-16
  Administered 2015-10-15 – 2015-10-16 (×2): 75 mg via ORAL
  Filled 2015-10-15 (×2): qty 1

## 2015-10-15 MED ORDER — POLYETHYLENE GLYCOL 3350 17 G PO PACK
17.0000 g | PACK | Freq: Every day | ORAL | Status: DC
  Administered 2015-10-15 – 2015-10-16 (×2): 17 g via ORAL
  Filled 2015-10-15 (×2): qty 1

## 2015-10-15 MED ORDER — ACETAMINOPHEN 325 MG PO TABS: 650.0000 mg | ORAL_TABLET | Freq: Four times a day (QID) | ORAL | Status: DC | PRN

## 2015-10-15 MED ORDER — HEPARIN SODIUM (PORCINE) 5000 UNIT/ML IJ SOLN
5000.0000 [IU] | Freq: Three times a day (TID) | INTRAMUSCULAR | Status: DC
  Administered 2015-10-15 – 2015-10-16 (×5): 5000 [IU] via SUBCUTANEOUS
  Filled 2015-10-15 (×5): qty 1

## 2015-10-15 MED ORDER — ATORVASTATIN CALCIUM 20 MG PO TABS
40.0000 mg | ORAL_TABLET | Freq: Every day | ORAL | Status: DC
  Administered 2015-10-15 – 2015-10-16 (×2): 40 mg via ORAL
  Filled 2015-10-15 (×2): qty 2

## 2015-10-15 MED ORDER — BISACODYL 10 MG RE SUPP: 10.0000 mg | Freq: Every day | RECTAL | Status: DC | PRN

## 2015-10-15 MED ORDER — SODIUM CHLORIDE 0.9 % IV SOLN
INTRAVENOUS | Status: DC
  Administered 2015-10-15 (×2): via INTRAVENOUS

## 2015-10-15 MED ORDER — CARBIDOPA-LEVODOPA 25-250 MG PO TABS
1.0000 | ORAL_TABLET | Freq: Three times a day (TID) | ORAL | Status: DC
  Administered 2015-10-15 – 2015-10-16 (×4): 1 via ORAL
  Filled 2015-10-15 (×5): qty 1

## 2015-10-15 MED ORDER — NITROGLYCERIN 0.4 MG SL SUBL
0.4000 mg | SUBLINGUAL_TABLET | SUBLINGUAL | Status: DC | PRN
Start: 2015-10-15 — End: 2015-10-16

## 2015-10-15 MED ORDER — ACETAMINOPHEN 500 MG PO TABS
1000.0000 mg | ORAL_TABLET | Freq: Once | ORAL | Status: AC
  Administered 2015-10-15: 1000 mg via ORAL
  Filled 2015-10-15: qty 2

## 2015-10-15 MED ORDER — FAMOTIDINE 20 MG PO TABS
20.0000 mg | ORAL_TABLET | Freq: Every day | ORAL | Status: DC
  Administered 2015-10-15: 20 mg via ORAL
  Filled 2015-10-15: qty 1

## 2015-10-15 MED ORDER — ACETAMINOPHEN 650 MG RE SUPP: 650.0000 mg | Freq: Four times a day (QID) | RECTAL | Status: DC | PRN

## 2015-10-15 MED ORDER — MORPHINE SULFATE (PF) 2 MG/ML IV SOLN: 1.0000 mg | INTRAVENOUS | Status: DC | PRN

## 2015-10-15 MED ORDER — ADULT MULTIVITAMIN W/MINERALS CH
1.0000 | ORAL_TABLET | Freq: Every day | ORAL | Status: DC
  Administered 2015-10-15 – 2015-10-16 (×2): 1 via ORAL
  Filled 2015-10-15 (×2): qty 1

## 2015-10-15 MED ORDER — DOCUSATE SODIUM 100 MG PO CAPS
100.0000 mg | ORAL_CAPSULE | Freq: Two times a day (BID) | ORAL | Status: DC
  Administered 2015-10-15 – 2015-10-16 (×3): 100 mg via ORAL
  Filled 2015-10-15 (×4): qty 1

## 2015-10-15 MED ORDER — KETOCONAZOLE 2 % EX SHAM: 1.0000 "application " | MEDICATED_SHAMPOO | Freq: Every day | CUTANEOUS | Status: DC | PRN

## 2015-10-15 MED ORDER — ONDANSETRON HCL 4 MG/2ML IJ SOLN: 4.0000 mg | Freq: Four times a day (QID) | INTRAMUSCULAR | Status: DC | PRN

## 2015-10-15 MED ORDER — VITAMIN D 1000 UNITS PO TABS
5000.0000 [IU] | ORAL_TABLET | Freq: Every day | ORAL | Status: DC
  Administered 2015-10-15 – 2015-10-16 (×2): 5000 [IU] via ORAL
  Filled 2015-10-15 (×2): qty 5

## 2015-10-15 MED ORDER — OMEGA-3-ACID ETHYL ESTERS 1 G PO CAPS
1.0000 g | ORAL_CAPSULE | Freq: Every day | ORAL | Status: DC
  Administered 2015-10-15 – 2015-10-16 (×2): 1 g via ORAL
  Filled 2015-10-15 (×2): qty 1

## 2015-10-15 MED ORDER — VITAMIN C 500 MG PO TABS
500.0000 mg | ORAL_TABLET | Freq: Every day | ORAL | Status: DC
  Administered 2015-10-15 – 2015-10-16 (×2): 500 mg via ORAL
  Filled 2015-10-15 (×2): qty 1

## 2015-10-15 NOTE — Progress Notes (Signed)
Notified Dr. Molli Hazard that pt HR has been stable, MD stated he would round on pt then determine need for cardiac monitor or not. Also made CCMD aware.

## 2015-10-15 NOTE — Care Management Obs Status (Signed)
[place] NOTIFICATION   Patient Details  Name: TYWAIN SULKOWSKI MRN: [id number] Date of Birth: [birth year]   Medicare Observation Status Notification Given:  Yes    Beverly Sessions, RN 10/15/2015, 10:49 AM

## 2015-10-15 NOTE — Progress Notes (Signed)
Cardiac monitor order discontinued by Dr. Molli Hazard. CCMD made aware. Pt resting in bed. Continue to assess.

## 2015-10-15 NOTE — ED Notes (Signed)
Hospitalist at bedside 

## 2015-10-15 NOTE — H&P (Signed)
Gary Ortega is an 82 y.o. male.   Chief Complaint: Altered mental status HPI: The patient presents emergency department from his nursing home where he was supposedly disoriented. Upon my interview with the patient he complained of feeling chills and feeling lightheaded. In the emergency department initial workup was essentially negative. It is unclear if the patient felt some urinary retention but he has been catheterized. Urinalysis is negative for infection, however laboratory evaluation reveals mild leukocytosis. He is also mildly hypotensive which prompted the emergency department staff to call for admission.  Past Medical History  Diagnosis Date  . BPH (benign prostatic hypertrophy) with urinary obstruction   . CVA (cerebral infarction)   . Hypertension   . Parkinson disease ([place])   . Renal disorder   . Stroke ([place])   . Cancer [facility])     Past Surgical History  Procedure Laterality Date  . Colonoscopy    . Cystoscopy      prostate radioactive I-125 seed impklantation   . Mandible fracture surgery  1948  . Appendectomy    . Hemorrhoid surgery    . Cataract extraction    . Total nephrectomy Right 2012    Family History  Problem Relation Age of Onset  . Stroke Mother   . Hypertension Mother   . Stroke Father   . Hypertension Brother    Social History:  reports that he has never smoked. He does not have any smokeless tobacco history on file. He reports that he does not drink alcohol or use illicit drugs.  Allergies:  Allergies  Allergen Reactions  . Ace Inhibitors Other (See Comments)    Reaction:  Causes kidney issues for pt   . Lisinopril Other (See Comments)    Reaction:  Causes kidney issues for pt  . Nsaids Other (See Comments)    Reaction:  Unknown   . Tolmetin Other (See Comments)    Reaction:  Unknown   . Wellbutrin [Bupropion] Other (See Comments)    Reaction:  Confusion     Prior to Admission medications   Medication Sig Start Date End Date Taking?  Authorizing Provider  atorvastatin (LIPITOR) 40 MG tablet Take 40 mg by mouth daily.   Yes Historical Provider, MD  bisacodyl (DULCOLAX) 10 MG suppository Place 1 suppository (10 mg total) rectally daily as needed for moderate constipation. 09/15/15  Yes Theodoro Grist, MD  carbidopa-levodopa (SINEMET IR) 25-250 MG tablet Take 1 tablet by mouth 3 (three) times daily. 05/08/15  Yes Unk Pinto, MD  Cholecalciferol (VITAMIN D3) 5000 units TABS Take 5,000 Units by mouth daily.   Yes Historical Provider, MD  clopidogrel (PLAVIX) 75 MG tablet Take 75 mg by mouth daily.   Yes Historical Provider, MD  docusate sodium (COLACE) 100 MG capsule Take 1 capsule (100 mg total) by mouth 2 (two) times daily. 09/15/15  Yes Theodoro Grist, MD  Emollient (EUCERIN EX) Apply 1 application topically 3 (three) times daily. Pt applies to back.   Yes Historical Provider, MD  famotidine (PEPCID) 20 MG tablet Take 1 tablet (20 mg total) by mouth at bedtime. 09/15/15  Yes Theodoro Grist, MD  hydrocortisone butyrate (LUCOID) 0.1 % CREA cream Apply 1 application topically daily as needed (for rash on face).   Yes Historical Provider, MD  ketoconazole (NIZORAL) 2 % shampoo Apply 1 application topically daily as needed (for seborrheic dermatitis of scalp).    Yes Historical Provider, MD  Multiple Vitamin (MULTIVITAMIN WITH MINERALS) TABS tablet Take 1 tablet by mouth daily.  Yes Historical Provider, MD  nitroGLYCERIN (NITROSTAT) 0.4 MG SL tablet Place 0.4 mg under the tongue every 5 (five) minutes as needed for chest pain.   Yes Historical Provider, MD  omega-3 acid ethyl esters (LOVAZA) 1 g capsule Take 1 g by mouth daily.   Yes Historical Provider, MD  polyethylene glycol (MIRALAX / GLYCOLAX) packet Take 17 g by mouth daily. 09/15/15  Yes Theodoro Grist, MD  vitamin C (ASCORBIC ACID) 500 MG tablet Take 500 mg by mouth daily.   Yes Historical Provider, MD  feeding supplement, ENSURE ENLIVE, (ENSURE ENLIVE) LIQD Take 237 mLs by mouth  2 (two) times daily between meals. Patient not taking: Reported on 10/02/2015 09/15/15   Theodoro Grist, MD     Results for orders placed or performed during the hospital encounter of 10/14/15 (from the past 48 hour(s))  Comprehensive metabolic panel     Status: Abnormal   Collection Time: 10/14/15  7:15 PM  Result Value Ref Range   Sodium 131 (L) 135 - 145 mmol/L   Potassium 4.5 3.5 - 5.1 mmol/L   Chloride 107 101 - 111 mmol/L   CO2 19 (L) 22 - 32 mmol/L   Glucose, Bld 134 (H) 65 - 99 mg/dL   BUN 46 (H) 6 - 20 mg/dL   Creatinine, Ser 3.00 (H) 0.61 - 1.24 mg/dL   Calcium 8.3 (L) 8.9 - 10.3 mg/dL   Total Protein 6.7 6.5 - 8.1 g/dL   Albumin 3.7 3.5 - 5.0 g/dL   AST 30 15 - 41 U/L   ALT 7 (L) 17 - 63 U/L   Alkaline Phosphatase 55 38 - 126 U/L   Total Bilirubin <0.1 (L) 0.3 - 1.2 mg/dL   GFR calc non Af Amer 17 (L) >60 mL/min   GFR calc Af Amer 20 (L) >60 mL/min    Comment: (NOTE) The eGFR has been calculated using the CKD EPI equation. This calculation has not been validated in all clinical situations. eGFR's persistently <60 mL/min signify possible Chronic Kidney Disease.    Anion gap 5 5 - 15  Ethanol     Status: None   Collection Time: 10/14/15  7:15 PM  Result Value Ref Range   Alcohol, Ethyl (B) <5 <5 mg/dL    Comment:        LOWEST DETECTABLE LIMIT FOR SERUM ALCOHOL IS 5 mg/dL FOR MEDICAL PURPOSES ONLY   Troponin I     Status: None   Collection Time: 10/14/15  7:15 PM  Result Value Ref Range   Troponin I <0.03 <0.031 ng/mL    Comment:        NO INDICATION OF MYOCARDIAL INJURY.   CBC with Differential     Status: Abnormal   Collection Time: 10/14/15  7:15 PM  Result Value Ref Range   WBC 10.8 (H) 3.8 - 10.6 K/uL   RBC 4.12 (L) 4.40 - 5.90 MIL/uL   Hemoglobin 12.2 (L) 13.0 - 18.0 g/dL   HCT 36.3 (L) 40.0 - 52.0 %   MCV 88.2 80.0 - 100.0 fL   MCH 29.7 26.0 - 34.0 pg   MCHC 33.7 32.0 - 36.0 g/dL   RDW 14.2 11.5 - 14.5 %   Platelets 244 150 - 440 K/uL    Neutrophils Relative % 79 %   Neutro Abs 8.5 (H) 1.4 - 6.5 K/uL   Lymphocytes Relative 9 %   Lymphs Abs 1.0 1.0 - 3.6 K/uL   Monocytes Relative 8 %   Monocytes Absolute 0.9  0.2 - 1.0 K/uL   Eosinophils Relative 3 %   Eosinophils Absolute 0.3 0 - 0.7 K/uL   Basophils Relative 1 %   Basophils Absolute 0.1 0 - 0.1 K/uL  TSH     Status: None   Collection Time: 10/14/15  7:15 PM  Result Value Ref Range   TSH 1.834 0.350 - 4.500 uIU/mL  Urinalysis complete, with microscopic     Status: Abnormal   Collection Time: 10/14/15  7:44 PM  Result Value Ref Range   Color, Urine YELLOW (A) YELLOW   APPearance HAZY (A) CLEAR   Glucose, UA NEGATIVE NEGATIVE mg/dL   Bilirubin Urine NEGATIVE NEGATIVE   Ketones, ur TRACE (A) NEGATIVE mg/dL   Specific Gravity, Urine 1.016 1.005 - 1.030   Hgb urine dipstick NEGATIVE NEGATIVE   pH 5.0 5.0 - 8.0   Protein, ur 100 (A) NEGATIVE mg/dL   Nitrite NEGATIVE NEGATIVE   Leukocytes, UA NEGATIVE NEGATIVE   RBC / HPF 6-30 0 - 5 RBC/hpf   WBC, UA 0-5 0 - 5 WBC/hpf   Bacteria, UA RARE (A) NONE SEEN   Squamous Epithelial / LPF 0-5 (A) NONE SEEN   Mucous PRESENT    Hyaline Casts, UA PRESENT   Lactic acid, plasma     Status: None   Collection Time: 10/14/15  8:27 PM  Result Value Ref Range   Lactic Acid, Venous 1.9 0.5 - 2.0 mmol/L  Rapid Influenza A&B Antigens ([facility] only)     Status: None   Collection Time: 10/15/15  2:31 AM  Result Value Ref Range   Influenza A ([facility]) NEGATIVE NEGATIVE   Influenza B [facility]) NEGATIVE NEGATIVE   Dg Chest 1 View  10/14/2015  CLINICAL DATA:  Altered mental status EXAM: CHEST 1 VIEW COMPARISON:  September 12, 2015 FINDINGS: There is bibasilar atelectasis. Lungs elsewhere clear. Heart is upper normal in size with pulmonary vascularity within normal limits. No adenopathy. No bone lesions. IMPRESSION: Bibasilar atelectasis. Lungs elsewhere clear. No change in cardiac silhouette. Electronically Signed   By: Lowella Grip III M.D.    On: 10/14/2015 20:00   Ct Head Wo Contrast  10/14/2015  CLINICAL DATA:  Altered mental status. History of Parkinson's disease EXAM: CT HEAD WITHOUT CONTRAST TECHNIQUE: Contiguous axial images were obtained from the base of the skull through the vertex without intravenous contrast. COMPARISON:  October 02, 2015 FINDINGS: Moderate diffuse atrophy is stable. There is a stable calcified right midportion parafalcine meningioma measuring 1.2 x 1.0 cm without appreciable surrounding edema. There is no other mass. There is no hemorrhage, extra-axial fluid collection, or midline shift. There is evidence of an old infarct in the inferior medial right cerebellum, stable. There is small vessel disease throughout the centra semiovale bilaterally. There is evidence of a prior lacunar infarct in the head of the caudate nucleus on the left. There is small vessel disease in the external capsule anteriorly on the left. No new gray-white compartment lesions are evident. No acute infarct evident. The bony calvarium appears intact. The mastoid air cells are clear. No intraorbital lesions are apparent. IMPRESSION: Atrophy with extensive small vessel disease. Prior right inferomedial cerebellar infarct. Prior small infarct in the head of the caudate nucleus on the left as well as small vessel disease in the external capsule on the left anteriorly. No acute infarct. No hemorrhage. Stable calcified right parafalcine meningioma without surrounding edema. Electronically Signed   By: Lowella Grip III M.D.   On: 10/14/2015 20:30    Review  of Systems  Constitutional: Negative for fever and chills.  HENT: Negative for sore throat and tinnitus.   Eyes: Negative for blurred vision and redness.  Respiratory: Negative for cough and shortness of breath.   Cardiovascular: Negative for chest pain, palpitations, orthopnea and PND.  Gastrointestinal: Negative for nausea, vomiting, abdominal pain and diarrhea.  Genitourinary: Negative for  dysuria, urgency and frequency.  Musculoskeletal: Negative for myalgias and joint pain.  Skin: Negative for rash.       No lesions  Neurological: Negative for speech change, focal weakness and weakness.  Endo/Heme/Allergies: Does not bruise/bleed easily.       No temperature intolerance  Psychiatric/Behavioral: Negative for depression and suicidal ideas.    Blood pressure 142/77, pulse 76, temperature 97.8 F (36.6 C), temperature source Oral, resp. rate 20, height 6' (1.829 m), weight 72.576 kg (160 lb), SpO2 97 %. Physical Exam  Constitutional: He is oriented to person, place, and time. He appears well-developed and well-nourished. No distress.  HENT:  Head: Normocephalic and atraumatic.  Mouth/Throat: Oropharynx is clear and moist.  Eyes: Conjunctivae and EOM are normal. Pupils are equal, round, and reactive to light. No scleral icterus.  Neck: Normal range of motion. Neck supple. No JVD present. No tracheal deviation present. No thyromegaly present.  Cardiovascular: Normal rate, regular rhythm and normal heart sounds.  Exam reveals no gallop and no friction rub.   No murmur heard. Genitourinary:  Catheter in place  Musculoskeletal: Normal range of motion. He exhibits no edema.  Lymphadenopathy:    He has no cervical adenopathy.  Neurological: He is alert and oriented to person, place, and time. No cranial nerve deficit.  Skin: Skin is warm and dry. No rash noted. No erythema.  Psychiatric: He has a normal mood and affect. His behavior is normal. Judgment and thought content normal.     Assessment/Plan This is an 82 year old man admitted for mild hypotension and chills. 1. Hypotension: The patient reports not eating well lately. He says he feels sick and is likely somewhat dehydrated. He is mildly hyponatremic likely secondary to hypovolemic. I will gently rehydrate with normal saline. 2. Acute on chronic renal failure: He appears to have stage IV chronic kidney disease at  baseline. Continue fluid resuscitation. Avoid nephrotoxic agents. 3. Chills: In the presence of leukocytosis appears patient may have a viral syndrome. Check for influenza. Patient is on droplet precautions. 4. Parkinson's disease: Continue Sinemet; no aphasia present. Slight garbling of words likely secondary to jaw muscle stiffness recently associated with Parkinsonian rigidity and/or difficulty with secretions 5. CAD: Stable continue Plavix 6. Hyperlipidemia: Continue Lipitor 7. DVT prophylaxis: Heparin 8. GI prophylaxis: Pepcid per home regimen The patient is a full code. Time spent on admission orders and patient care approximately 45 minutes   Harrie Foreman, MD 10/15/2015, 3:16 AM

## 2015-10-15 NOTE — Progress Notes (Signed)
several attempts were made to get patient on telemetry monitoring prior to MD rounding on pt. Notified MD to assess need for monitor. Notified Charge RN to assist with find telemetry box to go on pt. Charge RN called to other units which did not have boxes to lend. Asked other floor nurses as well if other patient's could come off.  Pt heart rate is stable, and last checked was 74. Pt resting in bed. Will continue to look for available tele-boxes while waiting for MD to round and will continue to assess pt and VS.

## 2015-10-15 NOTE — Progress Notes (Signed)
[place] [facility] [place] NAME: Gary Ortega    MR#:  [id number]  DATE OF BIRTH:  October 28, 1924  SUBJECTIVE:  CHIEF COMPLAINT:   Chief Complaint  Patient presents with  . Altered Mental Status    Brought with hypotension, some confusion and weakness.   Found to have all negative work ups for infection.    With IV fluids- feels better now.  REVIEW OF SYSTEMS:  CONSTITUTIONAL: No fever, positive for fatigue or weakness.  EYES: No blurred or double vision.  EARS, NOSE, AND THROAT: No tinnitus or ear pain.  RESPIRATORY: No cough, shortness of breath, wheezing or hemoptysis.  CARDIOVASCULAR: No chest pain, orthopnea, edema.  GASTROINTESTINAL: No nausea, vomiting, diarrhea or abdominal pain.  GENITOURINARY: No dysuria, hematuria.  ENDOCRINE: No polyuria, nocturia,  HEMATOLOGY: No anemia, easy bruising or bleeding SKIN: No rash or lesion. MUSCULOSKELETAL: No joint pain or arthritis.   NEUROLOGIC: No tingling, numbness, weakness.  PSYCHIATRY: No anxiety or depression.   ROS  DRUG ALLERGIES:   Allergies  Allergen Reactions  . Ace Inhibitors Other (See Comments)    Reaction:  Causes kidney issues for pt   . Lisinopril Other (See Comments)    Reaction:  Causes kidney issues for pt  . Nsaids Other (See Comments)    Reaction:  Unknown   . Tolmetin Other (See Comments)    Reaction:  Unknown   . Wellbutrin [Bupropion] Other (See Comments)    Reaction:  Confusion     VITALS:  Blood pressure 152/69, pulse 76, temperature 97.9 F (36.6 C), temperature source Oral, resp. rate 18, height 5\' 8"  (1.727 m), weight 74.254 kg (163 lb 11.2 oz), SpO2 96 %.  PHYSICAL EXAMINATION:  GENERAL:  82 y.o.-year-old patient lying in the bed with no acute distress.  EYES: Pupils equal, round, reactive to light and accommodation. No scleral icterus. Extraocular muscles intact.  HEENT: Head atraumatic, normocephalic. Oropharynx and nasopharynx clear.   NECK:  Supple, no jugular venous distention. No thyroid enlargement, no tenderness.  LUNGS: Normal breath sounds bilaterally, no wheezing, rales,rhonchi or crepitation. No use of accessory muscles of respiration.  CARDIOVASCULAR: S1, S2 normal. No murmurs, rubs, or gallops.  ABDOMEN: Soft, nontender, nondistended. Bowel sounds present. No organomegaly or mass. Foley in place. EXTREMITIES: No pedal edema, cyanosis, or clubbing.  NEUROLOGIC: Cranial nerves II through XII are intact. Muscle strength 5/5 in all extremities. Sensation intact. Gait not checked.  PSYCHIATRIC: The patient is alert and oriented x 3.  SKIN: No obvious rash, lesion, or ulcer.   Physical Exam LABORATORY PANEL:   CBC  Recent Labs Lab 10/14/15 1915  WBC 10.8*  HGB 12.2*  HCT 36.3*  PLT 244   ------------------------------------------------------------------------------------------------------------------  Chemistries   Recent Labs Lab 10/14/15 1915  NA 131*  K 4.5  CL 107  CO2 19*  GLUCOSE 134*  BUN 46*  CREATININE 3.00*  CALCIUM 8.3*  AST 30  ALT 7*  ALKPHOS 55  BILITOT <0.1*   ------------------------------------------------------------------------------------------------------------------  Cardiac Enzymes  Recent Labs Lab 10/14/15 1915  TROPONINI <0.03   ------------------------------------------------------------------------------------------------------------------  RADIOLOGY:  Dg Chest 1 View  10/14/2015  CLINICAL DATA:  Altered mental status EXAM: CHEST 1 VIEW COMPARISON:  September 12, 2015 FINDINGS: There is bibasilar atelectasis. Lungs elsewhere clear. Heart is upper normal in size with pulmonary vascularity within normal limits. No adenopathy. No bone lesions. IMPRESSION: Bibasilar atelectasis. Lungs elsewhere clear. No change in cardiac silhouette. Electronically Signed   By: Gwyndolyn Saxon  Jasmine December III M.D.   On: 10/14/2015 20:00   Ct Head Wo Contrast  10/14/2015  CLINICAL DATA:   Altered mental status. History of Parkinson's disease EXAM: CT HEAD WITHOUT CONTRAST TECHNIQUE: Contiguous axial images were obtained from the base of the skull through the vertex without intravenous contrast. COMPARISON:  October 02, 2015 FINDINGS: Moderate diffuse atrophy is stable. There is a stable calcified right midportion parafalcine meningioma measuring 1.2 x 1.0 cm without appreciable surrounding edema. There is no other mass. There is no hemorrhage, extra-axial fluid collection, or midline shift. There is evidence of an old infarct in the inferior medial right cerebellum, stable. There is small vessel disease throughout the centra semiovale bilaterally. There is evidence of a prior lacunar infarct in the head of the caudate nucleus on the left. There is small vessel disease in the external capsule anteriorly on the left. No new gray-white compartment lesions are evident. No acute infarct evident. The bony calvarium appears intact. The mastoid air cells are clear. No intraorbital lesions are apparent. IMPRESSION: Atrophy with extensive small vessel disease. Prior right inferomedial cerebellar infarct. Prior small infarct in the head of the caudate nucleus on the left as well as small vessel disease in the external capsule on the left anteriorly. No acute infarct. No hemorrhage. Stable calcified right parafalcine meningioma without surrounding edema. Electronically Signed   By: Lowella Grip III M.D.   On: 10/14/2015 20:30    ASSESSMENT AND PLAN:   Active Problems:   Chills  1. Hypotension: The patient reports not eating well lately. He says he feels sick and is likely somewhat dehydrated. He is mildly hyponatremic likely secondary to hypovolemic. gently rehydrate with normal saline. 2. Acute on chronic renal failure: He appears to have stage IV chronic kidney disease at baseline. Continue fluid resuscitation. Avoid nephrotoxic agents.  he was on losartan- will stop it on d/c. 3. Chills: In the  presence of leukocytosis appears patient may have a viral syndrome. Negative for influenza. Patient is on droplet precautions. 4. Parkinson's disease: Continue Sinemet; no aphasia present. Slight garbling of words likely secondary to jaw muscle stiffness recently associated with Parkinsonian rigidity and/or difficulty with secretions 5. CAD: Stable continue Plavix 6. Hyperlipidemia: Continue Lipitor 7. DVT prophylaxis: Heparin 8. GI prophylaxis: Pepcid per home regimen   All the records are reviewed and case discussed with Care Management/Social Workerr. Management plans discussed with the patient, family and they are in agreement.  CODE STATUS: Full  TOTAL TIME TAKING CARE OF THIS PATIENT: 35 minutes.   Discussed with his daughter present in room.  POSSIBLE D/C IN 1-2 DAYS, DEPENDING ON CLINICAL CONDITION.   Vaughan Basta M.D on 10/15/2015   Between 7am to 6pm - Pager - [phone]  After 6pm go to [email or web address] - password [email or web address] [facility]  Office  [phone]  CC: Primary care physician; Kirk Ruths., MD  Note: This dictation was prepared with Dragon dictation along with smaller phrase technology. Any transcriptional errors that result from this process are unintentional.

## 2015-10-15 NOTE — NC FL2 (Signed)
[place] LEVEL OF CARE SCREENING TOOL     IDENTIFICATION  Patient Name: Gary Ortega Birthdate: 01-16-1925 Sex: male Admission Date (Current Location): 10/14/2015  [place] and [place] Number:  Engineering geologist and Address:  [facility], [address], [place], [place] [address]      Provider Number: [id number]  Attending Physician Name and Address:  Vaughan Basta, MD  Relative Name and Phone Number:       Current Level of Care: Hospital Recommended Level of Care: [place] Prior Approval Number:    Date Approved/Denied:   PASRR Number:  [id number])  Discharge Plan: SNF    Current Diagnoses: Patient Active Problem List   Diagnosis Date Noted  . Chills 10/15/2015  . Solitary kidney 09/15/2015  . S/p nephrectomy 09/15/2015  . Chronic kidney disease (CKD), stage IV (severe) ([place]) 09/15/2015  . Leukocytosis 09/15/2015  . Elevated troponin 09/15/2015  . Dysphagia 09/15/2015  . Carotid artery disease ([place]) 09/15/2015  . Acute cerebral infarction ([place])   . Ataxia 09/12/2015  . Weakness generalized 09/12/2015  . ARF (acute renal failure) ([place]) 09/12/2015  . Dehydration 09/12/2015  . ASHD  04/02/2015  . Depression, controlled 01/18/2015  . T2_NIDDM w/ CKD 4 (GFR 26 ml/min) 01/15/2015  . History of nephrectomy, unilateral 10/07/2014  . Parkinson's disease ([place]) 03/27/2014  . Medication management 11/29/2013  . BPH (benign prostatic hypertrophy) with urinary obstruction   . TIA (transient ischemic attack) 06/30/2011  . Hyperlipidemia 06/03/2010  . Vitamin D deficiency 06/02/2010  . Essential hypertension 06/02/2010    Orientation RESPIRATION BLADDER Height & Weight     Self, Time, Place  Normal Incontinent Weight: 163 lb 11.2 oz (74.254 kg) Height:  5\' 8"  (172.7 cm)  BEHAVIORAL SYMPTOMS/MOOD NEUROLOGICAL BOWEL NUTRITION STATUS   (None)  (None) Continent Diet (Regular)  AMBULATORY STATUS  COMMUNICATION OF NEEDS Skin   Extensive Assist Verbally Normal                       Personal Care Assistance Level of Assistance  Bathing, Feeding, Dressing Bathing Assistance: Limited assistance Feeding assistance: Independent Dressing Assistance: Limited assistance     Functional Limitations Info  Sight, Hearing, Speech Sight Info: Adequate Hearing Info: Adequate Speech Info: Adequate    SPECIAL CARE FACTORS FREQUENCY                       Contractures      Additional Factors Info  Code Status, Insulin Sliding Scale, Isolation Precautions Code Status Info:  (Full Code)     Insulin Sliding Scale Info:  (Ace Inhibitors, Lisinopril, Nsaids, Tolmetin, Wellbutrin) Isolation Precautions Info:  (Droplet precaution)     Current Medications (10/15/2015):  This is the current hospital active medication list Current Facility-Administered Medications  Medication Dose Route Frequency Provider Last Rate Last Dose  . 0.9 %  sodium chloride infusion   Intravenous Continuous Harrie Foreman, MD 125 mL/hr at 10/15/15 1337    . acetaminophen (TYLENOL) tablet 650 mg  650 mg Oral Q6H PRN Harrie Foreman, MD       Or  . acetaminophen (TYLENOL) suppository 650 mg  650 mg Rectal Q6H PRN Harrie Foreman, MD      . atorvastatin (LIPITOR) tablet 40 mg  40 mg Oral Daily Harrie Foreman, MD   40 mg at 10/15/15 1019  . bisacodyl (DULCOLAX) suppository 10 mg  10 mg Rectal Daily PRN  Harrie Foreman, MD      . carbidopa-levodopa (SINEMET IR) 25-250 MG per tablet immediate release 1 tablet  1 tablet Oral TID Harrie Foreman, MD   1 tablet at 10/15/15 1040  . cholecalciferol (VITAMIN D) tablet 5,000 Units  5,000 Units Oral Daily Harrie Foreman, MD   5,000 Units at 10/15/15 1019  . clopidogrel (PLAVIX) tablet 75 mg  75 mg Oral Daily Harrie Foreman, MD   75 mg at 10/15/15 1019  . docusate sodium (COLACE) capsule 100 mg  100 mg Oral BID Harrie Foreman, MD   100 mg at 10/15/15  1041  . famotidine (PEPCID) tablet 20 mg  20 mg Oral QHS Harrie Foreman, MD      . heparin injection 5,000 Units  5,000 Units Subcutaneous 3 times per day Harrie Foreman, MD   5,000 Units at 10/15/15 0557  . ketoconazole (NIZORAL) 2 % shampoo 1 application  1 application Topical Daily PRN Harrie Foreman, MD      . morphine 2 MG/ML injection 1 mg  1 mg Intravenous Q4H PRN Harrie Foreman, MD      . multivitamin with minerals tablet 1 tablet  1 tablet Oral Daily Harrie Foreman, MD   1 tablet at 10/15/15 1019  . nitroGLYCERIN (NITROSTAT) SL tablet 0.4 mg  0.4 mg Sublingual Q5 min PRN Harrie Foreman, MD      . omega-3 acid ethyl esters (LOVAZA) capsule 1 g  1 g Oral Daily Harrie Foreman, MD   1 g at 10/15/15 1020  . ondansetron (ZOFRAN) tablet 4 mg  4 mg Oral Q6H PRN Harrie Foreman, MD       Or  . ondansetron [facility]) injection 4 mg  4 mg Intravenous Q6H PRN Harrie Foreman, MD      . polyethylene glycol [facility] / GLYCOLAX) packet 17 g  17 g Oral Daily Harrie Foreman, MD   17 g at 10/15/15 1020  . vitamin C (ASCORBIC ACID) tablet 500 mg  500 mg Oral Daily Harrie Foreman, MD   500 mg at 10/15/15 1019     Discharge Medications: Please see discharge summary for a list of discharge medications.  Relevant Imaging Results:  Relevant Lab Results:   Additional Information  (SNN [id number])  Lorenso Quarry Sunkins, LCSW

## 2015-10-16 DIAGNOSIS — N179 Acute kidney failure, unspecified: Secondary | ICD-10-CM | POA: Diagnosis not present

## 2015-10-16 DIAGNOSIS — B349 Viral infection, unspecified: Secondary | ICD-10-CM | POA: Diagnosis not present

## 2015-10-16 DIAGNOSIS — G20 Parkinson's disease: Secondary | ICD-10-CM | POA: Diagnosis not present

## 2015-10-16 DIAGNOSIS — R6883 Chills (without fever): Secondary | ICD-10-CM | POA: Diagnosis not present

## 2015-10-16 DIAGNOSIS — M6281 Muscle weakness (generalized): Secondary | ICD-10-CM | POA: Diagnosis not present

## 2015-10-16 LAB — URINE CULTURE: Culture: NO GROWTH

## 2015-10-16 LAB — BASIC METABOLIC PANEL
Anion gap: 5 (ref 5–15)
BUN: 30 mg/dL — AB (ref 6–20)
CALCIUM: 8.4 mg/dL — AB (ref 8.9–10.3)
CHLORIDE: 115 mmol/L — AB (ref 101–111)
CO2: 20 mmol/L — AB (ref 22–32)
CREATININE: 2.15 mg/dL — AB (ref 0.61–1.24)
GFR calc non Af Amer: 26 mL/min — ABNORMAL LOW (ref >60)
GFR, EST AFRICAN AMERICAN: 30 mL/min — AB (ref >60)
Glucose, Bld: 99 mg/dL (ref 65–99)
Potassium: 4.6 mmol/L (ref 3.5–5.1)
SODIUM: 140 mmol/L (ref 135–145)

## 2015-10-16 MED ORDER — HALOPERIDOL LACTATE 5 MG/ML IJ SOLN
2.0000 mg | Freq: Once | INTRAMUSCULAR | Status: AC
  Administered 2015-10-16: 2 mg via INTRAVENOUS
  Filled 2015-10-16: qty 1

## 2015-10-16 MED ORDER — DIPHENHYDRAMINE HCL 50 MG/ML IJ SOLN
12.5000 mg | Freq: Once | INTRAMUSCULAR | Status: AC
  Administered 2015-10-16: 12.5 mg via INTRAVENOUS
  Filled 2015-10-16: qty 1

## 2015-10-16 NOTE — Evaluation (Signed)
Physical Therapy Evaluation Patient Details Name: Gary Ortega MRN: [id number] DOB: 12-May-1925 Today's Date: 10/16/2015   History of Present Illness  presented to ER and admitted under observation secondary to AMS, chills, lightheadedness and hypotension.  Clinical Impression  Upon evaluation, patient alert and oriented to self, location; generally confused to situation with noted deficits in STM, insight/awareness.  Patient strength generally weak and deconditioned, but functional for basic transfers and mobility (at least 3/5).  Able to complete bed mobility with min/mod assist; sit/stand, basic transfers and ambulation (54') with RW, cga/min assist.  Slow and deliberate in all movement transitions, but no overt buckling or LOB.  Mild desat (88%) with exertion on RA, but recovers to >90% within 30-45 seconds of seated rest. Would benefit from skilled PT to address above deficits and promote optimal return to PLOF; Recommend transition to ALF with HHPT upon discharge from acute hospitalization.     Follow Up Recommendations Home health PT    Equipment Recommendations       Recommendations for Other Services       Precautions / Restrictions Precautions Precautions: Fall Precaution Comments: Droplet isolation Restrictions Weight Bearing Restrictions: No      Mobility  Bed Mobility Overal bed mobility: Needs Assistance Bed Mobility: Supine to Sit     Supine to sit: Min assist;Mod assist        Transfers Overall transfer level: Needs assistance Equipment used: Rolling walker (2 wheeled) Transfers: Sit to/from Stand Sit to Stand: Min assist;Min guard         General transfer comment: requires use of bilat UEs, increased time, to complete  Ambulation/Gait Ambulation/Gait assistance: Min guard;Min assist Ambulation Distance (Feet): 45 Feet Assistive device: Rolling walker (2 wheeled)       General Gait Details: partially reciprocal stepping pattern; slow,  but fairly steady, cadence.  Increased turning radius; occasional cuing for walker placement  Stairs            Wheelchair Mobility    Modified Rankin (Stroke Patients Only)       Balance Overall balance assessment: Needs assistance Sitting-balance support: No upper extremity supported;Feet supported Sitting balance-Leahy Scale: Good     Standing balance support: Bilateral upper extremity supported Standing balance-Leahy Scale: Fair                               Pertinent Vitals/Pain Pain Assessment: No/denies pain    Home Living Family/patient expects to be discharged to:: Skilled nursing facility                 Additional Comments: ALF at [name]    Prior Function           Comments: Patient able to provide limited information (limited historian).  Appears to ambulate with RW, questionable level of assist for mobility and ADLs. Reports facility brings meals to/from room.     Hand Dominance        Extremity/Trunk Assessment   Upper Extremity Assessment: Overall WFL for tasks assessed (resting tremor to R UE; UE elevation limited to shoulder height)           Lower Extremity Assessment: Overall WFL for tasks assessed (grossly at least 3/5 throughout; full bilat TKE limited due to HS tightness)         Communication   Communication: No difficulties  Cognition Arousal/Alertness: Awake/alert Behavior During Therapy: WFL for tasks assessed/performed Overall Cognitive Status: Difficult to assess (oriented  to self, location; generally confused with noted deficits in STM, insight/awareness)                      General Comments      Exercises        Assessment/Plan    PT Assessment Patient needs continued PT services  PT Diagnosis Difficulty walking;Generalized weakness   PT Problem List Decreased strength;Decreased activity tolerance;Decreased balance;Decreased mobility;Decreased cognition;Decreased  knowledge of use of DME;Decreased safety awareness;Decreased knowledge of precautions;Cardiopulmonary status limiting activity  PT Treatment Interventions DME instruction;Gait training;Functional mobility training;Therapeutic activities;Therapeutic exercise;Balance training;Patient/family education;Cognitive remediation   PT Goals (Current goals can be found in the Care Plan section) Acute Rehab PT Goals Patient Stated Goal: limited ability to formulate goals PT Goal Formulation: With patient Time For Goal Achievement: 10/30/15 Potential to Achieve Goals: Good    Frequency Min 2X/week   Barriers to discharge        Co-evaluation               End of Session Equipment Utilized During Treatment: Gait belt Activity Tolerance: Patient tolerated treatment well Patient left: in bed;with call bell/phone within reach;with chair alarm set Nurse Communication: Mobility status    Functional Assessment Tool Used: clinical judgement Functional Limitation: Mobility: Walking and moving around Mobility: Walking and Moving Around Current Status [phone]): At least 20 percent but less than 40 percent impaired, limited or restricted Mobility: Walking and Moving Around Goal Status [phone]): At least 1 percent but less than 20 percent impaired, limited or restricted    Time: 1401-1423 PT Time Calculation (min) (ACUTE ONLY): 22 min   Charges:   PT Evaluation $PT Eval Low Complexity: 1 Procedure     PT G Codes:   PT G-Codes **NOT FOR INPATIENT CLASS** Functional Assessment Tool Used: clinical judgement Functional Limitation: Mobility: Walking and moving around Mobility: Walking and Moving Around Current Status [phone]): At least 20 percent but less than 40 percent impaired, limited or restricted Mobility: Walking and Moving Around Goal Status [phone]): At least 1 percent but less than 20 percent impaired, limited or restricted    Gary Ortega Gary Ortega, PT, DPT, NCS 10/16/2015, 3:59  PM [phone]

## 2015-10-16 NOTE — Progress Notes (Signed)
Patient discharged back to [name].  IV removed with tip in tact.  Report called to daughter Rosendo Gros and spoke to nurse Red at [name] with report, who expressed understanding of discharge instructions and medications.  Non urgent EMS to transport patient.  Haynes Hoehn RN.

## 2015-10-16 NOTE — Progress Notes (Signed)
Dr. Marcille Blanco called. Pt was hollering loudly for his daughter Jenny Reichmann. Pt confused as to where he was. Pt then began getting out of bed. Dr order haldol 2 mg.

## 2015-10-16 NOTE — NC FL2 (Signed)
[place] LEVEL OF CARE SCREENING TOOL     IDENTIFICATION  Patient Name: Gary Ortega Birthdate: [birth year] Sex: male Admission Date (Current Location): 10/14/2015  [place] and [place] Number:  Engineering geologist and Address:  [facility], [address] [place], [place] [address]      Provider Number: [id number]  Attending Physician Name and Address:  Vaughan Basta, MD  Relative Name and Phone Number:       Current Level of Care: Hospital Recommended Level of Care: [place] Prior Approval Number:    Date Approved/Denied:   PASRR Number:    Discharge Plan: SNF    Current Diagnoses: Patient Active Problem List   Diagnosis Date Noted  . Chills 10/15/2015  . Solitary kidney 09/15/2015  . S/p nephrectomy 09/15/2015  . Chronic kidney disease (CKD), stage IV (severe) ([place]) 09/15/2015  . Leukocytosis 09/15/2015  . Elevated troponin 09/15/2015  . Dysphagia 09/15/2015  . Carotid artery disease ([place]) 09/15/2015  . Acute cerebral infarction ([place])   . Ataxia 09/12/2015  . Weakness generalized 09/12/2015  . ARF (acute renal failure) ([place]) 09/12/2015  . Dehydration 09/12/2015  . ASHD  04/02/2015  . Depression, controlled 01/18/2015  . T2_NIDDM w/ CKD 4 (GFR 26 ml/min) 01/15/2015  . History of nephrectomy, unilateral 10/07/2014  . Parkinson's disease ([place]) 03/27/2014  . Medication management 11/29/2013  . BPH (benign prostatic hypertrophy) with urinary obstruction   . TIA (transient ischemic attack) 06/30/2011  . Hyperlipidemia 06/03/2010  . Vitamin D deficiency 06/02/2010  . Essential hypertension 06/02/2010    Orientation RESPIRATION BLADDER Height & Weight     Self  Normal Incontinent Weight: 164 lb 1.6 oz (74.435 kg) Height:  5\' 8"  (172.7 cm)  BEHAVIORAL SYMPTOMS/MOOD NEUROLOGICAL BOWEL NUTRITION STATUS   (None)  (None) Continent Diet (Regular)  AMBULATORY STATUS COMMUNICATION OF NEEDS Skin    Extensive Assist Verbally Normal                       Personal Care Assistance Level of Assistance  Bathing, Feeding, Dressing Bathing Assistance: Limited assistance Feeding assistance: Independent Dressing Assistance: Limited assistance     Functional Limitations Info  Sight, Hearing, Speech Sight Info: Adequate Hearing Info: Adequate Speech Info: Adequate    SPECIAL CARE FACTORS FREQUENCY                       Contractures      Additional Factors Info  Code Status, Insulin Sliding Scale, Isolation Precautions Code Status Info:  (Full Code)     Insulin Sliding Scale Info:  (Ace Inhibitors, Lisinopril, Nsaids, Tolmetin, Wellbutrin) Isolation Precautions Info:  (Droplet precaution)     DISCHARGE MEDICATIONS:   Current Discharge Medication List    CONTINUE these medications which have NOT CHANGED   Details  atorvastatin (LIPITOR) 40 MG tablet Take 40 mg by mouth daily.    bisacodyl (DULCOLAX) 10 MG suppository Place 1 suppository (10 mg total) rectally daily as needed for moderate constipation. Qty: 12 suppository, Refills: 0    carbidopa-levodopa (SINEMET IR) 25-250 MG tablet Take 1 tablet by mouth 3 (three) times daily. Qty: 270 tablet, Refills: 1   Associated Diagnoses: Parkinson's disease ([place])    Cholecalciferol (VITAMIN D3) 5000 units TABS Take 5,000 Units by mouth daily.    clopidogrel (PLAVIX) 75 MG tablet Take 75 mg by mouth daily.    docusate sodium (COLACE) 100 MG capsule Take 1 capsule (100  mg total) by mouth 2 (two) times daily. Qty: 10 capsule, Refills: 0    Emollient (EUCERIN EX) Apply 1 application topically 3 (three) times daily. Pt applies to back.    famotidine (PEPCID) 20 MG tablet Take 1 tablet (20 mg total) by mouth at bedtime. Qty: 30 tablet, Refills: 5    hydrocortisone butyrate (LUCOID) 0.1 % CREA cream Apply 1 application topically daily as needed (for rash on face).    ketoconazole  (NIZORAL) 2 % shampoo Apply 1 application topically daily as needed (for seborrheic dermatitis of scalp).     Multiple Vitamin (MULTIVITAMIN WITH MINERALS) TABS tablet Take 1 tablet by mouth daily.    nitroGLYCERIN (NITROSTAT) 0.4 MG SL tablet Place 0.4 mg under the tongue every 5 (five) minutes as needed for chest pain.    omega-3 acid ethyl esters (LOVAZA) 1 g capsule Take 1 g by mouth daily.    polyethylene glycol (MIRALAX / GLYCOLAX) packet Take 17 g by mouth daily. Qty: 14 each, Refills: 0    vitamin C (ASCORBIC ACID) 500 MG tablet Take 500 mg by mouth daily.    feeding supplement, ENSURE ENLIVE, (ENSURE ENLIVE) LIQD Take 237 mLs by mouth 2 (two) times daily between meals. Qty: 237 mL, Refills: 12              Discharge Medications: Please see discharge summary for a list of discharge medications.  Relevant Imaging Results:  Relevant Lab Results:   Additional Information  (SNN [id number])  Shela Leff, LCSW

## 2015-10-16 NOTE — Discharge Summary (Signed)
[facility] [place] NAME: Gary Ortega    MR#:  [id number]  DATE OF BIRTH:  04/12/1925  DATE OF ADMISSION:  10/14/2015 ADMITTING PHYSICIAN: Harrie Foreman, MD  DATE OF DISCHARGE: 10/16/2015  PRIMARY CARE PHYSICIAN: Kirk Ruths., MD    ADMISSION DIAGNOSIS:  Aphasia [R47.01] Confusion [R41.0] Hypotension, unspecified hypotension type [I95.9]  DISCHARGE DIAGNOSIS:  Active Problems:   Chills  Viral syndrome.  SECONDARY DIAGNOSIS:   Past Medical History  Diagnosis Date  . BPH (benign prostatic hypertrophy) with urinary obstruction   . CVA (cerebral infarction)   . Hypertension   . Parkinson disease ([place])   . Renal disorder   . Stroke ([place])   . Cancer [facility]     HOSPITAL COURSE:   1. Hypotension: The patient reports not eating well lately. He says he feels sick and is likely somewhat dehydrated. He is mildly hyponatremic likely secondary to hypovolemic. gently rehydrate with normal saline.   BP came up and in range on [id number] systolic, I do not recommend any Hypertensive meds at this time.   If needed, we will have to give any Htn meds except ACE, ARB or diuretics- as he have CKD.  2. Acute on chronic renal failure: He appears to have stage IV chronic kidney disease at baseline. Continue fluid resuscitation. Avoid nephrotoxic agents. he was on losartan- will stop it on d/c.   Creatinin 2.15 on discharge.  3. Chills: In the presence of leukocytosis appears patient may have a viral syndrome. Negative for influenza. Patient is on droplet precautions.   All the infection work ups negative, likely - it was viral syndrome.  4. Parkinson's disease: Continue Sinemet; no aphasia present. Slight garbling of words likely secondary to jaw muscle stiffness recently associated with Parkinsonian rigidity and/or difficulty with secretions 5. CAD: Stable continue Plavix 6. Hyperlipidemia: Continue Lipitor 7. DVT  prophylaxis: Heparin 8. GI prophylaxis: Pepcid per home regimen  DISCHARGE CONDITIONS:   Stable.  CONSULTS OBTAINED:     DRUG ALLERGIES:   Allergies  Allergen Reactions  . Ace Inhibitors Other (See Comments)    Reaction:  Causes kidney issues for pt   . Lisinopril Other (See Comments)    Reaction:  Causes kidney issues for pt  . Nsaids Other (See Comments)    Reaction:  Unknown   . Tolmetin Other (See Comments)    Reaction:  Unknown   . Wellbutrin [Bupropion] Other (See Comments)    Reaction:  Confusion     DISCHARGE MEDICATIONS:   Current Discharge Medication List    CONTINUE these medications which have NOT CHANGED   Details  atorvastatin (LIPITOR) 40 MG tablet Take 40 mg by mouth daily.    bisacodyl (DULCOLAX) 10 MG suppository Place 1 suppository (10 mg total) rectally daily as needed for moderate constipation. Qty: 12 suppository, Refills: 0    carbidopa-levodopa (SINEMET IR) 25-250 MG tablet Take 1 tablet by mouth 3 (three) times daily. Qty: 270 tablet, Refills: 1   Associated Diagnoses: Parkinson's disease ([place])    Cholecalciferol (VITAMIN D3) 5000 units TABS Take 5,000 Units by mouth daily.    clopidogrel (PLAVIX) 75 MG tablet Take 75 mg by mouth daily.    docusate sodium (COLACE) 100 MG capsule Take 1 capsule (100 mg total) by mouth 2 (two) times daily. Qty: 10 capsule, Refills: 0    Emollient (EUCERIN EX) Apply 1 application topically 3 (three) times daily. Pt applies to back.    famotidine (  PEPCID) 20 MG tablet Take 1 tablet (20 mg total) by mouth at bedtime. Qty: 30 tablet, Refills: 5    hydrocortisone butyrate (LUCOID) 0.1 % CREA cream Apply 1 application topically daily as needed (for rash on face).    ketoconazole (NIZORAL) 2 % shampoo Apply 1 application topically daily as needed (for seborrheic dermatitis of scalp).     Multiple Vitamin (MULTIVITAMIN WITH MINERALS) TABS tablet Take 1 tablet by mouth daily.    nitroGLYCERIN (NITROSTAT) 0.4 MG  SL tablet Place 0.4 mg under the tongue every 5 (five) minutes as needed for chest pain.    omega-3 acid ethyl esters (LOVAZA) 1 g capsule Take 1 g by mouth daily.    polyethylene glycol (MIRALAX / GLYCOLAX) packet Take 17 g by mouth daily. Qty: 14 each, Refills: 0    vitamin C (ASCORBIC ACID) 500 MG tablet Take 500 mg by mouth daily.    feeding supplement, ENSURE ENLIVE, (ENSURE ENLIVE) LIQD Take 237 mLs by mouth 2 (two) times daily between meals. Qty: 237 mL, Refills: 12         DISCHARGE INSTRUCTIONS:    Follow with PMD in 2 weeks to check renal function and blood pressure.  If you experience worsening of your admission symptoms, develop shortness of breath, life threatening emergency, suicidal or homicidal thoughts you must seek medical attention immediately by calling 911 or calling your MD immediately  if symptoms less severe.  You Must read complete instructions/literature along with all the possible adverse reactions/side effects for all the Medicines you take and that have been prescribed to you. Take any new Medicines after you have completely understood and accept all the possible adverse reactions/side effects.   Please note  You were cared for by a hospitalist during your hospital stay. If you have any questions about your discharge medications or the care you received while you were in the hospital after you are discharged, you can call the unit and asked to speak with the hospitalist on call if the hospitalist that took care of you is not available. Once you are discharged, your primary care physician will handle any further medical issues. Please note that NO REFILLS for any discharge medications will be authorized once you are discharged, as it is imperative that you return to your primary care physician (or establish a relationship with a primary care physician if you do not have one) for your aftercare needs so that they can reassess your need for medications and monitor  your lab values.    Today   CHIEF COMPLAINT:   Chief Complaint  Patient presents with  . Altered Mental Status    HISTORY OF PRESENT ILLNESS:  Gary Ortega  is a 82 y.o. male presents emergency department from his nursing home where he was supposedly disoriented. Upon my interview with the patient he complained of feeling chills and feeling lightheaded. In the emergency department initial workup was essentially negative. It is unclear if the patient felt some urinary retention but he has been catheterized. Urinalysis is negative for infection, however laboratory evaluation reveals mild leukocytosis. He is also mildly hypotensive which prompted the emergency department staff to call for admission.   VITAL SIGNS:  Blood pressure 160/76, pulse 88, temperature 97.8 F (36.6 C), temperature source Oral, resp. rate 20, height 5\' 8"  (1.727 m), weight 74.435 kg (164 lb 1.6 oz), SpO2 96 %.  I/O:   Intake/Output Summary (Last 24 hours) at 10/16/15 1438 Last data filed at 10/16/15 1300  Gross  per 24 hour  Intake 4099.37 ml  Output   3600 ml  Net 499.37 ml    PHYSICAL EXAMINATION:   GENERAL: 82 y.o.-year-old patient lying in the bed with no acute distress.  EYES: Pupils equal, round, reactive to light and accommodation. No scleral icterus. Extraocular muscles intact.  HEENT: Head atraumatic, normocephalic. Oropharynx and nasopharynx clear.  NECK: Supple, no jugular venous distention. No thyroid enlargement, no tenderness.  LUNGS: Normal breath sounds bilaterally, no wheezing, rales,rhonchi or crepitation. No use of accessory muscles of respiration.  CARDIOVASCULAR: S1, S2 normal. No murmurs, rubs, or gallops.  ABDOMEN: Soft, nontender, nondistended. Bowel sounds present. No organomegaly or mass. Foley in place. EXTREMITIES: No pedal edema, cyanosis, or clubbing.  NEUROLOGIC: Cranial nerves II through XII are intact. Muscle strength 5/5 in all extremities. Sensation intact.  Gait not checked.  PSYCHIATRIC: The patient is alert and oriented x 3.  SKIN: No obvious rash, lesion, or ulcer.   DATA REVIEW:   CBC  Recent Labs Lab 10/14/15 1915  WBC 10.8*  HGB 12.2*  HCT 36.3*  PLT 244    Chemistries   Recent Labs Lab 10/14/15 1915 10/16/15 0455  NA 131* 140  K 4.5 4.6  CL 107 115*  CO2 19* 20*  GLUCOSE 134* 99  BUN 46* 30*  CREATININE 3.00* 2.15*  CALCIUM 8.3* 8.4*  AST 30  --   ALT 7*  --   ALKPHOS 55  --   BILITOT <0.1*  --     Cardiac Enzymes  Recent Labs Lab 10/14/15 1915  TROPONINI <0.03    Microbiology Results  Results for orders placed or performed during the hospital encounter of 10/14/15  Urine culture     Status: None   Collection Time: 10/14/15  7:44 PM  Result Value Ref Range Status   Specimen Description URINE, CLEAN CATCH  Final   Special Requests NONE  Final   Culture NO GROWTH 2 DAYS  Final   Report Status 10/16/2015 FINAL  Final  Culture, blood (routine x 2)     Status: None (Preliminary result)   Collection Time: 10/14/15  8:27 PM  Result Value Ref Range Status   Specimen Description BLOOD RIGHT FATTY CASTS  Final   Special Requests   Final    BOTTLES DRAWN AEROBIC AND ANAEROBIC  [place]   Culture NO GROWTH < 12 HOURS  Final   Report Status PENDING  Incomplete  Culture, blood (routine x 2)     Status: None (Preliminary result)   Collection Time: 10/14/15  8:27 PM  Result Value Ref Range Status   Specimen Description BLOOD LEFT ASSIST CONTROL  Final   Special Requests   Final    BOTTLES DRAWN AEROBIC AND ANAEROBIC  2CCAERO,4CCANA   Culture NO GROWTH < 12 HOURS  Final   Report Status PENDING  Incomplete  Rapid Influenza A&B Antigens ([place] only)     Status: None   Collection Time: 10/15/15  2:31 AM  Result Value Ref Range Status   Influenza A ([facility]) NEGATIVE NEGATIVE Final   Influenza B ([facility]) NEGATIVE NEGATIVE Final  MRSA PCR Screening     Status: None   Collection Time: 10/15/15  6:17 AM   Result Value Ref Range Status   MRSA by PCR NEGATIVE NEGATIVE Final    Comment:        The GeneXpert MRSA Assay (FDA approved for NASAL specimens only), is one component of a comprehensive MRSA colonization surveillance program. It is not intended to  diagnose MRSA infection nor to guide or monitor treatment for MRSA infections.     RADIOLOGY:  Dg Chest 1 View  10/14/2015  CLINICAL DATA:  Altered mental status EXAM: CHEST 1 VIEW COMPARISON:  September 12, 2015 FINDINGS: There is bibasilar atelectasis. Lungs elsewhere clear. Heart is upper normal in size with pulmonary vascularity within normal limits. No adenopathy. No bone lesions. IMPRESSION: Bibasilar atelectasis. Lungs elsewhere clear. No change in cardiac silhouette. Electronically Signed   By: Lowella Grip III M.D.   On: 10/14/2015 20:00   Ct Head Wo Contrast  10/14/2015  CLINICAL DATA:  Altered mental status. History of Parkinson's disease EXAM: CT HEAD WITHOUT CONTRAST TECHNIQUE: Contiguous axial images were obtained from the base of the skull through the vertex without intravenous contrast. COMPARISON:  October 02, 2015 FINDINGS: Moderate diffuse atrophy is stable. There is a stable calcified right midportion parafalcine meningioma measuring 1.2 x 1.0 cm without appreciable surrounding edema. There is no other mass. There is no hemorrhage, extra-axial fluid collection, or midline shift. There is evidence of an old infarct in the inferior medial right cerebellum, stable. There is small vessel disease throughout the centra semiovale bilaterally. There is evidence of a prior lacunar infarct in the head of the caudate nucleus on the left. There is small vessel disease in the external capsule anteriorly on the left. No new gray-white compartment lesions are evident. No acute infarct evident. The bony calvarium appears intact. The mastoid air cells are clear. No intraorbital lesions are apparent. IMPRESSION: Atrophy with extensive small  vessel disease. Prior right inferomedial cerebellar infarct. Prior small infarct in the head of the caudate nucleus on the left as well as small vessel disease in the external capsule on the left anteriorly. No acute infarct. No hemorrhage. Stable calcified right parafalcine meningioma without surrounding edema. Electronically Signed   By: Lowella Grip III M.D.   On: 10/14/2015 20:30     Management plans discussed with the patient, family and they are in agreement.  CODE STATUS:     Code Status Orders        Start     Ordered   10/15/15 0526  Full code   Continuous     10/15/15 0525    Code Status History    Date Active Date Inactive Code Status Order ID Comments User Context   09/13/2015  1:31 AM 09/15/2015  6:45 PM Full Code [id number]  Idelle Crouch, MD Inpatient      TOTAL TIME TAKING CARE OF THIS PATIENT: 35 minutes.    Vaughan Basta M.D on 10/16/2015 at 2:38 PM  Between 7am to 6pm - Pager - [phone]  After 6pm go to [email or web address] - password [email or web address] [facility]  Office  [phone]  CC: Primary care physician; Kirk Ruths., MD   Note: This dictation was prepared with Dragon dictation along with smaller phrase technology. Any transcriptional errors that result from this process are unintentional.

## 2015-10-16 NOTE — Progress Notes (Signed)
Dr. Marcille Blanco contacted again. Pt is continuing to jump out of bed and remains very confused. Haldol and benadryl ordered.

## 2015-10-16 NOTE — Clinical Social Work Note (Signed)
Clinical Social Work Assessment  Patient Details  Name: Gary Ortega MRN: [id number] Date of Birth: 1925/01/25  Date of referral:  10/16/15               Reason for consult:  Facility Placement                Permission sought to share information with:    Permission granted to share information::     Name::        Agency::     Relationship::     Contact Information:     Housing/Transportation Living arrangements for the past 2 months:  [place] of Information:  Adult Children Patient Interpreter Needed:  None Criminal Activity/Legal Involvement Pertinent to Current Situation/Hospitalization:  No - Comment as needed Significant Relationships:  Adult Children Lives with:  Facility Resident Do you feel safe going back to the place where you live?  Yes Need for family participation in patient care:  Yes (Comment)  Care giving concerns:  Patient resides at [facility].  Social Worker assessment / plan:  MD ready to discharge patient today. PT has stated patient can return to his previous level of care at the ALF. CSW has spoken to both [place] and [place] at [name]. They state that they can take patient back to the assisted living side of [name]. Discharge information has been sent. PT recommended home health but Magda Paganini at [name] stated that they will get their physical therapists to go see patient at the ALF and that we will not have to set anything up. CSW has spoken to patient's daughter, [name], and updated her. She requests transport via EMS. Nurse to call report and discharge information sent.  Employment status:  Retired Forensic scientist:  Medicare PT Recommendations:  Home with [place] / Referral to community resources:     Patient/Family's Response to care:  Patient's family expressed appreciation for CSW assistance.  Patient/Family's Understanding of and Emotional Response to Diagnosis,  Current Treatment, and Prognosis:  Patient's family is speaking to patient's nurse to discuss discharge medications so they know what patient is receiving at [facility].  Emotional Assessment Appearance:  Appears stated age Attitude/Demeanor/Rapport:   (confused and restless) Affect (typically observed):    Orientation:  Oriented to Self Alcohol / Substance use:  Not Applicable Psych involvement (Current and /or in the community):  No (Comment)  Discharge Needs  Concerns to be addressed:  Care Coordination Readmission within the last 30 days:  No Current discharge risk:  None Barriers to Discharge:  No Barriers Identified   Gary Leff, LCSW 10/16/2015, 3:18 PM

## 2015-10-16 NOTE — Care Management (Signed)
Patient to be discharged and return to [facility] today.  PT has recommend home health PT.  Brayton Layman CSW has confirmed with [name]  that rehab staff from the facility will continue PT with the patient.

## 2015-10-16 NOTE — Progress Notes (Signed)
[facility] [place] NAME: Gary Ortega    MR#:  [id number]  DATE OF BIRTH:  Apr 23, 1925  DATE OF ADMISSION:  10/14/2015 ADMITTING PHYSICIAN: Harrie Foreman, MD  DATE OF DISCHARGE: 10/16/2015  PRIMARY CARE PHYSICIAN: Kirk Ruths., MD    ADMISSION DIAGNOSIS:  Aphasia [R47.01] Confusion [R41.0] Hypotension, unspecified hypotension type [I95.9]  DISCHARGE DIAGNOSIS:  Active Problems:   Chills  Viral syndrome.  SECONDARY DIAGNOSIS:   Past Medical History  Diagnosis Date  . BPH (benign prostatic hypertrophy) with urinary obstruction   . CVA (cerebral infarction)   . Hypertension   . Parkinson disease ([place])   . Renal disorder   . Stroke ([place])   . Cancer [facility])     HOSPITAL COURSE:   1. Hypotension: The patient reports not eating well lately. He says he feels sick and is likely somewhat dehydrated. He is mildly hyponatremic likely secondary to hypovolemic. gently rehydrate with normal saline.   BP came up and in range on [id number] systolic, I do not recommend any Hypertensive meds at this time.   If needed, we will have to give any Htn meds except ACE, ARB or diuretics- as he have CKD.  2. Acute on chronic renal failure: He appears to have stage IV chronic kidney disease at baseline. Continue fluid resuscitation. Avoid nephrotoxic agents. he was on losartan- will stop it on d/c.   Creatinin 2.15 on discharge.  3. Chills: In the presence of leukocytosis appears patient may have a viral syndrome. Negative for influenza. Patient is on droplet precautions.   All the infection work ups negative, likely - it was viral syndrome.  4. Parkinson's disease: Continue Sinemet; no aphasia present. Slight garbling of words likely secondary to jaw muscle stiffness recently associated with Parkinsonian rigidity and/or difficulty with secretions 5. CAD: Stable continue Plavix 6. Hyperlipidemia: Continue Lipitor 7. DVT  prophylaxis: Heparin 8. GI prophylaxis: Pepcid per home regimen  DISCHARGE CONDITIONS:   Stable.  CONSULTS OBTAINED:     DRUG ALLERGIES:   Allergies  Allergen Reactions  . Ace Inhibitors Other (See Comments)    Reaction:  Causes kidney issues for pt   . Lisinopril Other (See Comments)    Reaction:  Causes kidney issues for pt  . Nsaids Other (See Comments)    Reaction:  Unknown   . Tolmetin Other (See Comments)    Reaction:  Unknown   . Wellbutrin [Bupropion] Other (See Comments)    Reaction:  Confusion     DISCHARGE MEDICATIONS:   Current Discharge Medication List    CONTINUE these medications which have NOT CHANGED   Details  atorvastatin (LIPITOR) 40 MG tablet Take 40 mg by mouth daily.    bisacodyl (DULCOLAX) 10 MG suppository Place 1 suppository (10 mg total) rectally daily as needed for moderate constipation. Qty: 12 suppository, Refills: 0    carbidopa-levodopa (SINEMET IR) 25-250 MG tablet Take 1 tablet by mouth 3 (three) times daily. Qty: 270 tablet, Refills: 1   Associated Diagnoses: Parkinson's disease ([place])    Cholecalciferol (VITAMIN D3) 5000 units TABS Take 5,000 Units by mouth daily.    clopidogrel (PLAVIX) 75 MG tablet Take 75 mg by mouth daily.    docusate sodium (COLACE) 100 MG capsule Take 1 capsule (100 mg total) by mouth 2 (two) times daily. Qty: 10 capsule, Refills: 0    Emollient (EUCERIN EX) Apply 1 application topically 3 (three) times daily. Pt applies to back.    famotidine (  PEPCID) 20 MG tablet Take 1 tablet (20 mg total) by mouth at bedtime. Qty: 30 tablet, Refills: 5    hydrocortisone butyrate (LUCOID) 0.1 % CREA cream Apply 1 application topically daily as needed (for rash on face).    ketoconazole (NIZORAL) 2 % shampoo Apply 1 application topically daily as needed (for seborrheic dermatitis of scalp).     Multiple Vitamin (MULTIVITAMIN WITH MINERALS) TABS tablet Take 1 tablet by mouth daily.    nitroGLYCERIN (NITROSTAT) 0.4 MG  SL tablet Place 0.4 mg under the tongue every 5 (five) minutes as needed for chest pain.    omega-3 acid ethyl esters (LOVAZA) 1 g capsule Take 1 g by mouth daily.    polyethylene glycol (MIRALAX / GLYCOLAX) packet Take 17 g by mouth daily. Qty: 14 each, Refills: 0    vitamin C (ASCORBIC ACID) 500 MG tablet Take 500 mg by mouth daily.    feeding supplement, ENSURE ENLIVE, (ENSURE ENLIVE) LIQD Take 237 mLs by mouth 2 (two) times daily between meals. Qty: 237 mL, Refills: 12         DISCHARGE INSTRUCTIONS:    Follow with PMD in 2 weeks to check renal function and blood pressure.  If you experience worsening of your admission symptoms, develop shortness of breath, life threatening emergency, suicidal or homicidal thoughts you must seek medical attention immediately by calling 911 or calling your MD immediately  if symptoms less severe.  You Must read complete instructions/literature along with all the possible adverse reactions/side effects for all the Medicines you take and that have been prescribed to you. Take any new Medicines after you have completely understood and accept all the possible adverse reactions/side effects.   Please note  You were cared for by a hospitalist during your hospital stay. If you have any questions about your discharge medications or the care you received while you were in the hospital after you are discharged, you can call the unit and asked to speak with the hospitalist on call if the hospitalist that took care of you is not available. Once you are discharged, your primary care physician will handle any further medical issues. Please note that NO REFILLS for any discharge medications will be authorized once you are discharged, as it is imperative that you return to your primary care physician (or establish a relationship with a primary care physician if you do not have one) for your aftercare needs so that they can reassess your need for medications and monitor  your lab values.    Today   CHIEF COMPLAINT:   Chief Complaint  Patient presents with  . Altered Mental Status    HISTORY OF PRESENT ILLNESS:  Gary Ortega  is a 82 y.o. male presents emergency department from his nursing home where he was supposedly disoriented. Upon my interview with the patient he complained of feeling chills and feeling lightheaded. In the emergency department initial workup was essentially negative. It is unclear if the patient felt some urinary retention but he has been catheterized. Urinalysis is negative for infection, however laboratory evaluation reveals mild leukocytosis. He is also mildly hypotensive which prompted the emergency department staff to call for admission.   VITAL SIGNS:  Blood pressure 160/76, pulse 88, temperature 97.8 F (36.6 C), temperature source Oral, resp. rate 20, height 5\' 8"  (1.727 m), weight 74.435 kg (164 lb 1.6 oz), SpO2 96 %.  I/O:   Intake/Output Summary (Last 24 hours) at 10/16/15 1438 Last data filed at 10/16/15 1300  Gross  per 24 hour  Intake 4099.37 ml  Output   3600 ml  Net 499.37 ml    PHYSICAL EXAMINATION:   GENERAL: 82 y.o.-year-old patient lying in the bed with no acute distress.  EYES: Pupils equal, round, reactive to light and accommodation. No scleral icterus. Extraocular muscles intact.  HEENT: Head atraumatic, normocephalic. Oropharynx and nasopharynx clear.  NECK: Supple, no jugular venous distention. No thyroid enlargement, no tenderness.  LUNGS: Normal breath sounds bilaterally, no wheezing, rales,rhonchi or crepitation. No use of accessory muscles of respiration.  CARDIOVASCULAR: S1, S2 normal. No murmurs, rubs, or gallops.  ABDOMEN: Soft, nontender, nondistended. Bowel sounds present. No organomegaly or mass. Foley in place. EXTREMITIES: No pedal edema, cyanosis, or clubbing.  NEUROLOGIC: Cranial nerves II through XII are intact. Muscle strength 5/5 in all extremities. Sensation intact.  Gait not checked.  PSYCHIATRIC: The patient is alert and oriented x 3.  SKIN: No obvious rash, lesion, or ulcer.   DATA REVIEW:   CBC  Recent Labs Lab 10/14/15 1915  WBC 10.8*  HGB 12.2*  HCT 36.3*  PLT 244    Chemistries   Recent Labs Lab 10/14/15 1915 10/16/15 0455  NA 131* 140  K 4.5 4.6  CL 107 115*  CO2 19* 20*  GLUCOSE 134* 99  BUN 46* 30*  CREATININE 3.00* 2.15*  CALCIUM 8.3* 8.4*  AST 30  --   ALT 7*  --   ALKPHOS 55  --   BILITOT <0.1*  --     Cardiac Enzymes  Recent Labs Lab 10/14/15 1915  TROPONINI <0.03    Microbiology Results  Results for orders placed or performed during the hospital encounter of 10/14/15  Urine culture     Status: None   Collection Time: 10/14/15  7:44 PM  Result Value Ref Range Status   Specimen Description URINE, CLEAN CATCH  Final   Special Requests NONE  Final   Culture NO GROWTH 2 DAYS  Final   Report Status 10/16/2015 FINAL  Final  Culture, blood (routine x 2)     Status: None (Preliminary result)   Collection Time: 10/14/15  8:27 PM  Result Value Ref Range Status   Specimen Description BLOOD RIGHT FATTY CASTS  Final   Special Requests   Final    BOTTLES DRAWN AEROBIC AND ANAEROBIC  [place]   Culture NO GROWTH < 12 HOURS  Final   Report Status PENDING  Incomplete  Culture, blood (routine x 2)     Status: None (Preliminary result)   Collection Time: 10/14/15  8:27 PM  Result Value Ref Range Status   Specimen Description BLOOD LEFT ASSIST CONTROL  Final   Special Requests   Final    BOTTLES DRAWN AEROBIC AND ANAEROBIC  2CCAERO,4CCANA   Culture NO GROWTH < 12 HOURS  Final   Report Status PENDING  Incomplete  Rapid Influenza A&B Antigens ([place] only)     Status: None   Collection Time: 10/15/15  2:31 AM  Result Value Ref Range Status   Influenza A ([facility]) NEGATIVE NEGATIVE Final   Influenza B ([facility]) NEGATIVE NEGATIVE Final  MRSA PCR Screening     Status: None   Collection Time: 10/15/15  6:17 AM   Result Value Ref Range Status   MRSA by PCR NEGATIVE NEGATIVE Final    Comment:        The GeneXpert MRSA Assay (FDA approved for NASAL specimens only), is one component of a comprehensive MRSA colonization surveillance program. It is not intended to  diagnose MRSA infection nor to guide or monitor treatment for MRSA infections.     RADIOLOGY:  Dg Chest 1 View  10/14/2015  CLINICAL DATA:  Altered mental status EXAM: CHEST 1 VIEW COMPARISON:  September 12, 2015 FINDINGS: There is bibasilar atelectasis. Lungs elsewhere clear. Heart is upper normal in size with pulmonary vascularity within normal limits. No adenopathy. No bone lesions. IMPRESSION: Bibasilar atelectasis. Lungs elsewhere clear. No change in cardiac silhouette. Electronically Signed   By: Lowella Grip III M.D.   On: 10/14/2015 20:00   Ct Head Wo Contrast  10/14/2015  CLINICAL DATA:  Altered mental status. History of Parkinson's disease EXAM: CT HEAD WITHOUT CONTRAST TECHNIQUE: Contiguous axial images were obtained from the base of the skull through the vertex without intravenous contrast. COMPARISON:  October 02, 2015 FINDINGS: Moderate diffuse atrophy is stable. There is a stable calcified right midportion parafalcine meningioma measuring 1.2 x 1.0 cm without appreciable surrounding edema. There is no other mass. There is no hemorrhage, extra-axial fluid collection, or midline shift. There is evidence of an old infarct in the inferior medial right cerebellum, stable. There is small vessel disease throughout the centra semiovale bilaterally. There is evidence of a prior lacunar infarct in the head of the caudate nucleus on the left. There is small vessel disease in the external capsule anteriorly on the left. No new gray-white compartment lesions are evident. No acute infarct evident. The bony calvarium appears intact. The mastoid air cells are clear. No intraorbital lesions are apparent. IMPRESSION: Atrophy with extensive small  vessel disease. Prior right inferomedial cerebellar infarct. Prior small infarct in the head of the caudate nucleus on the left as well as small vessel disease in the external capsule on the left anteriorly. No acute infarct. No hemorrhage. Stable calcified right parafalcine meningioma without surrounding edema. Electronically Signed   By: Lowella Grip III M.D.   On: 10/14/2015 20:30     Management plans discussed with the patient, family and they are in agreement.  CODE STATUS:     Code Status Orders        Start     Ordered   10/15/15 0526  Full code   Continuous     10/15/15 0525    Code Status History    Date Active Date Inactive Code Status Order ID Comments User Context   09/13/2015  1:31 AM 09/15/2015  6:45 PM Full Code [id number]  Idelle Crouch, MD Inpatient      TOTAL TIME TAKING CARE OF THIS PATIENT: 35 minutes.    Vaughan Basta M.D on 10/16/2015 at 2:38 PM  Between 7am to 6pm - Pager - [phone]  After 6pm go to [email or web address] - password [email or web address] [place] [facility]  Office  [phone]  CC: Primary care physician; Kirk Ruths., MD   Note: This dictation was prepared with Dragon dictation along with smaller phrase technology. Any transcriptional errors that result from this process are unintentional.

## 2015-10-19 LAB — CULTURE, BLOOD (ROUTINE X 2)
CULTURE: NO GROWTH
Culture: NO GROWTH

## 2015-10-20 DIAGNOSIS — G9340 Encephalopathy, unspecified: Secondary | ICD-10-CM | POA: Diagnosis not present

## 2015-10-20 DIAGNOSIS — E785 Hyperlipidemia, unspecified: Secondary | ICD-10-CM | POA: Diagnosis not present

## 2015-10-20 DIAGNOSIS — I6350 Cerebral infarction due to unspecified occlusion or stenosis of unspecified cerebral artery: Secondary | ICD-10-CM | POA: Diagnosis not present

## 2015-10-20 DIAGNOSIS — I69393 Ataxia following cerebral infarction: Secondary | ICD-10-CM | POA: Diagnosis not present

## 2015-10-20 DIAGNOSIS — N184 Chronic kidney disease, stage 4 (severe): Secondary | ICD-10-CM | POA: Diagnosis not present

## 2015-10-20 DIAGNOSIS — K259 Gastric ulcer, unspecified as acute or chronic, without hemorrhage or perforation: Secondary | ICD-10-CM | POA: Diagnosis not present

## 2015-10-20 DIAGNOSIS — G20 Parkinson's disease: Secondary | ICD-10-CM | POA: Diagnosis not present

## 2015-10-20 DIAGNOSIS — I129 Hypertensive chronic kidney disease with stage 1 through stage 4 chronic kidney disease, or unspecified chronic kidney disease: Secondary | ICD-10-CM | POA: Diagnosis not present

## 2015-10-20 DIAGNOSIS — Z7901 Long term (current) use of anticoagulants: Secondary | ICD-10-CM | POA: Diagnosis not present

## 2015-10-20 DIAGNOSIS — N390 Urinary tract infection, site not specified: Secondary | ICD-10-CM | POA: Diagnosis not present

## 2015-10-20 DIAGNOSIS — R531 Weakness: Secondary | ICD-10-CM | POA: Diagnosis not present

## 2015-10-20 DIAGNOSIS — I69398 Other sequelae of cerebral infarction: Secondary | ICD-10-CM | POA: Diagnosis not present

## 2015-10-20 DIAGNOSIS — I10 Essential (primary) hypertension: Secondary | ICD-10-CM | POA: Diagnosis not present

## 2015-10-20 DIAGNOSIS — E1122 Type 2 diabetes mellitus with diabetic chronic kidney disease: Secondary | ICD-10-CM | POA: Diagnosis not present

## 2015-10-20 DIAGNOSIS — M6281 Muscle weakness (generalized): Secondary | ICD-10-CM | POA: Diagnosis not present

## 2015-10-20 DIAGNOSIS — I6529 Occlusion and stenosis of unspecified carotid artery: Secondary | ICD-10-CM | POA: Diagnosis not present

## 2015-10-20 DIAGNOSIS — I639 Cerebral infarction, unspecified: Secondary | ICD-10-CM | POA: Diagnosis not present

## 2015-10-23 DIAGNOSIS — G20 Parkinson's disease: Secondary | ICD-10-CM | POA: Diagnosis not present

## 2015-10-23 DIAGNOSIS — G9340 Encephalopathy, unspecified: Secondary | ICD-10-CM | POA: Diagnosis not present

## 2015-10-23 DIAGNOSIS — I6350 Cerebral infarction due to unspecified occlusion or stenosis of unspecified cerebral artery: Secondary | ICD-10-CM | POA: Diagnosis not present

## 2015-10-23 DIAGNOSIS — R531 Weakness: Secondary | ICD-10-CM | POA: Diagnosis not present

## 2015-10-27 DIAGNOSIS — I639 Cerebral infarction, unspecified: Secondary | ICD-10-CM | POA: Diagnosis not present

## 2015-10-27 DIAGNOSIS — N390 Urinary tract infection, site not specified: Secondary | ICD-10-CM | POA: Diagnosis not present

## 2015-10-27 DIAGNOSIS — I10 Essential (primary) hypertension: Secondary | ICD-10-CM | POA: Diagnosis not present

## 2015-10-29 DIAGNOSIS — N401 Enlarged prostate with lower urinary tract symptoms: Secondary | ICD-10-CM | POA: Diagnosis not present

## 2015-10-29 DIAGNOSIS — N179 Acute kidney failure, unspecified: Secondary | ICD-10-CM | POA: Diagnosis not present

## 2015-10-29 DIAGNOSIS — E559 Vitamin D deficiency, unspecified: Secondary | ICD-10-CM | POA: Diagnosis not present

## 2015-10-29 DIAGNOSIS — G20 Parkinson's disease: Secondary | ICD-10-CM | POA: Diagnosis not present

## 2015-10-29 DIAGNOSIS — I129 Hypertensive chronic kidney disease with stage 1 through stage 4 chronic kidney disease, or unspecified chronic kidney disease: Secondary | ICD-10-CM | POA: Diagnosis not present

## 2015-10-29 DIAGNOSIS — I69393 Ataxia following cerebral infarction: Secondary | ICD-10-CM | POA: Diagnosis not present

## 2015-10-29 DIAGNOSIS — R1310 Dysphagia, unspecified: Secondary | ICD-10-CM | POA: Diagnosis not present

## 2015-10-29 DIAGNOSIS — F329 Major depressive disorder, single episode, unspecified: Secondary | ICD-10-CM | POA: Diagnosis not present

## 2015-10-29 DIAGNOSIS — E1122 Type 2 diabetes mellitus with diabetic chronic kidney disease: Secondary | ICD-10-CM | POA: Diagnosis not present

## 2015-10-29 DIAGNOSIS — E785 Hyperlipidemia, unspecified: Secondary | ICD-10-CM | POA: Diagnosis not present

## 2015-10-29 DIAGNOSIS — N184 Chronic kidney disease, stage 4 (severe): Secondary | ICD-10-CM | POA: Diagnosis not present

## 2015-10-29 DIAGNOSIS — I6522 Occlusion and stenosis of left carotid artery: Secondary | ICD-10-CM | POA: Diagnosis not present

## 2015-10-30 DIAGNOSIS — G20 Parkinson's disease: Secondary | ICD-10-CM | POA: Diagnosis not present

## 2015-10-30 DIAGNOSIS — R460 Very low level of personal hygiene: Secondary | ICD-10-CM | POA: Diagnosis not present

## 2015-10-30 DIAGNOSIS — R1310 Dysphagia, unspecified: Secondary | ICD-10-CM | POA: Diagnosis not present

## 2015-10-30 DIAGNOSIS — I129 Hypertensive chronic kidney disease with stage 1 through stage 4 chronic kidney disease, or unspecified chronic kidney disease: Secondary | ICD-10-CM | POA: Diagnosis not present

## 2015-10-30 DIAGNOSIS — R296 Repeated falls: Secondary | ICD-10-CM | POA: Diagnosis not present

## 2015-10-30 DIAGNOSIS — N179 Acute kidney failure, unspecified: Secondary | ICD-10-CM | POA: Diagnosis not present

## 2015-10-30 DIAGNOSIS — N184 Chronic kidney disease, stage 4 (severe): Secondary | ICD-10-CM | POA: Diagnosis not present

## 2015-10-30 DIAGNOSIS — E1122 Type 2 diabetes mellitus with diabetic chronic kidney disease: Secondary | ICD-10-CM | POA: Diagnosis not present

## 2015-10-30 DIAGNOSIS — I69393 Ataxia following cerebral infarction: Secondary | ICD-10-CM | POA: Diagnosis not present

## 2015-10-30 DIAGNOSIS — I2510 Atherosclerotic heart disease of native coronary artery without angina pectoris: Secondary | ICD-10-CM | POA: Diagnosis not present

## 2015-11-02 DIAGNOSIS — I129 Hypertensive chronic kidney disease with stage 1 through stage 4 chronic kidney disease, or unspecified chronic kidney disease: Secondary | ICD-10-CM | POA: Diagnosis not present

## 2015-11-02 DIAGNOSIS — N179 Acute kidney failure, unspecified: Secondary | ICD-10-CM | POA: Diagnosis not present

## 2015-11-02 DIAGNOSIS — E1122 Type 2 diabetes mellitus with diabetic chronic kidney disease: Secondary | ICD-10-CM | POA: Diagnosis not present

## 2015-11-02 DIAGNOSIS — R1310 Dysphagia, unspecified: Secondary | ICD-10-CM | POA: Diagnosis not present

## 2015-11-02 DIAGNOSIS — N184 Chronic kidney disease, stage 4 (severe): Secondary | ICD-10-CM | POA: Diagnosis not present

## 2015-11-02 DIAGNOSIS — I69393 Ataxia following cerebral infarction: Secondary | ICD-10-CM | POA: Diagnosis not present

## 2015-11-03 DIAGNOSIS — R1310 Dysphagia, unspecified: Secondary | ICD-10-CM | POA: Diagnosis not present

## 2015-11-03 DIAGNOSIS — N184 Chronic kidney disease, stage 4 (severe): Secondary | ICD-10-CM | POA: Diagnosis not present

## 2015-11-03 DIAGNOSIS — E1122 Type 2 diabetes mellitus with diabetic chronic kidney disease: Secondary | ICD-10-CM | POA: Diagnosis not present

## 2015-11-03 DIAGNOSIS — N179 Acute kidney failure, unspecified: Secondary | ICD-10-CM | POA: Diagnosis not present

## 2015-11-03 DIAGNOSIS — I129 Hypertensive chronic kidney disease with stage 1 through stage 4 chronic kidney disease, or unspecified chronic kidney disease: Secondary | ICD-10-CM | POA: Diagnosis not present

## 2015-11-03 DIAGNOSIS — I69393 Ataxia following cerebral infarction: Secondary | ICD-10-CM | POA: Diagnosis not present

## 2015-11-04 DIAGNOSIS — I129 Hypertensive chronic kidney disease with stage 1 through stage 4 chronic kidney disease, or unspecified chronic kidney disease: Secondary | ICD-10-CM | POA: Diagnosis not present

## 2015-11-04 DIAGNOSIS — I639 Cerebral infarction, unspecified: Secondary | ICD-10-CM | POA: Diagnosis not present

## 2015-11-04 DIAGNOSIS — R1310 Dysphagia, unspecified: Secondary | ICD-10-CM | POA: Diagnosis not present

## 2015-11-04 DIAGNOSIS — I69393 Ataxia following cerebral infarction: Secondary | ICD-10-CM | POA: Diagnosis not present

## 2015-11-04 DIAGNOSIS — N179 Acute kidney failure, unspecified: Secondary | ICD-10-CM | POA: Diagnosis not present

## 2015-11-04 DIAGNOSIS — E1122 Type 2 diabetes mellitus with diabetic chronic kidney disease: Secondary | ICD-10-CM | POA: Diagnosis not present

## 2015-11-04 DIAGNOSIS — I10 Essential (primary) hypertension: Secondary | ICD-10-CM | POA: Diagnosis not present

## 2015-11-04 DIAGNOSIS — N390 Urinary tract infection, site not specified: Secondary | ICD-10-CM | POA: Diagnosis not present

## 2015-11-04 DIAGNOSIS — N184 Chronic kidney disease, stage 4 (severe): Secondary | ICD-10-CM | POA: Diagnosis not present

## 2015-11-05 DIAGNOSIS — N184 Chronic kidney disease, stage 4 (severe): Secondary | ICD-10-CM | POA: Diagnosis not present

## 2015-11-05 DIAGNOSIS — E1122 Type 2 diabetes mellitus with diabetic chronic kidney disease: Secondary | ICD-10-CM | POA: Diagnosis not present

## 2015-11-05 DIAGNOSIS — I129 Hypertensive chronic kidney disease with stage 1 through stage 4 chronic kidney disease, or unspecified chronic kidney disease: Secondary | ICD-10-CM | POA: Diagnosis not present

## 2015-11-05 DIAGNOSIS — N179 Acute kidney failure, unspecified: Secondary | ICD-10-CM | POA: Diagnosis not present

## 2015-11-05 DIAGNOSIS — R1310 Dysphagia, unspecified: Secondary | ICD-10-CM | POA: Diagnosis not present

## 2015-11-05 DIAGNOSIS — I69393 Ataxia following cerebral infarction: Secondary | ICD-10-CM | POA: Diagnosis not present

## 2015-11-06 DIAGNOSIS — I129 Hypertensive chronic kidney disease with stage 1 through stage 4 chronic kidney disease, or unspecified chronic kidney disease: Secondary | ICD-10-CM | POA: Diagnosis not present

## 2015-11-06 DIAGNOSIS — R1310 Dysphagia, unspecified: Secondary | ICD-10-CM | POA: Diagnosis not present

## 2015-11-06 DIAGNOSIS — N179 Acute kidney failure, unspecified: Secondary | ICD-10-CM | POA: Diagnosis not present

## 2015-11-06 DIAGNOSIS — I69393 Ataxia following cerebral infarction: Secondary | ICD-10-CM | POA: Diagnosis not present

## 2015-11-06 DIAGNOSIS — N184 Chronic kidney disease, stage 4 (severe): Secondary | ICD-10-CM | POA: Diagnosis not present

## 2015-11-06 DIAGNOSIS — E1122 Type 2 diabetes mellitus with diabetic chronic kidney disease: Secondary | ICD-10-CM | POA: Diagnosis not present

## 2015-11-09 DIAGNOSIS — E1122 Type 2 diabetes mellitus with diabetic chronic kidney disease: Secondary | ICD-10-CM | POA: Diagnosis not present

## 2015-11-09 DIAGNOSIS — N179 Acute kidney failure, unspecified: Secondary | ICD-10-CM | POA: Diagnosis not present

## 2015-11-09 DIAGNOSIS — I69393 Ataxia following cerebral infarction: Secondary | ICD-10-CM | POA: Diagnosis not present

## 2015-11-09 DIAGNOSIS — I129 Hypertensive chronic kidney disease with stage 1 through stage 4 chronic kidney disease, or unspecified chronic kidney disease: Secondary | ICD-10-CM | POA: Diagnosis not present

## 2015-11-09 DIAGNOSIS — R1310 Dysphagia, unspecified: Secondary | ICD-10-CM | POA: Diagnosis not present

## 2015-11-09 DIAGNOSIS — N184 Chronic kidney disease, stage 4 (severe): Secondary | ICD-10-CM | POA: Diagnosis not present

## 2015-11-10 DIAGNOSIS — N179 Acute kidney failure, unspecified: Secondary | ICD-10-CM | POA: Diagnosis not present

## 2015-11-10 DIAGNOSIS — I69393 Ataxia following cerebral infarction: Secondary | ICD-10-CM | POA: Diagnosis not present

## 2015-11-10 DIAGNOSIS — N184 Chronic kidney disease, stage 4 (severe): Secondary | ICD-10-CM | POA: Diagnosis not present

## 2015-11-10 DIAGNOSIS — R1310 Dysphagia, unspecified: Secondary | ICD-10-CM | POA: Diagnosis not present

## 2015-11-10 DIAGNOSIS — E1122 Type 2 diabetes mellitus with diabetic chronic kidney disease: Secondary | ICD-10-CM | POA: Diagnosis not present

## 2015-11-10 DIAGNOSIS — I129 Hypertensive chronic kidney disease with stage 1 through stage 4 chronic kidney disease, or unspecified chronic kidney disease: Secondary | ICD-10-CM | POA: Diagnosis not present

## 2015-11-11 DIAGNOSIS — I69393 Ataxia following cerebral infarction: Secondary | ICD-10-CM | POA: Diagnosis not present

## 2015-11-11 DIAGNOSIS — N184 Chronic kidney disease, stage 4 (severe): Secondary | ICD-10-CM | POA: Diagnosis not present

## 2015-11-11 DIAGNOSIS — E1122 Type 2 diabetes mellitus with diabetic chronic kidney disease: Secondary | ICD-10-CM | POA: Diagnosis not present

## 2015-11-11 DIAGNOSIS — N179 Acute kidney failure, unspecified: Secondary | ICD-10-CM | POA: Diagnosis not present

## 2015-11-11 DIAGNOSIS — R1310 Dysphagia, unspecified: Secondary | ICD-10-CM | POA: Diagnosis not present

## 2015-11-11 DIAGNOSIS — I129 Hypertensive chronic kidney disease with stage 1 through stage 4 chronic kidney disease, or unspecified chronic kidney disease: Secondary | ICD-10-CM | POA: Diagnosis not present

## 2015-11-12 DIAGNOSIS — R1310 Dysphagia, unspecified: Secondary | ICD-10-CM | POA: Diagnosis not present

## 2015-11-12 DIAGNOSIS — I129 Hypertensive chronic kidney disease with stage 1 through stage 4 chronic kidney disease, or unspecified chronic kidney disease: Secondary | ICD-10-CM | POA: Diagnosis not present

## 2015-11-12 DIAGNOSIS — N179 Acute kidney failure, unspecified: Secondary | ICD-10-CM | POA: Diagnosis not present

## 2015-11-12 DIAGNOSIS — E1122 Type 2 diabetes mellitus with diabetic chronic kidney disease: Secondary | ICD-10-CM | POA: Diagnosis not present

## 2015-11-12 DIAGNOSIS — I69393 Ataxia following cerebral infarction: Secondary | ICD-10-CM | POA: Diagnosis not present

## 2015-11-12 DIAGNOSIS — N184 Chronic kidney disease, stage 4 (severe): Secondary | ICD-10-CM | POA: Diagnosis not present

## 2015-11-13 DIAGNOSIS — I69393 Ataxia following cerebral infarction: Secondary | ICD-10-CM | POA: Diagnosis not present

## 2015-11-13 DIAGNOSIS — E1122 Type 2 diabetes mellitus with diabetic chronic kidney disease: Secondary | ICD-10-CM | POA: Diagnosis not present

## 2015-11-13 DIAGNOSIS — R1310 Dysphagia, unspecified: Secondary | ICD-10-CM | POA: Diagnosis not present

## 2015-11-13 DIAGNOSIS — N184 Chronic kidney disease, stage 4 (severe): Secondary | ICD-10-CM | POA: Diagnosis not present

## 2015-11-13 DIAGNOSIS — N179 Acute kidney failure, unspecified: Secondary | ICD-10-CM | POA: Diagnosis not present

## 2015-11-13 DIAGNOSIS — I129 Hypertensive chronic kidney disease with stage 1 through stage 4 chronic kidney disease, or unspecified chronic kidney disease: Secondary | ICD-10-CM | POA: Diagnosis not present

## 2015-11-16 DIAGNOSIS — I69393 Ataxia following cerebral infarction: Secondary | ICD-10-CM | POA: Diagnosis not present

## 2015-11-16 DIAGNOSIS — N184 Chronic kidney disease, stage 4 (severe): Secondary | ICD-10-CM | POA: Diagnosis not present

## 2015-11-16 DIAGNOSIS — I129 Hypertensive chronic kidney disease with stage 1 through stage 4 chronic kidney disease, or unspecified chronic kidney disease: Secondary | ICD-10-CM | POA: Diagnosis not present

## 2015-11-16 DIAGNOSIS — E1122 Type 2 diabetes mellitus with diabetic chronic kidney disease: Secondary | ICD-10-CM | POA: Diagnosis not present

## 2015-11-16 DIAGNOSIS — N179 Acute kidney failure, unspecified: Secondary | ICD-10-CM | POA: Diagnosis not present

## 2015-11-16 DIAGNOSIS — R1310 Dysphagia, unspecified: Secondary | ICD-10-CM | POA: Diagnosis not present

## 2015-11-17 DIAGNOSIS — I129 Hypertensive chronic kidney disease with stage 1 through stage 4 chronic kidney disease, or unspecified chronic kidney disease: Secondary | ICD-10-CM | POA: Diagnosis not present

## 2015-11-17 DIAGNOSIS — N184 Chronic kidney disease, stage 4 (severe): Secondary | ICD-10-CM | POA: Diagnosis not present

## 2015-11-17 DIAGNOSIS — I69393 Ataxia following cerebral infarction: Secondary | ICD-10-CM | POA: Diagnosis not present

## 2015-11-17 DIAGNOSIS — R1310 Dysphagia, unspecified: Secondary | ICD-10-CM | POA: Diagnosis not present

## 2015-11-17 DIAGNOSIS — E1122 Type 2 diabetes mellitus with diabetic chronic kidney disease: Secondary | ICD-10-CM | POA: Diagnosis not present

## 2015-11-17 DIAGNOSIS — N179 Acute kidney failure, unspecified: Secondary | ICD-10-CM | POA: Diagnosis not present

## 2015-11-18 DIAGNOSIS — N184 Chronic kidney disease, stage 4 (severe): Secondary | ICD-10-CM | POA: Diagnosis not present

## 2015-11-18 DIAGNOSIS — I69393 Ataxia following cerebral infarction: Secondary | ICD-10-CM | POA: Diagnosis not present

## 2015-11-18 DIAGNOSIS — I129 Hypertensive chronic kidney disease with stage 1 through stage 4 chronic kidney disease, or unspecified chronic kidney disease: Secondary | ICD-10-CM | POA: Diagnosis not present

## 2015-11-18 DIAGNOSIS — N179 Acute kidney failure, unspecified: Secondary | ICD-10-CM | POA: Diagnosis not present

## 2015-11-18 DIAGNOSIS — E1122 Type 2 diabetes mellitus with diabetic chronic kidney disease: Secondary | ICD-10-CM | POA: Diagnosis not present

## 2015-11-18 DIAGNOSIS — R1310 Dysphagia, unspecified: Secondary | ICD-10-CM | POA: Diagnosis not present

## 2015-11-19 DIAGNOSIS — E1122 Type 2 diabetes mellitus with diabetic chronic kidney disease: Secondary | ICD-10-CM | POA: Diagnosis not present

## 2015-11-19 DIAGNOSIS — F39 Unspecified mood [affective] disorder: Secondary | ICD-10-CM | POA: Diagnosis not present

## 2015-11-19 DIAGNOSIS — I129 Hypertensive chronic kidney disease with stage 1 through stage 4 chronic kidney disease, or unspecified chronic kidney disease: Secondary | ICD-10-CM | POA: Diagnosis not present

## 2015-11-19 DIAGNOSIS — R1310 Dysphagia, unspecified: Secondary | ICD-10-CM | POA: Diagnosis not present

## 2015-11-19 DIAGNOSIS — F419 Anxiety disorder, unspecified: Secondary | ICD-10-CM | POA: Diagnosis not present

## 2015-11-19 DIAGNOSIS — I69393 Ataxia following cerebral infarction: Secondary | ICD-10-CM | POA: Diagnosis not present

## 2015-11-19 DIAGNOSIS — R4189 Other symptoms and signs involving cognitive functions and awareness: Secondary | ICD-10-CM | POA: Diagnosis not present

## 2015-11-19 DIAGNOSIS — N184 Chronic kidney disease, stage 4 (severe): Secondary | ICD-10-CM | POA: Diagnosis not present

## 2015-11-19 DIAGNOSIS — N179 Acute kidney failure, unspecified: Secondary | ICD-10-CM | POA: Diagnosis not present

## 2015-11-20 DIAGNOSIS — I129 Hypertensive chronic kidney disease with stage 1 through stage 4 chronic kidney disease, or unspecified chronic kidney disease: Secondary | ICD-10-CM | POA: Diagnosis not present

## 2015-11-20 DIAGNOSIS — I69393 Ataxia following cerebral infarction: Secondary | ICD-10-CM | POA: Diagnosis not present

## 2015-11-20 DIAGNOSIS — R1310 Dysphagia, unspecified: Secondary | ICD-10-CM | POA: Diagnosis not present

## 2015-11-20 DIAGNOSIS — N179 Acute kidney failure, unspecified: Secondary | ICD-10-CM | POA: Diagnosis not present

## 2015-11-20 DIAGNOSIS — N184 Chronic kidney disease, stage 4 (severe): Secondary | ICD-10-CM | POA: Diagnosis not present

## 2015-11-20 DIAGNOSIS — E1122 Type 2 diabetes mellitus with diabetic chronic kidney disease: Secondary | ICD-10-CM | POA: Diagnosis not present

## 2015-11-20 DIAGNOSIS — R509 Fever, unspecified: Secondary | ICD-10-CM | POA: Diagnosis not present

## 2015-11-21 DIAGNOSIS — N179 Acute kidney failure, unspecified: Secondary | ICD-10-CM | POA: Diagnosis not present

## 2015-11-21 DIAGNOSIS — N184 Chronic kidney disease, stage 4 (severe): Secondary | ICD-10-CM | POA: Diagnosis not present

## 2015-11-21 DIAGNOSIS — I129 Hypertensive chronic kidney disease with stage 1 through stage 4 chronic kidney disease, or unspecified chronic kidney disease: Secondary | ICD-10-CM | POA: Diagnosis not present

## 2015-11-21 DIAGNOSIS — R1310 Dysphagia, unspecified: Secondary | ICD-10-CM | POA: Diagnosis not present

## 2015-11-21 DIAGNOSIS — E1122 Type 2 diabetes mellitus with diabetic chronic kidney disease: Secondary | ICD-10-CM | POA: Diagnosis not present

## 2015-11-21 DIAGNOSIS — I69393 Ataxia following cerebral infarction: Secondary | ICD-10-CM | POA: Diagnosis not present

## 2015-11-22 DIAGNOSIS — I129 Hypertensive chronic kidney disease with stage 1 through stage 4 chronic kidney disease, or unspecified chronic kidney disease: Secondary | ICD-10-CM | POA: Diagnosis not present

## 2015-11-22 DIAGNOSIS — N179 Acute kidney failure, unspecified: Secondary | ICD-10-CM | POA: Diagnosis not present

## 2015-11-22 DIAGNOSIS — N184 Chronic kidney disease, stage 4 (severe): Secondary | ICD-10-CM | POA: Diagnosis not present

## 2015-11-22 DIAGNOSIS — I69393 Ataxia following cerebral infarction: Secondary | ICD-10-CM | POA: Diagnosis not present

## 2015-11-22 DIAGNOSIS — R1310 Dysphagia, unspecified: Secondary | ICD-10-CM | POA: Diagnosis not present

## 2015-11-22 DIAGNOSIS — E1122 Type 2 diabetes mellitus with diabetic chronic kidney disease: Secondary | ICD-10-CM | POA: Diagnosis not present

## 2015-11-23 DIAGNOSIS — I69393 Ataxia following cerebral infarction: Secondary | ICD-10-CM | POA: Diagnosis not present

## 2015-11-23 DIAGNOSIS — I6522 Occlusion and stenosis of left carotid artery: Secondary | ICD-10-CM | POA: Diagnosis not present

## 2015-11-23 DIAGNOSIS — E559 Vitamin D deficiency, unspecified: Secondary | ICD-10-CM | POA: Diagnosis not present

## 2015-11-23 DIAGNOSIS — F329 Major depressive disorder, single episode, unspecified: Secondary | ICD-10-CM | POA: Diagnosis not present

## 2015-11-23 DIAGNOSIS — N401 Enlarged prostate with lower urinary tract symptoms: Secondary | ICD-10-CM | POA: Diagnosis not present

## 2015-11-23 DIAGNOSIS — N184 Chronic kidney disease, stage 4 (severe): Secondary | ICD-10-CM | POA: Diagnosis not present

## 2015-11-23 DIAGNOSIS — I129 Hypertensive chronic kidney disease with stage 1 through stage 4 chronic kidney disease, or unspecified chronic kidney disease: Secondary | ICD-10-CM | POA: Diagnosis not present

## 2015-11-23 DIAGNOSIS — R1310 Dysphagia, unspecified: Secondary | ICD-10-CM | POA: Diagnosis not present

## 2015-11-23 DIAGNOSIS — N179 Acute kidney failure, unspecified: Secondary | ICD-10-CM | POA: Diagnosis not present

## 2015-11-23 DIAGNOSIS — E1122 Type 2 diabetes mellitus with diabetic chronic kidney disease: Secondary | ICD-10-CM | POA: Diagnosis not present

## 2015-11-23 DIAGNOSIS — E785 Hyperlipidemia, unspecified: Secondary | ICD-10-CM | POA: Diagnosis not present

## 2015-11-23 DIAGNOSIS — G20 Parkinson's disease: Secondary | ICD-10-CM | POA: Diagnosis not present

## 2015-11-26 DIAGNOSIS — I639 Cerebral infarction, unspecified: Secondary | ICD-10-CM | POA: Diagnosis not present

## 2015-11-26 DIAGNOSIS — I10 Essential (primary) hypertension: Secondary | ICD-10-CM | POA: Diagnosis not present

## 2015-11-26 DIAGNOSIS — N390 Urinary tract infection, site not specified: Secondary | ICD-10-CM | POA: Diagnosis not present

## 2015-12-10 ENCOUNTER — Emergency Department
Admission: EM | Admit: 2015-12-10 | Discharge: 2015-12-10 | Disposition: A | Discharge status: Home / Self Care | Attending: Emergency Medicine | Admitting: Emergency Medicine

## 2015-12-10 ENCOUNTER — Encounter: Payer: Self-pay | Admitting: Emergency Medicine

## 2015-12-10 ENCOUNTER — Emergency Department

## 2015-12-10 DIAGNOSIS — N184 Chronic kidney disease, stage 4 (severe): Secondary | ICD-10-CM | POA: Diagnosis not present

## 2015-12-10 DIAGNOSIS — Y92121 Bathroom in nursing home as the place of occurrence of the external cause: Secondary | ICD-10-CM | POA: Diagnosis not present

## 2015-12-10 DIAGNOSIS — Y939 Activity, unspecified: Secondary | ICD-10-CM | POA: Diagnosis not present

## 2015-12-10 DIAGNOSIS — I10 Essential (primary) hypertension: Secondary | ICD-10-CM | POA: Diagnosis not present

## 2015-12-10 DIAGNOSIS — I129 Hypertensive chronic kidney disease with stage 1 through stage 4 chronic kidney disease, or unspecified chronic kidney disease: Secondary | ICD-10-CM | POA: Insufficient documentation

## 2015-12-10 DIAGNOSIS — W1800XA Striking against unspecified object with subsequent fall, initial encounter: Secondary | ICD-10-CM | POA: Diagnosis not present

## 2015-12-10 DIAGNOSIS — W19XXXA Unspecified fall, initial encounter: Secondary | ICD-10-CM | POA: Diagnosis not present

## 2015-12-10 DIAGNOSIS — F329 Major depressive disorder, single episode, unspecified: Secondary | ICD-10-CM | POA: Diagnosis not present

## 2015-12-10 DIAGNOSIS — S01112A Laceration without foreign body of left eyelid and periocular area, initial encounter: Secondary | ICD-10-CM | POA: Insufficient documentation

## 2015-12-10 DIAGNOSIS — Y999 Unspecified external cause status: Secondary | ICD-10-CM | POA: Insufficient documentation

## 2015-12-10 DIAGNOSIS — Z79899 Other long term (current) drug therapy: Secondary | ICD-10-CM | POA: Insufficient documentation

## 2015-12-10 DIAGNOSIS — R220 Localized swelling, mass and lump, head: Secondary | ICD-10-CM | POA: Diagnosis not present

## 2015-12-10 DIAGNOSIS — Z9181 History of falling: Secondary | ICD-10-CM | POA: Diagnosis not present

## 2015-12-10 DIAGNOSIS — Z8673 Personal history of transient ischemic attack (TIA), and cerebral infarction without residual deficits: Secondary | ICD-10-CM | POA: Diagnosis not present

## 2015-12-10 DIAGNOSIS — Z8679 Personal history of other diseases of the circulatory system: Secondary | ICD-10-CM | POA: Diagnosis not present

## 2015-12-10 DIAGNOSIS — E1122 Type 2 diabetes mellitus with diabetic chronic kidney disease: Secondary | ICD-10-CM | POA: Insufficient documentation

## 2015-12-10 DIAGNOSIS — Z859 Personal history of malignant neoplasm, unspecified: Secondary | ICD-10-CM | POA: Insufficient documentation

## 2015-12-10 DIAGNOSIS — G20 Parkinson's disease: Secondary | ICD-10-CM | POA: Diagnosis not present

## 2015-12-10 DIAGNOSIS — E785 Hyperlipidemia, unspecified: Secondary | ICD-10-CM | POA: Diagnosis not present

## 2015-12-10 DIAGNOSIS — S0993XA Unspecified injury of face, initial encounter: Secondary | ICD-10-CM | POA: Diagnosis not present

## 2015-12-10 DIAGNOSIS — IMO0002 Reserved for concepts with insufficient information to code with codable children: Secondary | ICD-10-CM

## 2015-12-10 DIAGNOSIS — S199XXA Unspecified injury of neck, initial encounter: Secondary | ICD-10-CM | POA: Diagnosis not present

## 2015-12-10 NOTE — ED Provider Notes (Signed)
[facility] Emergency Department Provider Note  Time seen: 10:41 AM  I have reviewed the triage vital signs and the nursing notes.   HISTORY  Chief Complaint Fall    HPI Gary Ortega is a 82 y.o. male with a past medical history of CVA, hypertension, Parkinson's, presents to the emergency department after an unwitnessed fall at his nursing home. According to EMS report and the patient he was attempting to get up to use the restroom. The patient admits he has been told not to get up without assistance but states he had used the restroom and he fell hitting his head. Denies LOC. Denies any complaints at this time. Patient denies headache. He has a small 1 cm laceration lateral to the left eye.     Past Medical History  Diagnosis Date  . BPH (benign prostatic hypertrophy) with urinary obstruction   . CVA (cerebral infarction)   . Hypertension   . Parkinson disease ([place])   . Renal disorder   . Stroke ([place])   . Cancer [facility])     Patient Active Problem List   Diagnosis Date Noted  . Chills 10/15/2015  . Solitary kidney 09/15/2015  . S/p nephrectomy 09/15/2015  . Chronic kidney disease (CKD), stage IV (severe) ([place]) 09/15/2015  . Leukocytosis 09/15/2015  . Elevated troponin 09/15/2015  . Dysphagia 09/15/2015  . Carotid artery disease ([place]) 09/15/2015  . Acute cerebral infarction ([place])   . Ataxia 09/12/2015  . Weakness generalized 09/12/2015  . ARF (acute renal failure) ([place]) 09/12/2015  . Dehydration 09/12/2015  . ASHD  04/02/2015  . Depression, controlled 01/18/2015  . T2_NIDDM w/ CKD 4 (GFR 26 ml/min) 01/15/2015  . History of nephrectomy, unilateral 10/07/2014  . Parkinson's disease ([place]) 03/27/2014  . Medication management 11/29/2013  . BPH (benign prostatic hypertrophy) with urinary obstruction   . TIA (transient ischemic attack) 06/30/2011  . Hyperlipidemia 06/03/2010  . Vitamin D deficiency 06/02/2010  . Essential hypertension  06/02/2010    Past Surgical History  Procedure Laterality Date  . Colonoscopy    . Cystoscopy      prostate radioactive I-125 seed impklantation   . Mandible fracture surgery  1948  . Appendectomy    . Hemorrhoid surgery    . Cataract extraction    . Total nephrectomy Right 2012    Current Outpatient Rx  Name  Route  Sig  Dispense  Refill  . atorvastatin (LIPITOR) 40 MG tablet   Oral   Take 40 mg by mouth daily.         . bisacodyl (DULCOLAX) 10 MG suppository   Rectal   Place 1 suppository (10 mg total) rectally daily as needed for moderate constipation.   12 suppository   0   . carbidopa-levodopa (SINEMET IR) 25-250 MG tablet   Oral   Take 1 tablet by mouth 3 (three) times daily.   270 tablet   1   . Cholecalciferol (VITAMIN D3) 5000 units TABS   Oral   Take 5,000 Units by mouth daily.         . clopidogrel (PLAVIX) 75 MG tablet   Oral   Take 75 mg by mouth daily.         Marland Kitchen docusate sodium (COLACE) 100 MG capsule   Oral   Take 1 capsule (100 mg total) by mouth 2 (two) times daily.   10 capsule   0   . Emollient (EUCERIN EX)   Topical   Apply 1 application topically  3 (three) times daily. Pt applies to back.         . famotidine (PEPCID) 20 MG tablet   Oral   Take 1 tablet (20 mg total) by mouth at bedtime.   30 tablet   5   . feeding supplement, ENSURE ENLIVE, (ENSURE ENLIVE) LIQD   Oral   Take 237 mLs by mouth 2 (two) times daily between meals. Patient not taking: Reported on 10/02/2015   237 mL   12   . hydrocortisone butyrate (LUCOID) 0.1 % CREA cream   Topical   Apply 1 application topically daily as needed (for rash on face).         Marland Kitchen ketoconazole (NIZORAL) 2 % shampoo   Topical   Apply 1 application topically daily as needed (for seborrheic dermatitis of scalp).          . Multiple Vitamin (MULTIVITAMIN WITH MINERALS) TABS tablet   Oral   Take 1 tablet by mouth daily.         . nitroGLYCERIN (NITROSTAT) 0.4 MG SL  tablet   Sublingual   Place 0.4 mg under the tongue every 5 (five) minutes as needed for chest pain.         Marland Kitchen omega-3 acid ethyl esters (LOVAZA) 1 g capsule   Oral   Take 1 g by mouth daily.         . polyethylene glycol (MIRALAX / GLYCOLAX) packet   Oral   Take 17 g by mouth daily.   14 each   0   . vitamin C (ASCORBIC ACID) 500 MG tablet   Oral   Take 500 mg by mouth daily.           Allergies Ace inhibitors; Lisinopril; Nsaids; Tolmetin; and Wellbutrin  Family History  Problem Relation Age of Onset  . Stroke Mother   . Hypertension Mother   . Stroke Father   . Hypertension Brother     Social History Social History  Substance Use Topics  . Smoking status: Never Smoker   . Smokeless tobacco: None  . Alcohol Use: No    Review of Systems Constitutional: Negative for fever. Cardiovascular: Negative for chest pain. Respiratory: Negative for shortness of breath. Gastrointestinal: Negative for abdominal pain Musculoskeletal: Negative for back pain.Negative for neck pain.  Skin: Small laceration lateral to left eye. Neurological: Negative for headache 10-point ROS otherwise negative.  ____________________________________________   PHYSICAL EXAM:  VITAL SIGNS: ED Triage Vitals  Enc Vitals Group     BP 12/10/15 1002 121/66 mmHg     Pulse Rate 12/10/15 1002 85     Resp 12/10/15 1002 18     Temp 12/10/15 1002 98.2 F (36.8 C)     Temp Source 12/10/15 1002 Oral     SpO2 12/10/15 1002 98 %     Weight 12/10/15 1002 164 lb (74.39 kg)     Height 12/10/15 1002 5\' 9"  (1.753 m)     Head Cir --      Peak Flow --      Pain Score --      Pain Loc --      Pain Edu? --      Excl. in [place]? --     Constitutional: Alert. Well appearing and in no distress. Eyes: Normal exam, Small ones in a laceration approximately 2 cm lateral to the left eye. ENT   Head: Normocephalic and atraumatic   Mouth/Throat: Mucous membranes are moist. Cardiovascular: Normal  rate, regular rhythm.  Respiratory: Normal respiratory effort without tachypnea nor retractions. Breath sounds are clear Gastrointestinal: Soft and nontender. No distention.   Musculoskeletal: Nontender with normal range of motion in all extremities. No lower extremity tenderness or edema. Stable pelvis. Normal range of motion in upper and lower extremities without pain. Neurologic:  Normal speech and language. No gross focal neurologic deficits  Skin:  Skin is warm, dry and intact.  Psychiatric: Mood and affect are normal. Speech and behavior are normal.  ____________________________________________    EKG  EKG reviewed and interpreted by myself shows normal sinus rhythm at 80 bpm, widened QRS, left axis deviation, largely normal intervals with nonspecific but no concerning ST changes.  ____________________________________________    RADIOLOGY  CT scan showed no acute abnormality  ____________________________________________    INITIAL IMPRESSION / ASSESSMENT AND PLAN / ED COURSE  Pertinent labs & imaging results that were available during my care of the patient were reviewed by me and considered in my medical decision making (see chart for details).  The patient presents to the emergency department after a fall. Patient has a small laceration of the left eye, otherwise atraumatic exam. Good range of motion in all extremities. No neck or back tenderness. We'll obtain a CT head/C-spine, clean and repair the small laceration with Dermabond.  CT scan shows no acute abnormality. Laceration repaired with Dermabond. We'll discharge home.   LACERATION REPAIR Performed by: Harvest Dark Authorized by: Harvest Dark Consent: Verbal consent obtained. Risks and benefits: risks, benefits and alternatives were discussed Consent given by: patient Patient identity confirmed: provided demographic data Prepped and Draped in normal sterile fashion Wound explored  Laceration  Location: Left eyebrow  Laceration Length: 1 cm  No Foreign Bodies seen or palpated  Irrigation method: syringe Amount of cleaning: standard  Skin closure: Dermabond   Patient tolerance: Patient tolerated the procedure well with no immediate complications.   ____________________________________________   FINAL CLINICAL IMPRESSION(S) / ED DIAGNOSES  Fall Laceration   Harvest Dark, MD 12/10/15 1214

## 2015-12-10 NOTE — ED Notes (Signed)
Pt arrived via EMS from [name] for an unwitnessed fall. Pt presents with small laceration to left eye area. Pt denies LOC. EMS reports 118/78, 96 HR, 96% RA.

## 2015-12-10 NOTE — ED Notes (Signed)
Fall alarm clipped to patient due to high fall risk.

## 2015-12-10 NOTE — Discharge Instructions (Signed)
You have been seen in the emergency department today after a fall. You suffered a small laceration to left face, this has been repaired with Dermabond. Please keep the area dry for the next 24-48 hours. These follow-up with her primary care physician 2-3 days for recheck/reevaluation. Return to the emergency department for any headache, confusion, slurred speech, weakness or numbness of any arm or leg, or for any signs of infection such as increased redness, pus around the laceration site or fever.

## 2015-12-10 NOTE — Progress Notes (Signed)
ED visit made. Gary Ortega is currently followed by [facility] with a hospice diagnosis of Parkinson's. He is a DNR code. Hospital Care team made aware.  He was sent from [facility] following a fall in which he hit his head. Per chart note review his head and cervical spine CT's were negative.  Patient seen lying on the ED stretcher, small bruise noted near the end of his left eyebrow. Dermabond used to close the laceration. He denied pain. Plan is for discharge back to [name]. Hospice team made aware. Updated notes faxed to referral. EMS present for transport at end of visit. Thank you. Flo Shanks RN, BSN, [facility] [place], hospital Liaison [phone] c

## 2015-12-24 DIAGNOSIS — F329 Major depressive disorder, single episode, unspecified: Secondary | ICD-10-CM | POA: Diagnosis not present

## 2015-12-24 DIAGNOSIS — I129 Hypertensive chronic kidney disease with stage 1 through stage 4 chronic kidney disease, or unspecified chronic kidney disease: Secondary | ICD-10-CM | POA: Diagnosis not present

## 2015-12-24 DIAGNOSIS — E785 Hyperlipidemia, unspecified: Secondary | ICD-10-CM | POA: Diagnosis not present

## 2015-12-24 DIAGNOSIS — R1310 Dysphagia, unspecified: Secondary | ICD-10-CM | POA: Diagnosis not present

## 2015-12-24 DIAGNOSIS — N179 Acute kidney failure, unspecified: Secondary | ICD-10-CM | POA: Diagnosis not present

## 2015-12-24 DIAGNOSIS — G20 Parkinson's disease: Secondary | ICD-10-CM | POA: Diagnosis not present

## 2015-12-24 DIAGNOSIS — N184 Chronic kidney disease, stage 4 (severe): Secondary | ICD-10-CM | POA: Diagnosis not present

## 2015-12-24 DIAGNOSIS — E559 Vitamin D deficiency, unspecified: Secondary | ICD-10-CM | POA: Diagnosis not present

## 2015-12-24 DIAGNOSIS — I69393 Ataxia following cerebral infarction: Secondary | ICD-10-CM | POA: Diagnosis not present

## 2015-12-24 DIAGNOSIS — Z9181 History of falling: Secondary | ICD-10-CM | POA: Diagnosis not present

## 2015-12-24 DIAGNOSIS — I6522 Occlusion and stenosis of left carotid artery: Secondary | ICD-10-CM | POA: Diagnosis not present

## 2015-12-24 DIAGNOSIS — E1122 Type 2 diabetes mellitus with diabetic chronic kidney disease: Secondary | ICD-10-CM | POA: Diagnosis not present

## 2015-12-24 DIAGNOSIS — N401 Enlarged prostate with lower urinary tract symptoms: Secondary | ICD-10-CM | POA: Diagnosis not present

## 2016-01-07 DIAGNOSIS — I10 Essential (primary) hypertension: Secondary | ICD-10-CM | POA: Diagnosis not present

## 2016-01-07 DIAGNOSIS — L853 Xerosis cutis: Secondary | ICD-10-CM | POA: Diagnosis not present

## 2016-01-07 DIAGNOSIS — I639 Cerebral infarction, unspecified: Secondary | ICD-10-CM | POA: Diagnosis not present

## 2016-01-23 DIAGNOSIS — E1122 Type 2 diabetes mellitus with diabetic chronic kidney disease: Secondary | ICD-10-CM | POA: Diagnosis not present

## 2016-01-23 DIAGNOSIS — I6522 Occlusion and stenosis of left carotid artery: Secondary | ICD-10-CM | POA: Diagnosis not present

## 2016-01-23 DIAGNOSIS — I129 Hypertensive chronic kidney disease with stage 1 through stage 4 chronic kidney disease, or unspecified chronic kidney disease: Secondary | ICD-10-CM | POA: Diagnosis not present

## 2016-01-23 DIAGNOSIS — Z9181 History of falling: Secondary | ICD-10-CM | POA: Diagnosis not present

## 2016-01-23 DIAGNOSIS — E785 Hyperlipidemia, unspecified: Secondary | ICD-10-CM | POA: Diagnosis not present

## 2016-01-23 DIAGNOSIS — G20 Parkinson's disease: Secondary | ICD-10-CM | POA: Diagnosis not present

## 2016-01-23 DIAGNOSIS — I69393 Ataxia following cerebral infarction: Secondary | ICD-10-CM | POA: Diagnosis not present

## 2016-01-23 DIAGNOSIS — E559 Vitamin D deficiency, unspecified: Secondary | ICD-10-CM | POA: Diagnosis not present

## 2016-01-23 DIAGNOSIS — N184 Chronic kidney disease, stage 4 (severe): Secondary | ICD-10-CM | POA: Diagnosis not present

## 2016-01-23 DIAGNOSIS — R1310 Dysphagia, unspecified: Secondary | ICD-10-CM | POA: Diagnosis not present

## 2016-01-23 DIAGNOSIS — F329 Major depressive disorder, single episode, unspecified: Secondary | ICD-10-CM | POA: Diagnosis not present

## 2016-01-23 DIAGNOSIS — N401 Enlarged prostate with lower urinary tract symptoms: Secondary | ICD-10-CM | POA: Diagnosis not present

## 2016-01-23 DIAGNOSIS — N179 Acute kidney failure, unspecified: Secondary | ICD-10-CM | POA: Diagnosis not present

## 2016-01-28 DIAGNOSIS — F39 Unspecified mood [affective] disorder: Secondary | ICD-10-CM | POA: Diagnosis not present

## 2016-01-28 DIAGNOSIS — F419 Anxiety disorder, unspecified: Secondary | ICD-10-CM | POA: Diagnosis not present

## 2016-01-28 DIAGNOSIS — R4189 Other symptoms and signs involving cognitive functions and awareness: Secondary | ICD-10-CM | POA: Diagnosis not present

## 2016-02-12 DIAGNOSIS — N179 Acute kidney failure, unspecified: Secondary | ICD-10-CM | POA: Diagnosis not present

## 2016-02-12 DIAGNOSIS — I69393 Ataxia following cerebral infarction: Secondary | ICD-10-CM | POA: Diagnosis not present

## 2016-02-12 DIAGNOSIS — F419 Anxiety disorder, unspecified: Secondary | ICD-10-CM | POA: Diagnosis not present

## 2016-02-12 DIAGNOSIS — N184 Chronic kidney disease, stage 4 (severe): Secondary | ICD-10-CM | POA: Diagnosis not present

## 2016-02-12 DIAGNOSIS — I129 Hypertensive chronic kidney disease with stage 1 through stage 4 chronic kidney disease, or unspecified chronic kidney disease: Secondary | ICD-10-CM | POA: Diagnosis not present

## 2016-02-12 DIAGNOSIS — G20 Parkinson's disease: Secondary | ICD-10-CM | POA: Diagnosis not present

## 2016-02-12 DIAGNOSIS — I6350 Cerebral infarction due to unspecified occlusion or stenosis of unspecified cerebral artery: Secondary | ICD-10-CM | POA: Diagnosis not present

## 2016-02-23 DIAGNOSIS — N179 Acute kidney failure, unspecified: Secondary | ICD-10-CM | POA: Diagnosis not present

## 2016-02-23 DIAGNOSIS — E0590 Thyrotoxicosis, unspecified without thyrotoxic crisis or storm: Secondary | ICD-10-CM | POA: Diagnosis not present

## 2016-02-23 DIAGNOSIS — E785 Hyperlipidemia, unspecified: Secondary | ICD-10-CM | POA: Diagnosis not present

## 2016-02-23 DIAGNOSIS — N401 Enlarged prostate with lower urinary tract symptoms: Secondary | ICD-10-CM | POA: Diagnosis not present

## 2016-02-23 DIAGNOSIS — F329 Major depressive disorder, single episode, unspecified: Secondary | ICD-10-CM | POA: Diagnosis not present

## 2016-02-23 DIAGNOSIS — E559 Vitamin D deficiency, unspecified: Secondary | ICD-10-CM | POA: Diagnosis not present

## 2016-02-23 DIAGNOSIS — R1310 Dysphagia, unspecified: Secondary | ICD-10-CM | POA: Diagnosis not present

## 2016-02-23 DIAGNOSIS — E1122 Type 2 diabetes mellitus with diabetic chronic kidney disease: Secondary | ICD-10-CM | POA: Diagnosis not present

## 2016-02-23 DIAGNOSIS — I129 Hypertensive chronic kidney disease with stage 1 through stage 4 chronic kidney disease, or unspecified chronic kidney disease: Secondary | ICD-10-CM | POA: Diagnosis not present

## 2016-02-23 DIAGNOSIS — I69393 Ataxia following cerebral infarction: Secondary | ICD-10-CM | POA: Diagnosis not present

## 2016-02-23 DIAGNOSIS — N184 Chronic kidney disease, stage 4 (severe): Secondary | ICD-10-CM | POA: Diagnosis not present

## 2016-02-23 DIAGNOSIS — Z9181 History of falling: Secondary | ICD-10-CM | POA: Diagnosis not present

## 2016-02-23 DIAGNOSIS — G20 Parkinson's disease: Secondary | ICD-10-CM | POA: Diagnosis not present

## 2016-02-23 DIAGNOSIS — I6522 Occlusion and stenosis of left carotid artery: Secondary | ICD-10-CM | POA: Diagnosis not present

## 2016-03-03 DIAGNOSIS — F39 Unspecified mood [affective] disorder: Secondary | ICD-10-CM | POA: Diagnosis not present

## 2016-03-03 DIAGNOSIS — F0280 Dementia in other diseases classified elsewhere without behavioral disturbance: Secondary | ICD-10-CM | POA: Diagnosis not present

## 2016-03-03 DIAGNOSIS — F419 Anxiety disorder, unspecified: Secondary | ICD-10-CM | POA: Diagnosis not present

## 2016-03-14 DIAGNOSIS — G20 Parkinson's disease: Secondary | ICD-10-CM | POA: Diagnosis not present

## 2016-03-14 DIAGNOSIS — W19XXXA Unspecified fall, initial encounter: Secondary | ICD-10-CM | POA: Diagnosis not present

## 2016-03-14 DIAGNOSIS — F419 Anxiety disorder, unspecified: Secondary | ICD-10-CM | POA: Diagnosis not present

## 2016-03-14 DIAGNOSIS — N390 Urinary tract infection, site not specified: Secondary | ICD-10-CM | POA: Diagnosis not present

## 2016-03-14 DIAGNOSIS — I639 Cerebral infarction, unspecified: Secondary | ICD-10-CM | POA: Diagnosis not present

## 2016-03-14 DIAGNOSIS — N400 Enlarged prostate without lower urinary tract symptoms: Secondary | ICD-10-CM | POA: Diagnosis not present

## 2016-03-25 DIAGNOSIS — G20 Parkinson's disease: Secondary | ICD-10-CM | POA: Diagnosis not present

## 2016-03-25 DIAGNOSIS — N401 Enlarged prostate with lower urinary tract symptoms: Secondary | ICD-10-CM | POA: Diagnosis not present

## 2016-03-25 DIAGNOSIS — N179 Acute kidney failure, unspecified: Secondary | ICD-10-CM | POA: Diagnosis not present

## 2016-03-25 DIAGNOSIS — I6522 Occlusion and stenosis of left carotid artery: Secondary | ICD-10-CM | POA: Diagnosis not present

## 2016-03-25 DIAGNOSIS — E785 Hyperlipidemia, unspecified: Secondary | ICD-10-CM | POA: Diagnosis not present

## 2016-03-25 DIAGNOSIS — I129 Hypertensive chronic kidney disease with stage 1 through stage 4 chronic kidney disease, or unspecified chronic kidney disease: Secondary | ICD-10-CM | POA: Diagnosis not present

## 2016-03-25 DIAGNOSIS — Z9181 History of falling: Secondary | ICD-10-CM | POA: Diagnosis not present

## 2016-03-25 DIAGNOSIS — N184 Chronic kidney disease, stage 4 (severe): Secondary | ICD-10-CM | POA: Diagnosis not present

## 2016-03-25 DIAGNOSIS — R1310 Dysphagia, unspecified: Secondary | ICD-10-CM | POA: Diagnosis not present

## 2016-03-25 DIAGNOSIS — E559 Vitamin D deficiency, unspecified: Secondary | ICD-10-CM | POA: Diagnosis not present

## 2016-03-25 DIAGNOSIS — I69393 Ataxia following cerebral infarction: Secondary | ICD-10-CM | POA: Diagnosis not present

## 2016-03-25 DIAGNOSIS — F329 Major depressive disorder, single episode, unspecified: Secondary | ICD-10-CM | POA: Diagnosis not present

## 2016-03-25 DIAGNOSIS — E1122 Type 2 diabetes mellitus with diabetic chronic kidney disease: Secondary | ICD-10-CM | POA: Diagnosis not present

## 2016-04-04 DIAGNOSIS — N400 Enlarged prostate without lower urinary tract symptoms: Secondary | ICD-10-CM | POA: Diagnosis not present

## 2016-04-04 DIAGNOSIS — Z515 Encounter for palliative care: Secondary | ICD-10-CM | POA: Diagnosis not present

## 2016-04-04 DIAGNOSIS — F419 Anxiety disorder, unspecified: Secondary | ICD-10-CM | POA: Diagnosis not present

## 2016-04-04 DIAGNOSIS — I619 Nontraumatic intracerebral hemorrhage, unspecified: Secondary | ICD-10-CM | POA: Diagnosis not present

## 2016-04-04 DIAGNOSIS — G20 Parkinson's disease: Secondary | ICD-10-CM | POA: Diagnosis not present

## 2016-04-24 DIAGNOSIS — I129 Hypertensive chronic kidney disease with stage 1 through stage 4 chronic kidney disease, or unspecified chronic kidney disease: Secondary | ICD-10-CM | POA: Diagnosis not present

## 2016-04-24 DIAGNOSIS — E559 Vitamin D deficiency, unspecified: Secondary | ICD-10-CM | POA: Diagnosis not present

## 2016-04-24 DIAGNOSIS — E785 Hyperlipidemia, unspecified: Secondary | ICD-10-CM | POA: Diagnosis not present

## 2016-04-24 DIAGNOSIS — R1310 Dysphagia, unspecified: Secondary | ICD-10-CM | POA: Diagnosis not present

## 2016-04-24 DIAGNOSIS — E1122 Type 2 diabetes mellitus with diabetic chronic kidney disease: Secondary | ICD-10-CM | POA: Diagnosis not present

## 2016-04-24 DIAGNOSIS — I6522 Occlusion and stenosis of left carotid artery: Secondary | ICD-10-CM | POA: Diagnosis not present

## 2016-04-24 DIAGNOSIS — F329 Major depressive disorder, single episode, unspecified: Secondary | ICD-10-CM | POA: Diagnosis not present

## 2016-04-24 DIAGNOSIS — N401 Enlarged prostate with lower urinary tract symptoms: Secondary | ICD-10-CM | POA: Diagnosis not present

## 2016-04-24 DIAGNOSIS — G20 Parkinson's disease: Secondary | ICD-10-CM | POA: Diagnosis not present

## 2016-04-24 DIAGNOSIS — N184 Chronic kidney disease, stage 4 (severe): Secondary | ICD-10-CM | POA: Diagnosis not present

## 2016-04-24 DIAGNOSIS — N179 Acute kidney failure, unspecified: Secondary | ICD-10-CM | POA: Diagnosis not present

## 2016-04-24 DIAGNOSIS — I69393 Ataxia following cerebral infarction: Secondary | ICD-10-CM | POA: Diagnosis not present

## 2016-04-24 DIAGNOSIS — Z9181 History of falling: Secondary | ICD-10-CM | POA: Diagnosis not present

## 2016-04-25 ENCOUNTER — Encounter: Payer: Self-pay | Admitting: Internal Medicine

## 2016-04-28 DIAGNOSIS — N184 Chronic kidney disease, stage 4 (severe): Secondary | ICD-10-CM | POA: Diagnosis not present

## 2016-04-28 DIAGNOSIS — I6523 Occlusion and stenosis of bilateral carotid arteries: Secondary | ICD-10-CM | POA: Diagnosis not present

## 2016-04-28 DIAGNOSIS — E1122 Type 2 diabetes mellitus with diabetic chronic kidney disease: Secondary | ICD-10-CM | POA: Diagnosis not present

## 2016-04-28 DIAGNOSIS — I129 Hypertensive chronic kidney disease with stage 1 through stage 4 chronic kidney disease, or unspecified chronic kidney disease: Secondary | ICD-10-CM | POA: Diagnosis not present

## 2016-04-28 DIAGNOSIS — N179 Acute kidney failure, unspecified: Secondary | ICD-10-CM | POA: Diagnosis not present

## 2016-04-28 DIAGNOSIS — R1310 Dysphagia, unspecified: Secondary | ICD-10-CM | POA: Diagnosis not present

## 2016-04-28 DIAGNOSIS — Z9181 History of falling: Secondary | ICD-10-CM | POA: Diagnosis not present

## 2016-04-28 DIAGNOSIS — I69393 Ataxia following cerebral infarction: Secondary | ICD-10-CM | POA: Diagnosis not present

## 2016-04-28 DIAGNOSIS — I6522 Occlusion and stenosis of left carotid artery: Secondary | ICD-10-CM | POA: Diagnosis not present

## 2016-04-28 DIAGNOSIS — G20 Parkinson's disease: Secondary | ICD-10-CM | POA: Diagnosis not present

## 2016-05-03 DIAGNOSIS — Z515 Encounter for palliative care: Secondary | ICD-10-CM | POA: Diagnosis not present

## 2016-05-03 DIAGNOSIS — G20 Parkinson's disease: Secondary | ICD-10-CM | POA: Diagnosis not present

## 2016-05-03 DIAGNOSIS — N400 Enlarged prostate without lower urinary tract symptoms: Secondary | ICD-10-CM | POA: Diagnosis not present

## 2016-05-03 DIAGNOSIS — F419 Anxiety disorder, unspecified: Secondary | ICD-10-CM | POA: Diagnosis not present

## 2016-05-25 DIAGNOSIS — I69393 Ataxia following cerebral infarction: Secondary | ICD-10-CM | POA: Diagnosis not present

## 2016-05-25 DIAGNOSIS — F329 Major depressive disorder, single episode, unspecified: Secondary | ICD-10-CM | POA: Diagnosis not present

## 2016-05-25 DIAGNOSIS — E1122 Type 2 diabetes mellitus with diabetic chronic kidney disease: Secondary | ICD-10-CM | POA: Diagnosis not present

## 2016-05-25 DIAGNOSIS — N184 Chronic kidney disease, stage 4 (severe): Secondary | ICD-10-CM | POA: Diagnosis not present

## 2016-05-25 DIAGNOSIS — E559 Vitamin D deficiency, unspecified: Secondary | ICD-10-CM | POA: Diagnosis not present

## 2016-05-25 DIAGNOSIS — I6522 Occlusion and stenosis of left carotid artery: Secondary | ICD-10-CM | POA: Diagnosis not present

## 2016-05-25 DIAGNOSIS — I129 Hypertensive chronic kidney disease with stage 1 through stage 4 chronic kidney disease, or unspecified chronic kidney disease: Secondary | ICD-10-CM | POA: Diagnosis not present

## 2016-05-25 DIAGNOSIS — E785 Hyperlipidemia, unspecified: Secondary | ICD-10-CM | POA: Diagnosis not present

## 2016-05-25 DIAGNOSIS — R1310 Dysphagia, unspecified: Secondary | ICD-10-CM | POA: Diagnosis not present

## 2016-05-25 DIAGNOSIS — Z9181 History of falling: Secondary | ICD-10-CM | POA: Diagnosis not present

## 2016-05-25 DIAGNOSIS — N401 Enlarged prostate with lower urinary tract symptoms: Secondary | ICD-10-CM | POA: Diagnosis not present

## 2016-05-25 DIAGNOSIS — G20 Parkinson's disease: Secondary | ICD-10-CM | POA: Diagnosis not present

## 2016-05-25 DIAGNOSIS — N179 Acute kidney failure, unspecified: Secondary | ICD-10-CM | POA: Diagnosis not present

## 2016-06-01 DIAGNOSIS — F39 Unspecified mood [affective] disorder: Secondary | ICD-10-CM | POA: Diagnosis not present

## 2016-06-01 DIAGNOSIS — G20 Parkinson's disease: Secondary | ICD-10-CM | POA: Diagnosis not present

## 2016-06-01 DIAGNOSIS — F419 Anxiety disorder, unspecified: Secondary | ICD-10-CM | POA: Diagnosis not present

## 2016-06-01 DIAGNOSIS — F0280 Dementia in other diseases classified elsewhere without behavioral disturbance: Secondary | ICD-10-CM | POA: Diagnosis not present

## 2016-06-08 ENCOUNTER — Emergency Department

## 2016-06-08 ENCOUNTER — Encounter: Payer: Self-pay | Admitting: *Deleted

## 2016-06-08 ENCOUNTER — Emergency Department
Admission: EM | Admit: 2016-06-08 | Discharge: 2016-06-08 | Disposition: A | Discharge status: Home / Self Care | Attending: Emergency Medicine | Admitting: Emergency Medicine

## 2016-06-08 DIAGNOSIS — Y929 Unspecified place or not applicable: Secondary | ICD-10-CM | POA: Insufficient documentation

## 2016-06-08 DIAGNOSIS — Z23 Encounter for immunization: Secondary | ICD-10-CM | POA: Diagnosis not present

## 2016-06-08 DIAGNOSIS — N184 Chronic kidney disease, stage 4 (severe): Secondary | ICD-10-CM | POA: Diagnosis not present

## 2016-06-08 DIAGNOSIS — S299XXA Unspecified injury of thorax, initial encounter: Secondary | ICD-10-CM | POA: Diagnosis not present

## 2016-06-08 DIAGNOSIS — Z859 Personal history of malignant neoplasm, unspecified: Secondary | ICD-10-CM | POA: Insufficient documentation

## 2016-06-08 DIAGNOSIS — E1122 Type 2 diabetes mellitus with diabetic chronic kidney disease: Secondary | ICD-10-CM | POA: Diagnosis not present

## 2016-06-08 DIAGNOSIS — Z79899 Other long term (current) drug therapy: Secondary | ICD-10-CM | POA: Diagnosis not present

## 2016-06-08 DIAGNOSIS — Y999 Unspecified external cause status: Secondary | ICD-10-CM | POA: Insufficient documentation

## 2016-06-08 DIAGNOSIS — Z791 Long term (current) use of non-steroidal anti-inflammatories (NSAID): Secondary | ICD-10-CM | POA: Insufficient documentation

## 2016-06-08 DIAGNOSIS — W19XXXA Unspecified fall, initial encounter: Secondary | ICD-10-CM | POA: Diagnosis not present

## 2016-06-08 DIAGNOSIS — S0990XA Unspecified injury of head, initial encounter: Secondary | ICD-10-CM | POA: Diagnosis not present

## 2016-06-08 DIAGNOSIS — G20 Parkinson's disease: Secondary | ICD-10-CM | POA: Diagnosis not present

## 2016-06-08 DIAGNOSIS — R8299 Other abnormal findings in urine: Secondary | ICD-10-CM | POA: Insufficient documentation

## 2016-06-08 DIAGNOSIS — S199XXA Unspecified injury of neck, initial encounter: Secondary | ICD-10-CM | POA: Diagnosis not present

## 2016-06-08 DIAGNOSIS — I129 Hypertensive chronic kidney disease with stage 1 through stage 4 chronic kidney disease, or unspecified chronic kidney disease: Secondary | ICD-10-CM | POA: Insufficient documentation

## 2016-06-08 DIAGNOSIS — Y939 Activity, unspecified: Secondary | ICD-10-CM | POA: Insufficient documentation

## 2016-06-08 DIAGNOSIS — S0181XA Laceration without foreign body of other part of head, initial encounter: Secondary | ICD-10-CM | POA: Insufficient documentation

## 2016-06-08 DIAGNOSIS — S51012A Laceration without foreign body of left elbow, initial encounter: Secondary | ICD-10-CM | POA: Insufficient documentation

## 2016-06-08 DIAGNOSIS — S51019A Laceration without foreign body of unspecified elbow, initial encounter: Secondary | ICD-10-CM

## 2016-06-08 LAB — CBC WITH DIFFERENTIAL/PLATELET
BASOS ABS: 0.1 10*3/uL (ref 0–0.1)
BASOS PCT: 1 %
Eosinophils Absolute: 0.5 10*3/uL (ref 0–0.7)
Eosinophils Relative: 5 %
HEMATOCRIT: 41.8 % (ref 40.0–52.0)
Hemoglobin: 14.1 g/dL (ref 13.0–18.0)
Lymphocytes Relative: 13 %
Lymphs Abs: 1.2 10*3/uL (ref 1.0–3.6)
MCH: 29.8 pg (ref 26.0–34.0)
MCHC: 33.6 g/dL (ref 32.0–36.0)
MCV: 88.5 fL (ref 80.0–100.0)
MONO ABS: 0.7 10*3/uL (ref 0.2–1.0)
Monocytes Relative: 8 %
NEUTROS ABS: 6.6 10*3/uL — AB (ref 1.4–6.5)
NEUTROS PCT: 73 %
Platelets: 236 10*3/uL (ref 150–440)
RBC: 4.73 MIL/uL (ref 4.40–5.90)
RDW: 15.4 % — AB (ref 11.5–14.5)
WBC: 9.1 10*3/uL (ref 3.8–10.6)

## 2016-06-08 LAB — BASIC METABOLIC PANEL
ANION GAP: 9 (ref 5–15)
BUN: 37 mg/dL — ABNORMAL HIGH (ref 6–20)
CALCIUM: 8.6 mg/dL — AB (ref 8.9–10.3)
CO2: 24 mmol/L (ref 22–32)
Chloride: 104 mmol/L (ref 101–111)
Creatinine, Ser: 2.19 mg/dL — ABNORMAL HIGH (ref 0.61–1.24)
GFR, EST AFRICAN AMERICAN: 29 mL/min — AB (ref >60)
GFR, EST NON AFRICAN AMERICAN: 25 mL/min — AB (ref >60)
GLUCOSE: 98 mg/dL (ref 65–99)
POTASSIUM: 4.7 mmol/L (ref 3.5–5.1)
SODIUM: 137 mmol/L (ref 135–145)

## 2016-06-08 LAB — URINALYSIS COMPLETE WITH MICROSCOPIC (ARMC ONLY)
BACTERIA UA: NONE SEEN
BILIRUBIN URINE: NEGATIVE
GLUCOSE, UA: NEGATIVE mg/dL
Ketones, ur: NEGATIVE mg/dL
LEUKOCYTES UA: NEGATIVE
NITRITE: NEGATIVE
Protein, ur: 100 mg/dL — AB
SPECIFIC GRAVITY, URINE: 1.013 (ref 1.005–1.030)
pH: 6 (ref 5.0–8.0)

## 2016-06-08 MED ORDER — TETANUS-DIPHTH-ACELL PERTUSSIS 5-2.5-18.5 LF-MCG/0.5 IM SUSP
0.5000 mL | Freq: Once | INTRAMUSCULAR | Status: AC
  Administered 2016-06-08: 0.5 mL via INTRAMUSCULAR
  Filled 2016-06-08: qty 0.5

## 2016-06-08 NOTE — ED Notes (Signed)
EDP at bedside  

## 2016-06-08 NOTE — ED Provider Notes (Signed)
[facility] Emergency Department Provider Note  ____________________________________________  Time seen: Approximately 5:13 PM  I have reviewed the triage vital signs and the nursing notes.   HISTORY  Chief Complaint Fall and Laceration Level 5 caveat:  Portions of the history and physical were unable to be obtained due to the patient's poor historian   HPI Gary Ortega is a 82 y.o. male with Parkinson's disease and chronic balance issues sent to the ED due to an unwitnessed fall.Patient is followed by hospice and palliative care. CODE STATUS is DO NOT RESUSCITATE. He has a standing order to ambulate only with assistance, but frequently forgets and tries to walk on his own resulting in falls for which she's been evaluated in the ED many times in the past.  The patient denies any pain currently. He does not remember the circumstances of falling. He didn't eat lunch today but otherwise has been eating and drinking normally. Denies any acute complaints.  EMS note skin tear to the left elbow and contusion to forehead.   Past Medical History:  Diagnosis Date  . BPH (benign prostatic hypertrophy) with urinary obstruction   . Cancer ([place])   . CVA (cerebral infarction)   . Hypertension   . Parkinson disease ([place])   . Renal disorder   . Stroke [facility])      Patient Active Problem List   Diagnosis Date Noted  . Chills 10/15/2015  . Solitary kidney 09/15/2015  . S/p nephrectomy 09/15/2015  . Chronic kidney disease (CKD), stage IV (severe) ([place]) 09/15/2015  . Leukocytosis 09/15/2015  . Elevated troponin 09/15/2015  . Dysphagia 09/15/2015  . Carotid artery disease ([place]) 09/15/2015  . Acute cerebral infarction ([place])   . Ataxia 09/12/2015  . Weakness generalized 09/12/2015  . ARF (acute renal failure) ([place]) 09/12/2015  . Dehydration 09/12/2015  . ASHD  04/02/2015  . Depression, controlled 01/18/2015  . T2_NIDDM w/ CKD 4 (GFR 26 ml/min) 01/15/2015  .  History of nephrectomy, unilateral 10/07/2014  . Parkinson's disease ([place]) 03/27/2014  . Medication management 11/29/2013  . BPH (benign prostatic hypertrophy) with urinary obstruction   . TIA (transient ischemic attack) 06/30/2011  . Hyperlipidemia 06/03/2010  . Vitamin D deficiency 06/02/2010  . Essential hypertension 06/02/2010     Past Surgical History:  Procedure Laterality Date  . APPENDECTOMY    . CATARACT EXTRACTION    . COLONOSCOPY    . CYSTOSCOPY     prostate radioactive I-125 seed impklantation   . HEMORRHOID SURGERY    . [place]  . TOTAL NEPHRECTOMY Right 2012     Prior to Admission medications   Medication Sig Start Date End Date Taking? Authorizing Provider  acetaminophen (TYLENOL) 325 MG tablet Take 650 mg by mouth every 8 (eight) hours as needed for moderate pain.   Yes Historical Provider, MD  bisacodyl (DULCOLAX) 10 MG suppository Place 1 suppository (10 mg total) rectally daily as needed for moderate constipation. 09/15/15  Yes Theodoro Grist, MD  carbidopa-levodopa (SINEMET IR) 25-250 MG tablet Take 1 tablet by mouth 3 (three) times daily. 05/08/15  Yes Unk Pinto, MD  clopidogrel (PLAVIX) 75 MG tablet Take 75 mg by mouth daily.   Yes Historical Provider, MD  clorazepate (TRANXENE) 7.5 MG tablet Take 7.5 mg by mouth 2 (two) times daily.   Yes Historical Provider, MD  docusate sodium (COLACE) 100 MG capsule Take 1 capsule (100 mg total) by mouth 2 (two) times daily. 09/15/15  Yes Theodoro Grist, MD  famotidine (PEPCID) 20 MG tablet Take 1 tablet (20 mg total) by mouth at bedtime. 09/15/15  Yes Theodoro Grist, MD  feeding supplement, ENSURE ENLIVE, (ENSURE ENLIVE) LIQD Take 237 mLs by mouth 2 (two) times daily between meals. 09/15/15  Yes Theodoro Grist, MD  hydrocortisone butyrate (LUCOID) 0.1 % CREA cream Apply 1 application topically daily as needed (for rash on face).   Yes Historical Provider, MD  ipratropium-albuterol (DUONEB) 0.5-2.5  (3) MG/3ML SOLN Take 3 mLs by nebulization every 6 (six) hours as needed (wheezing/sob).   Yes Historical Provider, MD  ketoconazole (NIZORAL) 2 % shampoo Apply 1 application topically daily as needed (for seborrheic dermatitis of scalp).    Yes Historical Provider, MD  nitroGLYCERIN (NITROSTAT) 0.4 MG SL tablet Place 0.4 mg under the tongue every 5 (five) minutes as needed for chest pain.   Yes Historical Provider, MD  polyethylene glycol (MIRALAX / GLYCOLAX) packet Take 17 g by mouth daily. 09/15/15  Yes Theodoro Grist, MD  tamsulosin (FLOMAX) 0.4 MG CAPS capsule Take 0.4 mg by mouth at bedtime.   Yes Historical Provider, MD  omega-3 acid ethyl esters (LOVAZA) 1 g capsule Take 1 g by mouth daily.    Historical Provider, MD  vitamin C (ASCORBIC ACID) 500 MG tablet Take 500 mg by mouth daily.    Historical Provider, MD     Allergies Ace inhibitors; Lisinopril; Nsaids; Tolmetin; and Wellbutrin [bupropion]   Family History  Problem Relation Age of Onset  . Stroke Mother   . Hypertension Mother   . Stroke Father   . Hypertension Brother     Social History Social History  Substance Use Topics  . Smoking status: Never Smoker  . Smokeless tobacco: Not on file  . Alcohol use No    Review of Systems  Constitutional:   No fever or chills.  ENT:   No sore throat. No rhinorrhea. Cardiovascular:   No chest pain. Respiratory:   No dyspnea or cough. Gastrointestinal:   Negative for abdominal pain, vomiting and diarrhea.  Genitourinary:   Negative for dysuria or difficulty urinating. Musculoskeletal:   Negative for focal pain or swelling Neurological:   Negative for headaches 10-point ROS otherwise negative.  ____________________________________________   PHYSICAL EXAM:  VITAL SIGNS: ED Triage Vitals [06/08/16 1621]  Enc Vitals Group     BP (!) 171/88     Pulse Rate 85     Resp 18     Temp 97.6 F (36.4 C)     Temp Source Oral     SpO2 96 %     Weight 165 lb (74.8 kg)      Height 5\' 9"  (1.753 m)     Head Circumference      Peak Flow      Pain Score      Pain Loc      Pain Edu?      Excl. in [place]?     Vital signs reviewed, nursing assessments reviewed.   Constitutional:   Alert and oriented To person and place. Well appearing and in no distress. Eyes:   No scleral icterus. No conjunctival pallor. PERRL. EOMI.  No nystagmus. ENT   Head:   Normocephalic with large contusion in the center of the forehead with some bleeding but and a 1 cm laceration at the superior aspect that is not gaping. ..   Nose:   No congestion/rhinnorhea. No septal hematoma   Mouth/Throat:   MMM, no pharyngeal erythema. No peritonsillar mass.  Neck:   No stridor. No SubQ emphysema. No meningismus. Hematological/Lymphatic/Immunilogical:   No cervical lymphadenopathy. Cardiovascular:   RRR. Symmetric bilateral radial and DP pulses.  No murmurs.  Respiratory:   Normal respiratory effort without tachypnea nor retractions. Breath sounds are clear and equal bilaterally. No wheezes/rales/rhonchi. Gastrointestinal:   Soft and nontender. Non distended. There is no CVA tenderness.  No rebound, rigidity, or guarding. Genitourinary:   deferred Musculoskeletal:   Nontender with normal range of motion in all extremities. No joint effusions.  No lower extremity tenderness.  No edema. Neurologic:   Normal speech and language.  CN 2-10 normal. Tremor. Cogwheel rigidity.. No gross focal neurologic deficits are appreciated.  Skin:    Skin is warm, dry with skin tear over the lateral aspect of the left elbow without laceration. rash noted.  No petechiae, purpura, or bullae.  ____________________________________________    LABS (pertinent positives/negatives) (all labs ordered are listed, but only abnormal results are displayed) Labs Reviewed  BASIC METABOLIC PANEL - Abnormal; Notable for the following:       Result Value   BUN 37 (*)    Creatinine, Ser 2.19 (*)    Calcium 8.6 (*)     GFR calc non Af Amer 25 (*)    GFR calc Af Amer 29 (*)    All other components within normal limits  CBC WITH DIFFERENTIAL/PLATELET - Abnormal; Notable for the following:    RDW 15.4 (*)    Neutro Abs 6.6 (*)    All other components within normal limits  URINE CULTURE  URINALYSIS COMPLETEWITH MICROSCOPIC ([facility] ONLY)   ____________________________________________   EKG  Interpreted by me Sinus rhythm rate of 85, left axis, normal intervals. Normal QRS ST segments and T waves.  ____________________________________________    RADIOLOGY  CT head unremarkable CT cervical spine unremarkable Chest x-ray unremarkable  ____________________________________________   PROCEDURES Procedures LACERATION REPAIR Performed by: Joni Fears, Akasha Melena Authorized by: Carrie Mew Consent: Verbal consent obtained. Risks and benefits: risks, benefits and alternatives were discussed Consent given by: patient Patient identity confirmed: provided demographic data Prepped and Draped in normal sterile fashion Wound explored  Laceration Location: Forehead  Laceration Length: 1cm  No Foreign Bodies seen or palpated  Anesthesia: None    Irrigation method: syringe Amount of cleaning: standard  Skin closure: Steri-Strips  Technique: Manual wound alignment with topical Steri-Strips adhesive tape placement   Patient tolerance: Patient tolerated the procedure well with no immediate complications.  ____________________________________________   INITIAL IMPRESSION / ASSESSMENT AND PLAN / ED COURSE  Pertinent labs & imaging results that were available during my care of the patient were reviewed by me and considered in my medical decision making (see chart for details).  Patient presents after and was fall with skin tear left elbow and contusion to forehead. Appears to be at baseline mental status. Unable to confirm when his last Tdap was. We'll give it today. No anginal symptoms.  There is a suspect cardiopulmonary pathology at this time. No evidence of any vascular pathology. Trauma workup negative. Labs show baseline CK D. Patient be suitable for outpatient follow-up and stable for discharge home. Laceration of forehead repaired with Steri-Strips. Wound care provided for the left elbow skin tear with Xeroform and Tegaderm bandage placed over it.      Clinical Course    ____________________________________________   FINAL CLINICAL IMPRESSION(S) / ED DIAGNOSES  Final diagnoses:  Injury of head, initial encounter  Facial laceration, initial encounter  Skin tear of elbow without complication, initial  encounter       Portions of this note were generated with dragon dictation software. Dictation errors may occur despite best attempts at proofreading.    Carrie Mew, MD 06/08/16 [id number]

## 2016-06-08 NOTE — Discharge Instructions (Signed)
You were given a Tdap tetanus booster today.  Keep the steri-strips in place on the forehead for 1 week.  Avoid rubbing the area or getting the adhesive tape wet.   Keep the bandage on the Left elbow in place for 3 days.  If it comes off, it can be replaced with a dry gauze bandage.

## 2016-06-08 NOTE — ED Triage Notes (Signed)
Pt arrives via EMS from [facility] after an unwitnessed fall, arrives with laceration to forehead and skin tear to left arm, pt awake and alert, pt denies any pain or remembering falling

## 2016-06-10 LAB — URINE CULTURE: CULTURE: NO GROWTH

## 2016-06-24 DIAGNOSIS — F329 Major depressive disorder, single episode, unspecified: Secondary | ICD-10-CM | POA: Diagnosis not present

## 2016-06-24 DIAGNOSIS — Z9181 History of falling: Secondary | ICD-10-CM | POA: Diagnosis not present

## 2016-06-24 DIAGNOSIS — E785 Hyperlipidemia, unspecified: Secondary | ICD-10-CM | POA: Diagnosis not present

## 2016-06-24 DIAGNOSIS — E559 Vitamin D deficiency, unspecified: Secondary | ICD-10-CM | POA: Diagnosis not present

## 2016-06-24 DIAGNOSIS — I6522 Occlusion and stenosis of left carotid artery: Secondary | ICD-10-CM | POA: Diagnosis not present

## 2016-06-24 DIAGNOSIS — N184 Chronic kidney disease, stage 4 (severe): Secondary | ICD-10-CM | POA: Diagnosis not present

## 2016-06-24 DIAGNOSIS — G20 Parkinson's disease: Secondary | ICD-10-CM | POA: Diagnosis not present

## 2016-06-24 DIAGNOSIS — I129 Hypertensive chronic kidney disease with stage 1 through stage 4 chronic kidney disease, or unspecified chronic kidney disease: Secondary | ICD-10-CM | POA: Diagnosis not present

## 2016-06-24 DIAGNOSIS — N179 Acute kidney failure, unspecified: Secondary | ICD-10-CM | POA: Diagnosis not present

## 2016-06-24 DIAGNOSIS — R1310 Dysphagia, unspecified: Secondary | ICD-10-CM | POA: Diagnosis not present

## 2016-06-24 DIAGNOSIS — N401 Enlarged prostate with lower urinary tract symptoms: Secondary | ICD-10-CM | POA: Diagnosis not present

## 2016-06-24 DIAGNOSIS — I69393 Ataxia following cerebral infarction: Secondary | ICD-10-CM | POA: Diagnosis not present

## 2016-06-24 DIAGNOSIS — E1122 Type 2 diabetes mellitus with diabetic chronic kidney disease: Secondary | ICD-10-CM | POA: Diagnosis not present

## 2016-07-04 DIAGNOSIS — N179 Acute kidney failure, unspecified: Secondary | ICD-10-CM | POA: Diagnosis not present

## 2016-07-04 DIAGNOSIS — I69393 Ataxia following cerebral infarction: Secondary | ICD-10-CM | POA: Diagnosis not present

## 2016-07-04 DIAGNOSIS — N184 Chronic kidney disease, stage 4 (severe): Secondary | ICD-10-CM | POA: Diagnosis not present

## 2016-07-25 DIAGNOSIS — E1122 Type 2 diabetes mellitus with diabetic chronic kidney disease: Secondary | ICD-10-CM | POA: Diagnosis not present

## 2016-07-25 DIAGNOSIS — N401 Enlarged prostate with lower urinary tract symptoms: Secondary | ICD-10-CM | POA: Diagnosis not present

## 2016-07-25 DIAGNOSIS — I6522 Occlusion and stenosis of left carotid artery: Secondary | ICD-10-CM | POA: Diagnosis not present

## 2016-07-25 DIAGNOSIS — E785 Hyperlipidemia, unspecified: Secondary | ICD-10-CM | POA: Diagnosis not present

## 2016-07-25 DIAGNOSIS — R1310 Dysphagia, unspecified: Secondary | ICD-10-CM | POA: Diagnosis not present

## 2016-07-25 DIAGNOSIS — I129 Hypertensive chronic kidney disease with stage 1 through stage 4 chronic kidney disease, or unspecified chronic kidney disease: Secondary | ICD-10-CM | POA: Diagnosis not present

## 2016-07-25 DIAGNOSIS — N184 Chronic kidney disease, stage 4 (severe): Secondary | ICD-10-CM | POA: Diagnosis not present

## 2016-07-25 DIAGNOSIS — F329 Major depressive disorder, single episode, unspecified: Secondary | ICD-10-CM | POA: Diagnosis not present

## 2016-07-25 DIAGNOSIS — G20 Parkinson's disease: Secondary | ICD-10-CM | POA: Diagnosis not present

## 2016-07-25 DIAGNOSIS — I69393 Ataxia following cerebral infarction: Secondary | ICD-10-CM | POA: Diagnosis not present

## 2016-07-25 DIAGNOSIS — Z9181 History of falling: Secondary | ICD-10-CM | POA: Diagnosis not present

## 2016-07-25 DIAGNOSIS — E559 Vitamin D deficiency, unspecified: Secondary | ICD-10-CM | POA: Diagnosis not present

## 2016-07-26 DIAGNOSIS — I129 Hypertensive chronic kidney disease with stage 1 through stage 4 chronic kidney disease, or unspecified chronic kidney disease: Secondary | ICD-10-CM | POA: Diagnosis not present

## 2016-07-26 DIAGNOSIS — I6522 Occlusion and stenosis of left carotid artery: Secondary | ICD-10-CM | POA: Diagnosis not present

## 2016-07-26 DIAGNOSIS — I69393 Ataxia following cerebral infarction: Secondary | ICD-10-CM | POA: Diagnosis not present

## 2016-07-26 DIAGNOSIS — E1122 Type 2 diabetes mellitus with diabetic chronic kidney disease: Secondary | ICD-10-CM | POA: Diagnosis not present

## 2016-07-26 DIAGNOSIS — N184 Chronic kidney disease, stage 4 (severe): Secondary | ICD-10-CM | POA: Diagnosis not present

## 2016-07-26 DIAGNOSIS — R1310 Dysphagia, unspecified: Secondary | ICD-10-CM | POA: Diagnosis not present

## 2016-07-27 DIAGNOSIS — R1310 Dysphagia, unspecified: Secondary | ICD-10-CM | POA: Diagnosis not present

## 2016-07-27 DIAGNOSIS — I6522 Occlusion and stenosis of left carotid artery: Secondary | ICD-10-CM | POA: Diagnosis not present

## 2016-07-27 DIAGNOSIS — I129 Hypertensive chronic kidney disease with stage 1 through stage 4 chronic kidney disease, or unspecified chronic kidney disease: Secondary | ICD-10-CM | POA: Diagnosis not present

## 2016-07-27 DIAGNOSIS — I69393 Ataxia following cerebral infarction: Secondary | ICD-10-CM | POA: Diagnosis not present

## 2016-07-27 DIAGNOSIS — N184 Chronic kidney disease, stage 4 (severe): Secondary | ICD-10-CM | POA: Diagnosis not present

## 2016-07-27 DIAGNOSIS — E1122 Type 2 diabetes mellitus with diabetic chronic kidney disease: Secondary | ICD-10-CM | POA: Diagnosis not present

## 2016-07-28 DIAGNOSIS — I6522 Occlusion and stenosis of left carotid artery: Secondary | ICD-10-CM | POA: Diagnosis not present

## 2016-07-28 DIAGNOSIS — R1310 Dysphagia, unspecified: Secondary | ICD-10-CM | POA: Diagnosis not present

## 2016-07-28 DIAGNOSIS — I129 Hypertensive chronic kidney disease with stage 1 through stage 4 chronic kidney disease, or unspecified chronic kidney disease: Secondary | ICD-10-CM | POA: Diagnosis not present

## 2016-07-28 DIAGNOSIS — I69393 Ataxia following cerebral infarction: Secondary | ICD-10-CM | POA: Diagnosis not present

## 2016-07-28 DIAGNOSIS — E1122 Type 2 diabetes mellitus with diabetic chronic kidney disease: Secondary | ICD-10-CM | POA: Diagnosis not present

## 2016-07-28 DIAGNOSIS — N184 Chronic kidney disease, stage 4 (severe): Secondary | ICD-10-CM | POA: Diagnosis not present

## 2016-07-29 DIAGNOSIS — I129 Hypertensive chronic kidney disease with stage 1 through stage 4 chronic kidney disease, or unspecified chronic kidney disease: Secondary | ICD-10-CM | POA: Diagnosis not present

## 2016-07-29 DIAGNOSIS — N184 Chronic kidney disease, stage 4 (severe): Secondary | ICD-10-CM | POA: Diagnosis not present

## 2016-07-29 DIAGNOSIS — E1122 Type 2 diabetes mellitus with diabetic chronic kidney disease: Secondary | ICD-10-CM | POA: Diagnosis not present

## 2016-07-29 DIAGNOSIS — R1310 Dysphagia, unspecified: Secondary | ICD-10-CM | POA: Diagnosis not present

## 2016-07-29 DIAGNOSIS — I6522 Occlusion and stenosis of left carotid artery: Secondary | ICD-10-CM | POA: Diagnosis not present

## 2016-07-29 DIAGNOSIS — I69393 Ataxia following cerebral infarction: Secondary | ICD-10-CM | POA: Diagnosis not present

## 2016-08-01 DIAGNOSIS — I129 Hypertensive chronic kidney disease with stage 1 through stage 4 chronic kidney disease, or unspecified chronic kidney disease: Secondary | ICD-10-CM | POA: Diagnosis not present

## 2016-08-01 DIAGNOSIS — I69393 Ataxia following cerebral infarction: Secondary | ICD-10-CM | POA: Diagnosis not present

## 2016-08-01 DIAGNOSIS — E1122 Type 2 diabetes mellitus with diabetic chronic kidney disease: Secondary | ICD-10-CM | POA: Diagnosis not present

## 2016-08-01 DIAGNOSIS — R1310 Dysphagia, unspecified: Secondary | ICD-10-CM | POA: Diagnosis not present

## 2016-08-01 DIAGNOSIS — N184 Chronic kidney disease, stage 4 (severe): Secondary | ICD-10-CM | POA: Diagnosis not present

## 2016-08-01 DIAGNOSIS — I6522 Occlusion and stenosis of left carotid artery: Secondary | ICD-10-CM | POA: Diagnosis not present

## 2016-08-02 DIAGNOSIS — I129 Hypertensive chronic kidney disease with stage 1 through stage 4 chronic kidney disease, or unspecified chronic kidney disease: Secondary | ICD-10-CM | POA: Diagnosis not present

## 2016-08-02 DIAGNOSIS — I6522 Occlusion and stenosis of left carotid artery: Secondary | ICD-10-CM | POA: Diagnosis not present

## 2016-08-02 DIAGNOSIS — N184 Chronic kidney disease, stage 4 (severe): Secondary | ICD-10-CM | POA: Diagnosis not present

## 2016-08-02 DIAGNOSIS — I69393 Ataxia following cerebral infarction: Secondary | ICD-10-CM | POA: Diagnosis not present

## 2016-08-02 DIAGNOSIS — R1310 Dysphagia, unspecified: Secondary | ICD-10-CM | POA: Diagnosis not present

## 2016-08-02 DIAGNOSIS — E1122 Type 2 diabetes mellitus with diabetic chronic kidney disease: Secondary | ICD-10-CM | POA: Diagnosis not present

## 2016-08-03 DIAGNOSIS — I69393 Ataxia following cerebral infarction: Secondary | ICD-10-CM | POA: Diagnosis not present

## 2016-08-03 DIAGNOSIS — E1122 Type 2 diabetes mellitus with diabetic chronic kidney disease: Secondary | ICD-10-CM | POA: Diagnosis not present

## 2016-08-03 DIAGNOSIS — R1310 Dysphagia, unspecified: Secondary | ICD-10-CM | POA: Diagnosis not present

## 2016-08-03 DIAGNOSIS — I6522 Occlusion and stenosis of left carotid artery: Secondary | ICD-10-CM | POA: Diagnosis not present

## 2016-08-03 DIAGNOSIS — I129 Hypertensive chronic kidney disease with stage 1 through stage 4 chronic kidney disease, or unspecified chronic kidney disease: Secondary | ICD-10-CM | POA: Diagnosis not present

## 2016-08-03 DIAGNOSIS — N184 Chronic kidney disease, stage 4 (severe): Secondary | ICD-10-CM | POA: Diagnosis not present

## 2016-08-04 DIAGNOSIS — I6522 Occlusion and stenosis of left carotid artery: Secondary | ICD-10-CM | POA: Diagnosis not present

## 2016-08-04 DIAGNOSIS — R1310 Dysphagia, unspecified: Secondary | ICD-10-CM | POA: Diagnosis not present

## 2016-08-04 DIAGNOSIS — I129 Hypertensive chronic kidney disease with stage 1 through stage 4 chronic kidney disease, or unspecified chronic kidney disease: Secondary | ICD-10-CM | POA: Diagnosis not present

## 2016-08-04 DIAGNOSIS — I69393 Ataxia following cerebral infarction: Secondary | ICD-10-CM | POA: Diagnosis not present

## 2016-08-04 DIAGNOSIS — E1122 Type 2 diabetes mellitus with diabetic chronic kidney disease: Secondary | ICD-10-CM | POA: Diagnosis not present

## 2016-08-04 DIAGNOSIS — N184 Chronic kidney disease, stage 4 (severe): Secondary | ICD-10-CM | POA: Diagnosis not present

## 2016-08-05 DIAGNOSIS — N184 Chronic kidney disease, stage 4 (severe): Secondary | ICD-10-CM | POA: Diagnosis not present

## 2016-08-05 DIAGNOSIS — I69393 Ataxia following cerebral infarction: Secondary | ICD-10-CM | POA: Diagnosis not present

## 2016-08-05 DIAGNOSIS — I6522 Occlusion and stenosis of left carotid artery: Secondary | ICD-10-CM | POA: Diagnosis not present

## 2016-08-05 DIAGNOSIS — R1310 Dysphagia, unspecified: Secondary | ICD-10-CM | POA: Diagnosis not present

## 2016-08-05 DIAGNOSIS — I129 Hypertensive chronic kidney disease with stage 1 through stage 4 chronic kidney disease, or unspecified chronic kidney disease: Secondary | ICD-10-CM | POA: Diagnosis not present

## 2016-08-05 DIAGNOSIS — E1122 Type 2 diabetes mellitus with diabetic chronic kidney disease: Secondary | ICD-10-CM | POA: Diagnosis not present

## 2016-08-08 DIAGNOSIS — I129 Hypertensive chronic kidney disease with stage 1 through stage 4 chronic kidney disease, or unspecified chronic kidney disease: Secondary | ICD-10-CM | POA: Diagnosis not present

## 2016-08-08 DIAGNOSIS — N184 Chronic kidney disease, stage 4 (severe): Secondary | ICD-10-CM | POA: Diagnosis not present

## 2016-08-08 DIAGNOSIS — I6522 Occlusion and stenosis of left carotid artery: Secondary | ICD-10-CM | POA: Diagnosis not present

## 2016-08-08 DIAGNOSIS — E1122 Type 2 diabetes mellitus with diabetic chronic kidney disease: Secondary | ICD-10-CM | POA: Diagnosis not present

## 2016-08-08 DIAGNOSIS — I69393 Ataxia following cerebral infarction: Secondary | ICD-10-CM | POA: Diagnosis not present

## 2016-08-08 DIAGNOSIS — R1310 Dysphagia, unspecified: Secondary | ICD-10-CM | POA: Diagnosis not present

## 2016-08-09 DIAGNOSIS — N184 Chronic kidney disease, stage 4 (severe): Secondary | ICD-10-CM | POA: Diagnosis not present

## 2016-08-09 DIAGNOSIS — I6522 Occlusion and stenosis of left carotid artery: Secondary | ICD-10-CM | POA: Diagnosis not present

## 2016-08-09 DIAGNOSIS — I129 Hypertensive chronic kidney disease with stage 1 through stage 4 chronic kidney disease, or unspecified chronic kidney disease: Secondary | ICD-10-CM | POA: Diagnosis not present

## 2016-08-09 DIAGNOSIS — I69393 Ataxia following cerebral infarction: Secondary | ICD-10-CM | POA: Diagnosis not present

## 2016-08-09 DIAGNOSIS — E1122 Type 2 diabetes mellitus with diabetic chronic kidney disease: Secondary | ICD-10-CM | POA: Diagnosis not present

## 2016-08-09 DIAGNOSIS — R1310 Dysphagia, unspecified: Secondary | ICD-10-CM | POA: Diagnosis not present

## 2016-08-10 DIAGNOSIS — N184 Chronic kidney disease, stage 4 (severe): Secondary | ICD-10-CM | POA: Diagnosis not present

## 2016-08-10 DIAGNOSIS — I6522 Occlusion and stenosis of left carotid artery: Secondary | ICD-10-CM | POA: Diagnosis not present

## 2016-08-10 DIAGNOSIS — E1122 Type 2 diabetes mellitus with diabetic chronic kidney disease: Secondary | ICD-10-CM | POA: Diagnosis not present

## 2016-08-10 DIAGNOSIS — I69393 Ataxia following cerebral infarction: Secondary | ICD-10-CM | POA: Diagnosis not present

## 2016-08-10 DIAGNOSIS — I129 Hypertensive chronic kidney disease with stage 1 through stage 4 chronic kidney disease, or unspecified chronic kidney disease: Secondary | ICD-10-CM | POA: Diagnosis not present

## 2016-08-10 DIAGNOSIS — R1310 Dysphagia, unspecified: Secondary | ICD-10-CM | POA: Diagnosis not present

## 2016-08-12 DIAGNOSIS — R1310 Dysphagia, unspecified: Secondary | ICD-10-CM | POA: Diagnosis not present

## 2016-08-12 DIAGNOSIS — N184 Chronic kidney disease, stage 4 (severe): Secondary | ICD-10-CM | POA: Diagnosis not present

## 2016-08-12 DIAGNOSIS — I69393 Ataxia following cerebral infarction: Secondary | ICD-10-CM | POA: Diagnosis not present

## 2016-08-12 DIAGNOSIS — I6522 Occlusion and stenosis of left carotid artery: Secondary | ICD-10-CM | POA: Diagnosis not present

## 2016-08-12 DIAGNOSIS — E1122 Type 2 diabetes mellitus with diabetic chronic kidney disease: Secondary | ICD-10-CM | POA: Diagnosis not present

## 2016-08-12 DIAGNOSIS — I129 Hypertensive chronic kidney disease with stage 1 through stage 4 chronic kidney disease, or unspecified chronic kidney disease: Secondary | ICD-10-CM | POA: Diagnosis not present

## 2016-08-15 DIAGNOSIS — N184 Chronic kidney disease, stage 4 (severe): Secondary | ICD-10-CM | POA: Diagnosis not present

## 2016-08-15 DIAGNOSIS — E1122 Type 2 diabetes mellitus with diabetic chronic kidney disease: Secondary | ICD-10-CM | POA: Diagnosis not present

## 2016-08-15 DIAGNOSIS — R1310 Dysphagia, unspecified: Secondary | ICD-10-CM | POA: Diagnosis not present

## 2016-08-15 DIAGNOSIS — I129 Hypertensive chronic kidney disease with stage 1 through stage 4 chronic kidney disease, or unspecified chronic kidney disease: Secondary | ICD-10-CM | POA: Diagnosis not present

## 2016-08-15 DIAGNOSIS — I69393 Ataxia following cerebral infarction: Secondary | ICD-10-CM | POA: Diagnosis not present

## 2016-08-15 DIAGNOSIS — I6522 Occlusion and stenosis of left carotid artery: Secondary | ICD-10-CM | POA: Diagnosis not present

## 2016-08-16 DIAGNOSIS — F39 Unspecified mood [affective] disorder: Secondary | ICD-10-CM | POA: Diagnosis not present

## 2016-08-16 DIAGNOSIS — I129 Hypertensive chronic kidney disease with stage 1 through stage 4 chronic kidney disease, or unspecified chronic kidney disease: Secondary | ICD-10-CM | POA: Diagnosis not present

## 2016-08-16 DIAGNOSIS — N184 Chronic kidney disease, stage 4 (severe): Secondary | ICD-10-CM | POA: Diagnosis not present

## 2016-08-16 DIAGNOSIS — I69393 Ataxia following cerebral infarction: Secondary | ICD-10-CM | POA: Diagnosis not present

## 2016-08-16 DIAGNOSIS — R1310 Dysphagia, unspecified: Secondary | ICD-10-CM | POA: Diagnosis not present

## 2016-08-16 DIAGNOSIS — I6522 Occlusion and stenosis of left carotid artery: Secondary | ICD-10-CM | POA: Diagnosis not present

## 2016-08-16 DIAGNOSIS — E1122 Type 2 diabetes mellitus with diabetic chronic kidney disease: Secondary | ICD-10-CM | POA: Diagnosis not present

## 2016-08-16 DIAGNOSIS — F0280 Dementia in other diseases classified elsewhere without behavioral disturbance: Secondary | ICD-10-CM | POA: Diagnosis not present

## 2016-08-16 DIAGNOSIS — F419 Anxiety disorder, unspecified: Secondary | ICD-10-CM | POA: Diagnosis not present

## 2016-08-16 DIAGNOSIS — G20 Parkinson's disease: Secondary | ICD-10-CM | POA: Diagnosis not present

## 2016-08-17 DIAGNOSIS — I69393 Ataxia following cerebral infarction: Secondary | ICD-10-CM | POA: Diagnosis not present

## 2016-08-17 DIAGNOSIS — I129 Hypertensive chronic kidney disease with stage 1 through stage 4 chronic kidney disease, or unspecified chronic kidney disease: Secondary | ICD-10-CM | POA: Diagnosis not present

## 2016-08-17 DIAGNOSIS — R1310 Dysphagia, unspecified: Secondary | ICD-10-CM | POA: Diagnosis not present

## 2016-08-17 DIAGNOSIS — I6522 Occlusion and stenosis of left carotid artery: Secondary | ICD-10-CM | POA: Diagnosis not present

## 2016-08-17 DIAGNOSIS — E1122 Type 2 diabetes mellitus with diabetic chronic kidney disease: Secondary | ICD-10-CM | POA: Diagnosis not present

## 2016-08-17 DIAGNOSIS — N184 Chronic kidney disease, stage 4 (severe): Secondary | ICD-10-CM | POA: Diagnosis not present

## 2016-08-18 DIAGNOSIS — I69393 Ataxia following cerebral infarction: Secondary | ICD-10-CM | POA: Diagnosis not present

## 2016-08-18 DIAGNOSIS — R451 Restlessness and agitation: Secondary | ICD-10-CM | POA: Diagnosis not present

## 2016-08-18 DIAGNOSIS — I6522 Occlusion and stenosis of left carotid artery: Secondary | ICD-10-CM | POA: Diagnosis not present

## 2016-08-18 DIAGNOSIS — K219 Gastro-esophageal reflux disease without esophagitis: Secondary | ICD-10-CM | POA: Diagnosis not present

## 2016-08-18 DIAGNOSIS — I639 Cerebral infarction, unspecified: Secondary | ICD-10-CM | POA: Diagnosis not present

## 2016-08-18 DIAGNOSIS — R1310 Dysphagia, unspecified: Secondary | ICD-10-CM | POA: Diagnosis not present

## 2016-08-18 DIAGNOSIS — E1122 Type 2 diabetes mellitus with diabetic chronic kidney disease: Secondary | ICD-10-CM | POA: Diagnosis not present

## 2016-08-18 DIAGNOSIS — I129 Hypertensive chronic kidney disease with stage 1 through stage 4 chronic kidney disease, or unspecified chronic kidney disease: Secondary | ICD-10-CM | POA: Diagnosis not present

## 2016-08-18 DIAGNOSIS — N184 Chronic kidney disease, stage 4 (severe): Secondary | ICD-10-CM | POA: Diagnosis not present

## 2016-08-18 DIAGNOSIS — N400 Enlarged prostate without lower urinary tract symptoms: Secondary | ICD-10-CM | POA: Diagnosis not present

## 2016-08-19 DIAGNOSIS — I6522 Occlusion and stenosis of left carotid artery: Secondary | ICD-10-CM | POA: Diagnosis not present

## 2016-08-19 DIAGNOSIS — R1310 Dysphagia, unspecified: Secondary | ICD-10-CM | POA: Diagnosis not present

## 2016-08-19 DIAGNOSIS — I69393 Ataxia following cerebral infarction: Secondary | ICD-10-CM | POA: Diagnosis not present

## 2016-08-19 DIAGNOSIS — N184 Chronic kidney disease, stage 4 (severe): Secondary | ICD-10-CM | POA: Diagnosis not present

## 2016-08-19 DIAGNOSIS — E1122 Type 2 diabetes mellitus with diabetic chronic kidney disease: Secondary | ICD-10-CM | POA: Diagnosis not present

## 2016-08-19 DIAGNOSIS — I129 Hypertensive chronic kidney disease with stage 1 through stage 4 chronic kidney disease, or unspecified chronic kidney disease: Secondary | ICD-10-CM | POA: Diagnosis not present

## 2016-08-22 DIAGNOSIS — E1122 Type 2 diabetes mellitus with diabetic chronic kidney disease: Secondary | ICD-10-CM | POA: Diagnosis not present

## 2016-08-22 DIAGNOSIS — I6522 Occlusion and stenosis of left carotid artery: Secondary | ICD-10-CM | POA: Diagnosis not present

## 2016-08-22 DIAGNOSIS — I129 Hypertensive chronic kidney disease with stage 1 through stage 4 chronic kidney disease, or unspecified chronic kidney disease: Secondary | ICD-10-CM | POA: Diagnosis not present

## 2016-08-22 DIAGNOSIS — I69393 Ataxia following cerebral infarction: Secondary | ICD-10-CM | POA: Diagnosis not present

## 2016-08-22 DIAGNOSIS — R1310 Dysphagia, unspecified: Secondary | ICD-10-CM | POA: Diagnosis not present

## 2016-08-22 DIAGNOSIS — N184 Chronic kidney disease, stage 4 (severe): Secondary | ICD-10-CM | POA: Diagnosis not present

## 2016-08-23 DIAGNOSIS — I6522 Occlusion and stenosis of left carotid artery: Secondary | ICD-10-CM | POA: Diagnosis not present

## 2016-08-23 DIAGNOSIS — I69393 Ataxia following cerebral infarction: Secondary | ICD-10-CM | POA: Diagnosis not present

## 2016-08-23 DIAGNOSIS — R1310 Dysphagia, unspecified: Secondary | ICD-10-CM | POA: Diagnosis not present

## 2016-08-23 DIAGNOSIS — E1122 Type 2 diabetes mellitus with diabetic chronic kidney disease: Secondary | ICD-10-CM | POA: Diagnosis not present

## 2016-08-23 DIAGNOSIS — N184 Chronic kidney disease, stage 4 (severe): Secondary | ICD-10-CM | POA: Diagnosis not present

## 2016-08-23 DIAGNOSIS — I129 Hypertensive chronic kidney disease with stage 1 through stage 4 chronic kidney disease, or unspecified chronic kidney disease: Secondary | ICD-10-CM | POA: Diagnosis not present

## 2016-08-24 DIAGNOSIS — I129 Hypertensive chronic kidney disease with stage 1 through stage 4 chronic kidney disease, or unspecified chronic kidney disease: Secondary | ICD-10-CM | POA: Diagnosis not present

## 2016-08-24 DIAGNOSIS — I6522 Occlusion and stenosis of left carotid artery: Secondary | ICD-10-CM | POA: Diagnosis not present

## 2016-08-24 DIAGNOSIS — I69393 Ataxia following cerebral infarction: Secondary | ICD-10-CM | POA: Diagnosis not present

## 2016-08-24 DIAGNOSIS — N184 Chronic kidney disease, stage 4 (severe): Secondary | ICD-10-CM | POA: Diagnosis not present

## 2016-08-24 DIAGNOSIS — R1310 Dysphagia, unspecified: Secondary | ICD-10-CM | POA: Diagnosis not present

## 2016-08-24 DIAGNOSIS — E1122 Type 2 diabetes mellitus with diabetic chronic kidney disease: Secondary | ICD-10-CM | POA: Diagnosis not present

## 2016-08-25 DIAGNOSIS — E1122 Type 2 diabetes mellitus with diabetic chronic kidney disease: Secondary | ICD-10-CM | POA: Diagnosis not present

## 2016-08-25 DIAGNOSIS — I69393 Ataxia following cerebral infarction: Secondary | ICD-10-CM | POA: Diagnosis not present

## 2016-08-25 DIAGNOSIS — I6522 Occlusion and stenosis of left carotid artery: Secondary | ICD-10-CM | POA: Diagnosis not present

## 2016-08-25 DIAGNOSIS — N184 Chronic kidney disease, stage 4 (severe): Secondary | ICD-10-CM | POA: Diagnosis not present

## 2016-08-25 DIAGNOSIS — R1310 Dysphagia, unspecified: Secondary | ICD-10-CM | POA: Diagnosis not present

## 2016-08-25 DIAGNOSIS — G20 Parkinson's disease: Secondary | ICD-10-CM | POA: Diagnosis not present

## 2016-08-25 DIAGNOSIS — I129 Hypertensive chronic kidney disease with stage 1 through stage 4 chronic kidney disease, or unspecified chronic kidney disease: Secondary | ICD-10-CM | POA: Diagnosis not present

## 2016-08-25 DIAGNOSIS — E559 Vitamin D deficiency, unspecified: Secondary | ICD-10-CM | POA: Diagnosis not present

## 2016-08-25 DIAGNOSIS — Z9181 History of falling: Secondary | ICD-10-CM | POA: Diagnosis not present

## 2016-08-25 DIAGNOSIS — F329 Major depressive disorder, single episode, unspecified: Secondary | ICD-10-CM | POA: Diagnosis not present

## 2016-08-25 DIAGNOSIS — E785 Hyperlipidemia, unspecified: Secondary | ICD-10-CM | POA: Diagnosis not present

## 2016-08-25 DIAGNOSIS — N401 Enlarged prostate with lower urinary tract symptoms: Secondary | ICD-10-CM | POA: Diagnosis not present

## 2016-08-26 DIAGNOSIS — I69393 Ataxia following cerebral infarction: Secondary | ICD-10-CM | POA: Diagnosis not present

## 2016-08-26 DIAGNOSIS — I6522 Occlusion and stenosis of left carotid artery: Secondary | ICD-10-CM | POA: Diagnosis not present

## 2016-08-26 DIAGNOSIS — N184 Chronic kidney disease, stage 4 (severe): Secondary | ICD-10-CM | POA: Diagnosis not present

## 2016-08-26 DIAGNOSIS — R1310 Dysphagia, unspecified: Secondary | ICD-10-CM | POA: Diagnosis not present

## 2016-08-26 DIAGNOSIS — E1122 Type 2 diabetes mellitus with diabetic chronic kidney disease: Secondary | ICD-10-CM | POA: Diagnosis not present

## 2016-08-26 DIAGNOSIS — I129 Hypertensive chronic kidney disease with stage 1 through stage 4 chronic kidney disease, or unspecified chronic kidney disease: Secondary | ICD-10-CM | POA: Diagnosis not present

## 2016-08-29 DIAGNOSIS — I69393 Ataxia following cerebral infarction: Secondary | ICD-10-CM | POA: Diagnosis not present

## 2016-08-29 DIAGNOSIS — I129 Hypertensive chronic kidney disease with stage 1 through stage 4 chronic kidney disease, or unspecified chronic kidney disease: Secondary | ICD-10-CM | POA: Diagnosis not present

## 2016-08-29 DIAGNOSIS — R1310 Dysphagia, unspecified: Secondary | ICD-10-CM | POA: Diagnosis not present

## 2016-08-29 DIAGNOSIS — Z8673 Personal history of transient ischemic attack (TIA), and cerebral infarction without residual deficits: Secondary | ICD-10-CM | POA: Diagnosis not present

## 2016-08-29 DIAGNOSIS — I6529 Occlusion and stenosis of unspecified carotid artery: Secondary | ICD-10-CM | POA: Diagnosis not present

## 2016-08-29 DIAGNOSIS — G20 Parkinson's disease: Secondary | ICD-10-CM | POA: Diagnosis not present

## 2016-08-29 DIAGNOSIS — E1122 Type 2 diabetes mellitus with diabetic chronic kidney disease: Secondary | ICD-10-CM | POA: Diagnosis not present

## 2016-08-29 DIAGNOSIS — I6522 Occlusion and stenosis of left carotid artery: Secondary | ICD-10-CM | POA: Diagnosis not present

## 2016-08-29 DIAGNOSIS — N184 Chronic kidney disease, stage 4 (severe): Secondary | ICD-10-CM | POA: Diagnosis not present

## 2016-08-30 DIAGNOSIS — I69393 Ataxia following cerebral infarction: Secondary | ICD-10-CM | POA: Diagnosis not present

## 2016-08-30 DIAGNOSIS — N184 Chronic kidney disease, stage 4 (severe): Secondary | ICD-10-CM | POA: Diagnosis not present

## 2016-08-30 DIAGNOSIS — R1310 Dysphagia, unspecified: Secondary | ICD-10-CM | POA: Diagnosis not present

## 2016-08-30 DIAGNOSIS — I129 Hypertensive chronic kidney disease with stage 1 through stage 4 chronic kidney disease, or unspecified chronic kidney disease: Secondary | ICD-10-CM | POA: Diagnosis not present

## 2016-08-30 DIAGNOSIS — I6522 Occlusion and stenosis of left carotid artery: Secondary | ICD-10-CM | POA: Diagnosis not present

## 2016-08-30 DIAGNOSIS — E1122 Type 2 diabetes mellitus with diabetic chronic kidney disease: Secondary | ICD-10-CM | POA: Diagnosis not present

## 2016-08-31 DIAGNOSIS — I69393 Ataxia following cerebral infarction: Secondary | ICD-10-CM | POA: Diagnosis not present

## 2016-08-31 DIAGNOSIS — I6522 Occlusion and stenosis of left carotid artery: Secondary | ICD-10-CM | POA: Diagnosis not present

## 2016-08-31 DIAGNOSIS — I129 Hypertensive chronic kidney disease with stage 1 through stage 4 chronic kidney disease, or unspecified chronic kidney disease: Secondary | ICD-10-CM | POA: Diagnosis not present

## 2016-08-31 DIAGNOSIS — N184 Chronic kidney disease, stage 4 (severe): Secondary | ICD-10-CM | POA: Diagnosis not present

## 2016-08-31 DIAGNOSIS — E1122 Type 2 diabetes mellitus with diabetic chronic kidney disease: Secondary | ICD-10-CM | POA: Diagnosis not present

## 2016-08-31 DIAGNOSIS — R1310 Dysphagia, unspecified: Secondary | ICD-10-CM | POA: Diagnosis not present

## 2016-09-01 DIAGNOSIS — N184 Chronic kidney disease, stage 4 (severe): Secondary | ICD-10-CM | POA: Diagnosis not present

## 2016-09-01 DIAGNOSIS — I129 Hypertensive chronic kidney disease with stage 1 through stage 4 chronic kidney disease, or unspecified chronic kidney disease: Secondary | ICD-10-CM | POA: Diagnosis not present

## 2016-09-01 DIAGNOSIS — E1122 Type 2 diabetes mellitus with diabetic chronic kidney disease: Secondary | ICD-10-CM | POA: Diagnosis not present

## 2016-09-01 DIAGNOSIS — I69393 Ataxia following cerebral infarction: Secondary | ICD-10-CM | POA: Diagnosis not present

## 2016-09-01 DIAGNOSIS — I6522 Occlusion and stenosis of left carotid artery: Secondary | ICD-10-CM | POA: Diagnosis not present

## 2016-09-01 DIAGNOSIS — R1310 Dysphagia, unspecified: Secondary | ICD-10-CM | POA: Diagnosis not present

## 2016-09-02 DIAGNOSIS — N184 Chronic kidney disease, stage 4 (severe): Secondary | ICD-10-CM | POA: Diagnosis not present

## 2016-09-02 DIAGNOSIS — I129 Hypertensive chronic kidney disease with stage 1 through stage 4 chronic kidney disease, or unspecified chronic kidney disease: Secondary | ICD-10-CM | POA: Diagnosis not present

## 2016-09-02 DIAGNOSIS — I69393 Ataxia following cerebral infarction: Secondary | ICD-10-CM | POA: Diagnosis not present

## 2016-09-02 DIAGNOSIS — I6522 Occlusion and stenosis of left carotid artery: Secondary | ICD-10-CM | POA: Diagnosis not present

## 2016-09-02 DIAGNOSIS — R1310 Dysphagia, unspecified: Secondary | ICD-10-CM | POA: Diagnosis not present

## 2016-09-02 DIAGNOSIS — E1122 Type 2 diabetes mellitus with diabetic chronic kidney disease: Secondary | ICD-10-CM | POA: Diagnosis not present

## 2016-09-05 DIAGNOSIS — E1122 Type 2 diabetes mellitus with diabetic chronic kidney disease: Secondary | ICD-10-CM | POA: Diagnosis not present

## 2016-09-05 DIAGNOSIS — I129 Hypertensive chronic kidney disease with stage 1 through stage 4 chronic kidney disease, or unspecified chronic kidney disease: Secondary | ICD-10-CM | POA: Diagnosis not present

## 2016-09-05 DIAGNOSIS — I69393 Ataxia following cerebral infarction: Secondary | ICD-10-CM | POA: Diagnosis not present

## 2016-09-05 DIAGNOSIS — R1310 Dysphagia, unspecified: Secondary | ICD-10-CM | POA: Diagnosis not present

## 2016-09-05 DIAGNOSIS — I6522 Occlusion and stenosis of left carotid artery: Secondary | ICD-10-CM | POA: Diagnosis not present

## 2016-09-05 DIAGNOSIS — N184 Chronic kidney disease, stage 4 (severe): Secondary | ICD-10-CM | POA: Diagnosis not present

## 2016-09-06 DIAGNOSIS — E1122 Type 2 diabetes mellitus with diabetic chronic kidney disease: Secondary | ICD-10-CM | POA: Diagnosis not present

## 2016-09-06 DIAGNOSIS — R1310 Dysphagia, unspecified: Secondary | ICD-10-CM | POA: Diagnosis not present

## 2016-09-06 DIAGNOSIS — I6522 Occlusion and stenosis of left carotid artery: Secondary | ICD-10-CM | POA: Diagnosis not present

## 2016-09-06 DIAGNOSIS — I129 Hypertensive chronic kidney disease with stage 1 through stage 4 chronic kidney disease, or unspecified chronic kidney disease: Secondary | ICD-10-CM | POA: Diagnosis not present

## 2016-09-06 DIAGNOSIS — N184 Chronic kidney disease, stage 4 (severe): Secondary | ICD-10-CM | POA: Diagnosis not present

## 2016-09-06 DIAGNOSIS — I69393 Ataxia following cerebral infarction: Secondary | ICD-10-CM | POA: Diagnosis not present

## 2016-09-07 DIAGNOSIS — I69393 Ataxia following cerebral infarction: Secondary | ICD-10-CM | POA: Diagnosis not present

## 2016-09-07 DIAGNOSIS — N184 Chronic kidney disease, stage 4 (severe): Secondary | ICD-10-CM | POA: Diagnosis not present

## 2016-09-07 DIAGNOSIS — E1122 Type 2 diabetes mellitus with diabetic chronic kidney disease: Secondary | ICD-10-CM | POA: Diagnosis not present

## 2016-09-07 DIAGNOSIS — I129 Hypertensive chronic kidney disease with stage 1 through stage 4 chronic kidney disease, or unspecified chronic kidney disease: Secondary | ICD-10-CM | POA: Diagnosis not present

## 2016-09-07 DIAGNOSIS — I6522 Occlusion and stenosis of left carotid artery: Secondary | ICD-10-CM | POA: Diagnosis not present

## 2016-09-07 DIAGNOSIS — R1310 Dysphagia, unspecified: Secondary | ICD-10-CM | POA: Diagnosis not present

## 2016-09-08 DIAGNOSIS — I129 Hypertensive chronic kidney disease with stage 1 through stage 4 chronic kidney disease, or unspecified chronic kidney disease: Secondary | ICD-10-CM | POA: Diagnosis not present

## 2016-09-08 DIAGNOSIS — E1122 Type 2 diabetes mellitus with diabetic chronic kidney disease: Secondary | ICD-10-CM | POA: Diagnosis not present

## 2016-09-08 DIAGNOSIS — I6522 Occlusion and stenosis of left carotid artery: Secondary | ICD-10-CM | POA: Diagnosis not present

## 2016-09-08 DIAGNOSIS — R1310 Dysphagia, unspecified: Secondary | ICD-10-CM | POA: Diagnosis not present

## 2016-09-08 DIAGNOSIS — N184 Chronic kidney disease, stage 4 (severe): Secondary | ICD-10-CM | POA: Diagnosis not present

## 2016-09-08 DIAGNOSIS — I69393 Ataxia following cerebral infarction: Secondary | ICD-10-CM | POA: Diagnosis not present

## 2016-09-09 DIAGNOSIS — N184 Chronic kidney disease, stage 4 (severe): Secondary | ICD-10-CM | POA: Diagnosis not present

## 2016-09-09 DIAGNOSIS — I129 Hypertensive chronic kidney disease with stage 1 through stage 4 chronic kidney disease, or unspecified chronic kidney disease: Secondary | ICD-10-CM | POA: Diagnosis not present

## 2016-09-09 DIAGNOSIS — R1310 Dysphagia, unspecified: Secondary | ICD-10-CM | POA: Diagnosis not present

## 2016-09-09 DIAGNOSIS — I69393 Ataxia following cerebral infarction: Secondary | ICD-10-CM | POA: Diagnosis not present

## 2016-09-09 DIAGNOSIS — I6522 Occlusion and stenosis of left carotid artery: Secondary | ICD-10-CM | POA: Diagnosis not present

## 2016-09-09 DIAGNOSIS — E1122 Type 2 diabetes mellitus with diabetic chronic kidney disease: Secondary | ICD-10-CM | POA: Diagnosis not present

## 2016-09-12 DIAGNOSIS — N184 Chronic kidney disease, stage 4 (severe): Secondary | ICD-10-CM | POA: Diagnosis not present

## 2016-09-12 DIAGNOSIS — I69393 Ataxia following cerebral infarction: Secondary | ICD-10-CM | POA: Diagnosis not present

## 2016-09-12 DIAGNOSIS — I129 Hypertensive chronic kidney disease with stage 1 through stage 4 chronic kidney disease, or unspecified chronic kidney disease: Secondary | ICD-10-CM | POA: Diagnosis not present

## 2016-09-12 DIAGNOSIS — E1122 Type 2 diabetes mellitus with diabetic chronic kidney disease: Secondary | ICD-10-CM | POA: Diagnosis not present

## 2016-09-12 DIAGNOSIS — I6522 Occlusion and stenosis of left carotid artery: Secondary | ICD-10-CM | POA: Diagnosis not present

## 2016-09-12 DIAGNOSIS — R1310 Dysphagia, unspecified: Secondary | ICD-10-CM | POA: Diagnosis not present

## 2016-09-13 DIAGNOSIS — I69393 Ataxia following cerebral infarction: Secondary | ICD-10-CM | POA: Diagnosis not present

## 2016-09-13 DIAGNOSIS — N184 Chronic kidney disease, stage 4 (severe): Secondary | ICD-10-CM | POA: Diagnosis not present

## 2016-09-13 DIAGNOSIS — I6522 Occlusion and stenosis of left carotid artery: Secondary | ICD-10-CM | POA: Diagnosis not present

## 2016-09-13 DIAGNOSIS — E1122 Type 2 diabetes mellitus with diabetic chronic kidney disease: Secondary | ICD-10-CM | POA: Diagnosis not present

## 2016-09-13 DIAGNOSIS — R1310 Dysphagia, unspecified: Secondary | ICD-10-CM | POA: Diagnosis not present

## 2016-09-13 DIAGNOSIS — I129 Hypertensive chronic kidney disease with stage 1 through stage 4 chronic kidney disease, or unspecified chronic kidney disease: Secondary | ICD-10-CM | POA: Diagnosis not present

## 2016-09-14 DIAGNOSIS — N184 Chronic kidney disease, stage 4 (severe): Secondary | ICD-10-CM | POA: Diagnosis not present

## 2016-09-14 DIAGNOSIS — I6522 Occlusion and stenosis of left carotid artery: Secondary | ICD-10-CM | POA: Diagnosis not present

## 2016-09-14 DIAGNOSIS — I69393 Ataxia following cerebral infarction: Secondary | ICD-10-CM | POA: Diagnosis not present

## 2016-09-14 DIAGNOSIS — R1310 Dysphagia, unspecified: Secondary | ICD-10-CM | POA: Diagnosis not present

## 2016-09-14 DIAGNOSIS — I129 Hypertensive chronic kidney disease with stage 1 through stage 4 chronic kidney disease, or unspecified chronic kidney disease: Secondary | ICD-10-CM | POA: Diagnosis not present

## 2016-09-14 DIAGNOSIS — E1122 Type 2 diabetes mellitus with diabetic chronic kidney disease: Secondary | ICD-10-CM | POA: Diagnosis not present

## 2016-09-15 DIAGNOSIS — I6522 Occlusion and stenosis of left carotid artery: Secondary | ICD-10-CM | POA: Diagnosis not present

## 2016-09-15 DIAGNOSIS — E1122 Type 2 diabetes mellitus with diabetic chronic kidney disease: Secondary | ICD-10-CM | POA: Diagnosis not present

## 2016-09-15 DIAGNOSIS — I129 Hypertensive chronic kidney disease with stage 1 through stage 4 chronic kidney disease, or unspecified chronic kidney disease: Secondary | ICD-10-CM | POA: Diagnosis not present

## 2016-09-15 DIAGNOSIS — R1310 Dysphagia, unspecified: Secondary | ICD-10-CM | POA: Diagnosis not present

## 2016-09-15 DIAGNOSIS — N184 Chronic kidney disease, stage 4 (severe): Secondary | ICD-10-CM | POA: Diagnosis not present

## 2016-09-15 DIAGNOSIS — I69393 Ataxia following cerebral infarction: Secondary | ICD-10-CM | POA: Diagnosis not present

## 2016-09-16 DIAGNOSIS — I6522 Occlusion and stenosis of left carotid artery: Secondary | ICD-10-CM | POA: Diagnosis not present

## 2016-09-16 DIAGNOSIS — R1310 Dysphagia, unspecified: Secondary | ICD-10-CM | POA: Diagnosis not present

## 2016-09-16 DIAGNOSIS — E1122 Type 2 diabetes mellitus with diabetic chronic kidney disease: Secondary | ICD-10-CM | POA: Diagnosis not present

## 2016-09-16 DIAGNOSIS — I129 Hypertensive chronic kidney disease with stage 1 through stage 4 chronic kidney disease, or unspecified chronic kidney disease: Secondary | ICD-10-CM | POA: Diagnosis not present

## 2016-09-16 DIAGNOSIS — I69393 Ataxia following cerebral infarction: Secondary | ICD-10-CM | POA: Diagnosis not present

## 2016-09-16 DIAGNOSIS — N184 Chronic kidney disease, stage 4 (severe): Secondary | ICD-10-CM | POA: Diagnosis not present

## 2016-09-19 DIAGNOSIS — N184 Chronic kidney disease, stage 4 (severe): Secondary | ICD-10-CM | POA: Diagnosis not present

## 2016-09-19 DIAGNOSIS — I129 Hypertensive chronic kidney disease with stage 1 through stage 4 chronic kidney disease, or unspecified chronic kidney disease: Secondary | ICD-10-CM | POA: Diagnosis not present

## 2016-09-19 DIAGNOSIS — I69393 Ataxia following cerebral infarction: Secondary | ICD-10-CM | POA: Diagnosis not present

## 2016-09-19 DIAGNOSIS — I6522 Occlusion and stenosis of left carotid artery: Secondary | ICD-10-CM | POA: Diagnosis not present

## 2016-09-19 DIAGNOSIS — E1122 Type 2 diabetes mellitus with diabetic chronic kidney disease: Secondary | ICD-10-CM | POA: Diagnosis not present

## 2016-09-19 DIAGNOSIS — R1310 Dysphagia, unspecified: Secondary | ICD-10-CM | POA: Diagnosis not present

## 2016-09-20 DIAGNOSIS — Z79899 Other long term (current) drug therapy: Secondary | ICD-10-CM | POA: Diagnosis not present

## 2016-09-20 DIAGNOSIS — R1310 Dysphagia, unspecified: Secondary | ICD-10-CM | POA: Diagnosis not present

## 2016-09-20 DIAGNOSIS — I69393 Ataxia following cerebral infarction: Secondary | ICD-10-CM | POA: Diagnosis not present

## 2016-09-20 DIAGNOSIS — E1122 Type 2 diabetes mellitus with diabetic chronic kidney disease: Secondary | ICD-10-CM | POA: Diagnosis not present

## 2016-09-20 DIAGNOSIS — I6522 Occlusion and stenosis of left carotid artery: Secondary | ICD-10-CM | POA: Diagnosis not present

## 2016-09-20 DIAGNOSIS — N184 Chronic kidney disease, stage 4 (severe): Secondary | ICD-10-CM | POA: Diagnosis not present

## 2016-09-20 DIAGNOSIS — I129 Hypertensive chronic kidney disease with stage 1 through stage 4 chronic kidney disease, or unspecified chronic kidney disease: Secondary | ICD-10-CM | POA: Diagnosis not present

## 2016-09-21 DIAGNOSIS — N184 Chronic kidney disease, stage 4 (severe): Secondary | ICD-10-CM | POA: Diagnosis not present

## 2016-09-21 DIAGNOSIS — E1122 Type 2 diabetes mellitus with diabetic chronic kidney disease: Secondary | ICD-10-CM | POA: Diagnosis not present

## 2016-09-21 DIAGNOSIS — I129 Hypertensive chronic kidney disease with stage 1 through stage 4 chronic kidney disease, or unspecified chronic kidney disease: Secondary | ICD-10-CM | POA: Diagnosis not present

## 2016-09-21 DIAGNOSIS — R1310 Dysphagia, unspecified: Secondary | ICD-10-CM | POA: Diagnosis not present

## 2016-09-21 DIAGNOSIS — I6522 Occlusion and stenosis of left carotid artery: Secondary | ICD-10-CM | POA: Diagnosis not present

## 2016-09-21 DIAGNOSIS — I69393 Ataxia following cerebral infarction: Secondary | ICD-10-CM | POA: Diagnosis not present

## 2016-09-22 DIAGNOSIS — I69393 Ataxia following cerebral infarction: Secondary | ICD-10-CM | POA: Diagnosis not present

## 2016-09-22 DIAGNOSIS — E1122 Type 2 diabetes mellitus with diabetic chronic kidney disease: Secondary | ICD-10-CM | POA: Diagnosis not present

## 2016-09-22 DIAGNOSIS — F329 Major depressive disorder, single episode, unspecified: Secondary | ICD-10-CM | POA: Diagnosis not present

## 2016-09-22 DIAGNOSIS — Z961 Presence of intraocular lens: Secondary | ICD-10-CM | POA: Diagnosis not present

## 2016-09-22 DIAGNOSIS — Z9181 History of falling: Secondary | ICD-10-CM | POA: Diagnosis not present

## 2016-09-22 DIAGNOSIS — G20 Parkinson's disease: Secondary | ICD-10-CM | POA: Diagnosis not present

## 2016-09-22 DIAGNOSIS — E559 Vitamin D deficiency, unspecified: Secondary | ICD-10-CM | POA: Diagnosis not present

## 2016-09-22 DIAGNOSIS — E785 Hyperlipidemia, unspecified: Secondary | ICD-10-CM | POA: Diagnosis not present

## 2016-09-22 DIAGNOSIS — E119 Type 2 diabetes mellitus without complications: Secondary | ICD-10-CM | POA: Diagnosis not present

## 2016-09-22 DIAGNOSIS — I6522 Occlusion and stenosis of left carotid artery: Secondary | ICD-10-CM | POA: Diagnosis not present

## 2016-09-22 DIAGNOSIS — I129 Hypertensive chronic kidney disease with stage 1 through stage 4 chronic kidney disease, or unspecified chronic kidney disease: Secondary | ICD-10-CM | POA: Diagnosis not present

## 2016-09-22 DIAGNOSIS — N400 Enlarged prostate without lower urinary tract symptoms: Secondary | ICD-10-CM | POA: Diagnosis not present

## 2016-09-22 DIAGNOSIS — R1310 Dysphagia, unspecified: Secondary | ICD-10-CM | POA: Diagnosis not present

## 2016-09-22 DIAGNOSIS — N184 Chronic kidney disease, stage 4 (severe): Secondary | ICD-10-CM | POA: Diagnosis not present

## 2016-09-23 DIAGNOSIS — N184 Chronic kidney disease, stage 4 (severe): Secondary | ICD-10-CM | POA: Diagnosis not present

## 2016-09-23 DIAGNOSIS — I6522 Occlusion and stenosis of left carotid artery: Secondary | ICD-10-CM | POA: Diagnosis not present

## 2016-09-23 DIAGNOSIS — E1122 Type 2 diabetes mellitus with diabetic chronic kidney disease: Secondary | ICD-10-CM | POA: Diagnosis not present

## 2016-09-23 DIAGNOSIS — R1310 Dysphagia, unspecified: Secondary | ICD-10-CM | POA: Diagnosis not present

## 2016-09-23 DIAGNOSIS — I129 Hypertensive chronic kidney disease with stage 1 through stage 4 chronic kidney disease, or unspecified chronic kidney disease: Secondary | ICD-10-CM | POA: Diagnosis not present

## 2016-09-23 DIAGNOSIS — I69393 Ataxia following cerebral infarction: Secondary | ICD-10-CM | POA: Diagnosis not present

## 2016-09-26 DIAGNOSIS — E1122 Type 2 diabetes mellitus with diabetic chronic kidney disease: Secondary | ICD-10-CM | POA: Diagnosis not present

## 2016-09-26 DIAGNOSIS — N184 Chronic kidney disease, stage 4 (severe): Secondary | ICD-10-CM | POA: Diagnosis not present

## 2016-09-26 DIAGNOSIS — R1310 Dysphagia, unspecified: Secondary | ICD-10-CM | POA: Diagnosis not present

## 2016-09-26 DIAGNOSIS — I6522 Occlusion and stenosis of left carotid artery: Secondary | ICD-10-CM | POA: Diagnosis not present

## 2016-09-26 DIAGNOSIS — I69393 Ataxia following cerebral infarction: Secondary | ICD-10-CM | POA: Diagnosis not present

## 2016-09-26 DIAGNOSIS — I129 Hypertensive chronic kidney disease with stage 1 through stage 4 chronic kidney disease, or unspecified chronic kidney disease: Secondary | ICD-10-CM | POA: Diagnosis not present

## 2016-09-27 DIAGNOSIS — I6522 Occlusion and stenosis of left carotid artery: Secondary | ICD-10-CM | POA: Diagnosis not present

## 2016-09-27 DIAGNOSIS — N184 Chronic kidney disease, stage 4 (severe): Secondary | ICD-10-CM | POA: Diagnosis not present

## 2016-09-27 DIAGNOSIS — R1310 Dysphagia, unspecified: Secondary | ICD-10-CM | POA: Diagnosis not present

## 2016-09-27 DIAGNOSIS — I129 Hypertensive chronic kidney disease with stage 1 through stage 4 chronic kidney disease, or unspecified chronic kidney disease: Secondary | ICD-10-CM | POA: Diagnosis not present

## 2016-09-27 DIAGNOSIS — E1122 Type 2 diabetes mellitus with diabetic chronic kidney disease: Secondary | ICD-10-CM | POA: Diagnosis not present

## 2016-09-27 DIAGNOSIS — I69393 Ataxia following cerebral infarction: Secondary | ICD-10-CM | POA: Diagnosis not present

## 2016-09-28 DIAGNOSIS — I129 Hypertensive chronic kidney disease with stage 1 through stage 4 chronic kidney disease, or unspecified chronic kidney disease: Secondary | ICD-10-CM | POA: Diagnosis not present

## 2016-09-28 DIAGNOSIS — I69393 Ataxia following cerebral infarction: Secondary | ICD-10-CM | POA: Diagnosis not present

## 2016-09-28 DIAGNOSIS — E1122 Type 2 diabetes mellitus with diabetic chronic kidney disease: Secondary | ICD-10-CM | POA: Diagnosis not present

## 2016-09-28 DIAGNOSIS — R1310 Dysphagia, unspecified: Secondary | ICD-10-CM | POA: Diagnosis not present

## 2016-09-28 DIAGNOSIS — N184 Chronic kidney disease, stage 4 (severe): Secondary | ICD-10-CM | POA: Diagnosis not present

## 2016-09-28 DIAGNOSIS — I6522 Occlusion and stenosis of left carotid artery: Secondary | ICD-10-CM | POA: Diagnosis not present

## 2016-09-29 DIAGNOSIS — I129 Hypertensive chronic kidney disease with stage 1 through stage 4 chronic kidney disease, or unspecified chronic kidney disease: Secondary | ICD-10-CM | POA: Diagnosis not present

## 2016-09-29 DIAGNOSIS — I69393 Ataxia following cerebral infarction: Secondary | ICD-10-CM | POA: Diagnosis not present

## 2016-09-29 DIAGNOSIS — R062 Wheezing: Secondary | ICD-10-CM | POA: Diagnosis not present

## 2016-09-29 DIAGNOSIS — R05 Cough: Secondary | ICD-10-CM | POA: Diagnosis not present

## 2016-09-29 DIAGNOSIS — N184 Chronic kidney disease, stage 4 (severe): Secondary | ICD-10-CM | POA: Diagnosis not present

## 2016-09-29 DIAGNOSIS — R1310 Dysphagia, unspecified: Secondary | ICD-10-CM | POA: Diagnosis not present

## 2016-09-29 DIAGNOSIS — E1122 Type 2 diabetes mellitus with diabetic chronic kidney disease: Secondary | ICD-10-CM | POA: Diagnosis not present

## 2016-09-29 DIAGNOSIS — R0989 Other specified symptoms and signs involving the circulatory and respiratory systems: Secondary | ICD-10-CM | POA: Diagnosis not present

## 2016-09-29 DIAGNOSIS — I6522 Occlusion and stenosis of left carotid artery: Secondary | ICD-10-CM | POA: Diagnosis not present

## 2016-09-30 DIAGNOSIS — I129 Hypertensive chronic kidney disease with stage 1 through stage 4 chronic kidney disease, or unspecified chronic kidney disease: Secondary | ICD-10-CM | POA: Diagnosis not present

## 2016-09-30 DIAGNOSIS — I69393 Ataxia following cerebral infarction: Secondary | ICD-10-CM | POA: Diagnosis not present

## 2016-09-30 DIAGNOSIS — R1310 Dysphagia, unspecified: Secondary | ICD-10-CM | POA: Diagnosis not present

## 2016-09-30 DIAGNOSIS — N184 Chronic kidney disease, stage 4 (severe): Secondary | ICD-10-CM | POA: Diagnosis not present

## 2016-09-30 DIAGNOSIS — E1122 Type 2 diabetes mellitus with diabetic chronic kidney disease: Secondary | ICD-10-CM | POA: Diagnosis not present

## 2016-09-30 DIAGNOSIS — I6522 Occlusion and stenosis of left carotid artery: Secondary | ICD-10-CM | POA: Diagnosis not present

## 2016-10-03 DIAGNOSIS — I69393 Ataxia following cerebral infarction: Secondary | ICD-10-CM | POA: Diagnosis not present

## 2016-10-03 DIAGNOSIS — I129 Hypertensive chronic kidney disease with stage 1 through stage 4 chronic kidney disease, or unspecified chronic kidney disease: Secondary | ICD-10-CM | POA: Diagnosis not present

## 2016-10-03 DIAGNOSIS — E1122 Type 2 diabetes mellitus with diabetic chronic kidney disease: Secondary | ICD-10-CM | POA: Diagnosis not present

## 2016-10-03 DIAGNOSIS — N184 Chronic kidney disease, stage 4 (severe): Secondary | ICD-10-CM | POA: Diagnosis not present

## 2016-10-03 DIAGNOSIS — I6522 Occlusion and stenosis of left carotid artery: Secondary | ICD-10-CM | POA: Diagnosis not present

## 2016-10-03 DIAGNOSIS — R1310 Dysphagia, unspecified: Secondary | ICD-10-CM | POA: Diagnosis not present

## 2016-10-04 DIAGNOSIS — I69393 Ataxia following cerebral infarction: Secondary | ICD-10-CM | POA: Diagnosis not present

## 2016-10-04 DIAGNOSIS — N184 Chronic kidney disease, stage 4 (severe): Secondary | ICD-10-CM | POA: Diagnosis not present

## 2016-10-04 DIAGNOSIS — R1310 Dysphagia, unspecified: Secondary | ICD-10-CM | POA: Diagnosis not present

## 2016-10-04 DIAGNOSIS — I129 Hypertensive chronic kidney disease with stage 1 through stage 4 chronic kidney disease, or unspecified chronic kidney disease: Secondary | ICD-10-CM | POA: Diagnosis not present

## 2016-10-04 DIAGNOSIS — I6522 Occlusion and stenosis of left carotid artery: Secondary | ICD-10-CM | POA: Diagnosis not present

## 2016-10-04 DIAGNOSIS — E1122 Type 2 diabetes mellitus with diabetic chronic kidney disease: Secondary | ICD-10-CM | POA: Diagnosis not present

## 2016-10-05 DIAGNOSIS — N184 Chronic kidney disease, stage 4 (severe): Secondary | ICD-10-CM | POA: Diagnosis not present

## 2016-10-05 DIAGNOSIS — I129 Hypertensive chronic kidney disease with stage 1 through stage 4 chronic kidney disease, or unspecified chronic kidney disease: Secondary | ICD-10-CM | POA: Diagnosis not present

## 2016-10-05 DIAGNOSIS — I6522 Occlusion and stenosis of left carotid artery: Secondary | ICD-10-CM | POA: Diagnosis not present

## 2016-10-05 DIAGNOSIS — R1310 Dysphagia, unspecified: Secondary | ICD-10-CM | POA: Diagnosis not present

## 2016-10-05 DIAGNOSIS — E1122 Type 2 diabetes mellitus with diabetic chronic kidney disease: Secondary | ICD-10-CM | POA: Diagnosis not present

## 2016-10-05 DIAGNOSIS — I69393 Ataxia following cerebral infarction: Secondary | ICD-10-CM | POA: Diagnosis not present

## 2016-10-06 DIAGNOSIS — R1310 Dysphagia, unspecified: Secondary | ICD-10-CM | POA: Diagnosis not present

## 2016-10-06 DIAGNOSIS — I69393 Ataxia following cerebral infarction: Secondary | ICD-10-CM | POA: Diagnosis not present

## 2016-10-06 DIAGNOSIS — I6522 Occlusion and stenosis of left carotid artery: Secondary | ICD-10-CM | POA: Diagnosis not present

## 2016-10-06 DIAGNOSIS — G4700 Insomnia, unspecified: Secondary | ICD-10-CM | POA: Diagnosis not present

## 2016-10-06 DIAGNOSIS — R0989 Other specified symptoms and signs involving the circulatory and respiratory systems: Secondary | ICD-10-CM | POA: Diagnosis not present

## 2016-10-06 DIAGNOSIS — I129 Hypertensive chronic kidney disease with stage 1 through stage 4 chronic kidney disease, or unspecified chronic kidney disease: Secondary | ICD-10-CM | POA: Diagnosis not present

## 2016-10-06 DIAGNOSIS — N184 Chronic kidney disease, stage 4 (severe): Secondary | ICD-10-CM | POA: Diagnosis not present

## 2016-10-06 DIAGNOSIS — R609 Edema, unspecified: Secondary | ICD-10-CM | POA: Diagnosis not present

## 2016-10-06 DIAGNOSIS — E1122 Type 2 diabetes mellitus with diabetic chronic kidney disease: Secondary | ICD-10-CM | POA: Diagnosis not present

## 2016-10-07 DIAGNOSIS — I69393 Ataxia following cerebral infarction: Secondary | ICD-10-CM | POA: Diagnosis not present

## 2016-10-07 DIAGNOSIS — E1122 Type 2 diabetes mellitus with diabetic chronic kidney disease: Secondary | ICD-10-CM | POA: Diagnosis not present

## 2016-10-07 DIAGNOSIS — R1310 Dysphagia, unspecified: Secondary | ICD-10-CM | POA: Diagnosis not present

## 2016-10-07 DIAGNOSIS — I6522 Occlusion and stenosis of left carotid artery: Secondary | ICD-10-CM | POA: Diagnosis not present

## 2016-10-07 DIAGNOSIS — I129 Hypertensive chronic kidney disease with stage 1 through stage 4 chronic kidney disease, or unspecified chronic kidney disease: Secondary | ICD-10-CM | POA: Diagnosis not present

## 2016-10-07 DIAGNOSIS — N184 Chronic kidney disease, stage 4 (severe): Secondary | ICD-10-CM | POA: Diagnosis not present

## 2016-10-10 DIAGNOSIS — N184 Chronic kidney disease, stage 4 (severe): Secondary | ICD-10-CM | POA: Diagnosis not present

## 2016-10-10 DIAGNOSIS — R1310 Dysphagia, unspecified: Secondary | ICD-10-CM | POA: Diagnosis not present

## 2016-10-10 DIAGNOSIS — I69393 Ataxia following cerebral infarction: Secondary | ICD-10-CM | POA: Diagnosis not present

## 2016-10-10 DIAGNOSIS — I129 Hypertensive chronic kidney disease with stage 1 through stage 4 chronic kidney disease, or unspecified chronic kidney disease: Secondary | ICD-10-CM | POA: Diagnosis not present

## 2016-10-10 DIAGNOSIS — I6522 Occlusion and stenosis of left carotid artery: Secondary | ICD-10-CM | POA: Diagnosis not present

## 2016-10-10 DIAGNOSIS — E1122 Type 2 diabetes mellitus with diabetic chronic kidney disease: Secondary | ICD-10-CM | POA: Diagnosis not present

## 2016-10-11 DIAGNOSIS — N184 Chronic kidney disease, stage 4 (severe): Secondary | ICD-10-CM | POA: Diagnosis not present

## 2016-10-11 DIAGNOSIS — R1310 Dysphagia, unspecified: Secondary | ICD-10-CM | POA: Diagnosis not present

## 2016-10-11 DIAGNOSIS — I69393 Ataxia following cerebral infarction: Secondary | ICD-10-CM | POA: Diagnosis not present

## 2016-10-11 DIAGNOSIS — I129 Hypertensive chronic kidney disease with stage 1 through stage 4 chronic kidney disease, or unspecified chronic kidney disease: Secondary | ICD-10-CM | POA: Diagnosis not present

## 2016-10-11 DIAGNOSIS — E1122 Type 2 diabetes mellitus with diabetic chronic kidney disease: Secondary | ICD-10-CM | POA: Diagnosis not present

## 2016-10-11 DIAGNOSIS — I6522 Occlusion and stenosis of left carotid artery: Secondary | ICD-10-CM | POA: Diagnosis not present

## 2016-10-12 DIAGNOSIS — I129 Hypertensive chronic kidney disease with stage 1 through stage 4 chronic kidney disease, or unspecified chronic kidney disease: Secondary | ICD-10-CM | POA: Diagnosis not present

## 2016-10-12 DIAGNOSIS — R1310 Dysphagia, unspecified: Secondary | ICD-10-CM | POA: Diagnosis not present

## 2016-10-12 DIAGNOSIS — E1122 Type 2 diabetes mellitus with diabetic chronic kidney disease: Secondary | ICD-10-CM | POA: Diagnosis not present

## 2016-10-12 DIAGNOSIS — I6522 Occlusion and stenosis of left carotid artery: Secondary | ICD-10-CM | POA: Diagnosis not present

## 2016-10-12 DIAGNOSIS — N184 Chronic kidney disease, stage 4 (severe): Secondary | ICD-10-CM | POA: Diagnosis not present

## 2016-10-12 DIAGNOSIS — I509 Heart failure, unspecified: Secondary | ICD-10-CM | POA: Diagnosis not present

## 2016-10-12 DIAGNOSIS — I69393 Ataxia following cerebral infarction: Secondary | ICD-10-CM | POA: Diagnosis not present

## 2016-10-13 DIAGNOSIS — I129 Hypertensive chronic kidney disease with stage 1 through stage 4 chronic kidney disease, or unspecified chronic kidney disease: Secondary | ICD-10-CM | POA: Diagnosis not present

## 2016-10-13 DIAGNOSIS — R1310 Dysphagia, unspecified: Secondary | ICD-10-CM | POA: Diagnosis not present

## 2016-10-13 DIAGNOSIS — E1122 Type 2 diabetes mellitus with diabetic chronic kidney disease: Secondary | ICD-10-CM | POA: Diagnosis not present

## 2016-10-13 DIAGNOSIS — N184 Chronic kidney disease, stage 4 (severe): Secondary | ICD-10-CM | POA: Diagnosis not present

## 2016-10-13 DIAGNOSIS — I6522 Occlusion and stenosis of left carotid artery: Secondary | ICD-10-CM | POA: Diagnosis not present

## 2016-10-13 DIAGNOSIS — I69393 Ataxia following cerebral infarction: Secondary | ICD-10-CM | POA: Diagnosis not present

## 2016-10-14 DIAGNOSIS — I69393 Ataxia following cerebral infarction: Secondary | ICD-10-CM | POA: Diagnosis not present

## 2016-10-14 DIAGNOSIS — I6522 Occlusion and stenosis of left carotid artery: Secondary | ICD-10-CM | POA: Diagnosis not present

## 2016-10-14 DIAGNOSIS — I129 Hypertensive chronic kidney disease with stage 1 through stage 4 chronic kidney disease, or unspecified chronic kidney disease: Secondary | ICD-10-CM | POA: Diagnosis not present

## 2016-10-14 DIAGNOSIS — R1310 Dysphagia, unspecified: Secondary | ICD-10-CM | POA: Diagnosis not present

## 2016-10-14 DIAGNOSIS — N184 Chronic kidney disease, stage 4 (severe): Secondary | ICD-10-CM | POA: Diagnosis not present

## 2016-10-14 DIAGNOSIS — E1122 Type 2 diabetes mellitus with diabetic chronic kidney disease: Secondary | ICD-10-CM | POA: Diagnosis not present

## 2016-10-17 DIAGNOSIS — R1310 Dysphagia, unspecified: Secondary | ICD-10-CM | POA: Diagnosis not present

## 2016-10-17 DIAGNOSIS — N184 Chronic kidney disease, stage 4 (severe): Secondary | ICD-10-CM | POA: Diagnosis not present

## 2016-10-17 DIAGNOSIS — I129 Hypertensive chronic kidney disease with stage 1 through stage 4 chronic kidney disease, or unspecified chronic kidney disease: Secondary | ICD-10-CM | POA: Diagnosis not present

## 2016-10-17 DIAGNOSIS — I6522 Occlusion and stenosis of left carotid artery: Secondary | ICD-10-CM | POA: Diagnosis not present

## 2016-10-17 DIAGNOSIS — I69393 Ataxia following cerebral infarction: Secondary | ICD-10-CM | POA: Diagnosis not present

## 2016-10-17 DIAGNOSIS — E1122 Type 2 diabetes mellitus with diabetic chronic kidney disease: Secondary | ICD-10-CM | POA: Diagnosis not present

## 2016-10-18 DIAGNOSIS — N184 Chronic kidney disease, stage 4 (severe): Secondary | ICD-10-CM | POA: Diagnosis not present

## 2016-10-18 DIAGNOSIS — I6522 Occlusion and stenosis of left carotid artery: Secondary | ICD-10-CM | POA: Diagnosis not present

## 2016-10-18 DIAGNOSIS — R1310 Dysphagia, unspecified: Secondary | ICD-10-CM | POA: Diagnosis not present

## 2016-10-18 DIAGNOSIS — I69393 Ataxia following cerebral infarction: Secondary | ICD-10-CM | POA: Diagnosis not present

## 2016-10-18 DIAGNOSIS — E1122 Type 2 diabetes mellitus with diabetic chronic kidney disease: Secondary | ICD-10-CM | POA: Diagnosis not present

## 2016-10-18 DIAGNOSIS — I129 Hypertensive chronic kidney disease with stage 1 through stage 4 chronic kidney disease, or unspecified chronic kidney disease: Secondary | ICD-10-CM | POA: Diagnosis not present

## 2016-10-19 DIAGNOSIS — N184 Chronic kidney disease, stage 4 (severe): Secondary | ICD-10-CM | POA: Diagnosis not present

## 2016-10-19 DIAGNOSIS — R1310 Dysphagia, unspecified: Secondary | ICD-10-CM | POA: Diagnosis not present

## 2016-10-19 DIAGNOSIS — I69393 Ataxia following cerebral infarction: Secondary | ICD-10-CM | POA: Diagnosis not present

## 2016-10-19 DIAGNOSIS — I6522 Occlusion and stenosis of left carotid artery: Secondary | ICD-10-CM | POA: Diagnosis not present

## 2016-10-19 DIAGNOSIS — I129 Hypertensive chronic kidney disease with stage 1 through stage 4 chronic kidney disease, or unspecified chronic kidney disease: Secondary | ICD-10-CM | POA: Diagnosis not present

## 2016-10-19 DIAGNOSIS — E1122 Type 2 diabetes mellitus with diabetic chronic kidney disease: Secondary | ICD-10-CM | POA: Diagnosis not present

## 2016-10-20 DIAGNOSIS — E1122 Type 2 diabetes mellitus with diabetic chronic kidney disease: Secondary | ICD-10-CM | POA: Diagnosis not present

## 2016-10-20 DIAGNOSIS — N184 Chronic kidney disease, stage 4 (severe): Secondary | ICD-10-CM | POA: Diagnosis not present

## 2016-10-20 DIAGNOSIS — I6522 Occlusion and stenosis of left carotid artery: Secondary | ICD-10-CM | POA: Diagnosis not present

## 2016-10-20 DIAGNOSIS — R1310 Dysphagia, unspecified: Secondary | ICD-10-CM | POA: Diagnosis not present

## 2016-10-20 DIAGNOSIS — I129 Hypertensive chronic kidney disease with stage 1 through stage 4 chronic kidney disease, or unspecified chronic kidney disease: Secondary | ICD-10-CM | POA: Diagnosis not present

## 2016-10-20 DIAGNOSIS — I69393 Ataxia following cerebral infarction: Secondary | ICD-10-CM | POA: Diagnosis not present

## 2016-10-21 DIAGNOSIS — R1310 Dysphagia, unspecified: Secondary | ICD-10-CM | POA: Diagnosis not present

## 2016-10-21 DIAGNOSIS — N184 Chronic kidney disease, stage 4 (severe): Secondary | ICD-10-CM | POA: Diagnosis not present

## 2016-10-21 DIAGNOSIS — E1122 Type 2 diabetes mellitus with diabetic chronic kidney disease: Secondary | ICD-10-CM | POA: Diagnosis not present

## 2016-10-21 DIAGNOSIS — I69393 Ataxia following cerebral infarction: Secondary | ICD-10-CM | POA: Diagnosis not present

## 2016-10-21 DIAGNOSIS — I129 Hypertensive chronic kidney disease with stage 1 through stage 4 chronic kidney disease, or unspecified chronic kidney disease: Secondary | ICD-10-CM | POA: Diagnosis not present

## 2016-10-21 DIAGNOSIS — I6522 Occlusion and stenosis of left carotid artery: Secondary | ICD-10-CM | POA: Diagnosis not present

## 2016-10-23 DIAGNOSIS — R1310 Dysphagia, unspecified: Secondary | ICD-10-CM | POA: Diagnosis not present

## 2016-10-23 DIAGNOSIS — N400 Enlarged prostate without lower urinary tract symptoms: Secondary | ICD-10-CM | POA: Diagnosis not present

## 2016-10-23 DIAGNOSIS — I69393 Ataxia following cerebral infarction: Secondary | ICD-10-CM | POA: Diagnosis not present

## 2016-10-23 DIAGNOSIS — G20 Parkinson's disease: Secondary | ICD-10-CM | POA: Diagnosis not present

## 2016-10-23 DIAGNOSIS — I6522 Occlusion and stenosis of left carotid artery: Secondary | ICD-10-CM | POA: Diagnosis not present

## 2016-10-23 DIAGNOSIS — F329 Major depressive disorder, single episode, unspecified: Secondary | ICD-10-CM | POA: Diagnosis not present

## 2016-10-23 DIAGNOSIS — E785 Hyperlipidemia, unspecified: Secondary | ICD-10-CM | POA: Diagnosis not present

## 2016-10-23 DIAGNOSIS — E1122 Type 2 diabetes mellitus with diabetic chronic kidney disease: Secondary | ICD-10-CM | POA: Diagnosis not present

## 2016-10-23 DIAGNOSIS — Z9181 History of falling: Secondary | ICD-10-CM | POA: Diagnosis not present

## 2016-10-23 DIAGNOSIS — N184 Chronic kidney disease, stage 4 (severe): Secondary | ICD-10-CM | POA: Diagnosis not present

## 2016-10-23 DIAGNOSIS — E559 Vitamin D deficiency, unspecified: Secondary | ICD-10-CM | POA: Diagnosis not present

## 2016-10-23 DIAGNOSIS — Z7902 Long term (current) use of antithrombotics/antiplatelets: Secondary | ICD-10-CM | POA: Diagnosis not present

## 2016-10-23 DIAGNOSIS — I130 Hypertensive heart and chronic kidney disease with heart failure and stage 1 through stage 4 chronic kidney disease, or unspecified chronic kidney disease: Secondary | ICD-10-CM | POA: Diagnosis not present

## 2016-10-23 DIAGNOSIS — I509 Heart failure, unspecified: Secondary | ICD-10-CM | POA: Diagnosis not present

## 2016-10-24 DIAGNOSIS — E1122 Type 2 diabetes mellitus with diabetic chronic kidney disease: Secondary | ICD-10-CM | POA: Diagnosis not present

## 2016-10-24 DIAGNOSIS — N184 Chronic kidney disease, stage 4 (severe): Secondary | ICD-10-CM | POA: Diagnosis not present

## 2016-10-24 DIAGNOSIS — I509 Heart failure, unspecified: Secondary | ICD-10-CM | POA: Diagnosis not present

## 2016-10-24 DIAGNOSIS — I69393 Ataxia following cerebral infarction: Secondary | ICD-10-CM | POA: Diagnosis not present

## 2016-10-24 DIAGNOSIS — I130 Hypertensive heart and chronic kidney disease with heart failure and stage 1 through stage 4 chronic kidney disease, or unspecified chronic kidney disease: Secondary | ICD-10-CM | POA: Diagnosis not present

## 2016-10-24 DIAGNOSIS — R1310 Dysphagia, unspecified: Secondary | ICD-10-CM | POA: Diagnosis not present

## 2016-10-25 DIAGNOSIS — F39 Unspecified mood [affective] disorder: Secondary | ICD-10-CM | POA: Diagnosis not present

## 2016-10-25 DIAGNOSIS — F0280 Dementia in other diseases classified elsewhere without behavioral disturbance: Secondary | ICD-10-CM | POA: Diagnosis not present

## 2016-10-25 DIAGNOSIS — E1122 Type 2 diabetes mellitus with diabetic chronic kidney disease: Secondary | ICD-10-CM | POA: Diagnosis not present

## 2016-10-25 DIAGNOSIS — I130 Hypertensive heart and chronic kidney disease with heart failure and stage 1 through stage 4 chronic kidney disease, or unspecified chronic kidney disease: Secondary | ICD-10-CM | POA: Diagnosis not present

## 2016-10-25 DIAGNOSIS — F419 Anxiety disorder, unspecified: Secondary | ICD-10-CM | POA: Diagnosis not present

## 2016-10-25 DIAGNOSIS — I509 Heart failure, unspecified: Secondary | ICD-10-CM | POA: Diagnosis not present

## 2016-10-25 DIAGNOSIS — G20 Parkinson's disease: Secondary | ICD-10-CM | POA: Diagnosis not present

## 2016-10-25 DIAGNOSIS — I69393 Ataxia following cerebral infarction: Secondary | ICD-10-CM | POA: Diagnosis not present

## 2016-10-25 DIAGNOSIS — R1310 Dysphagia, unspecified: Secondary | ICD-10-CM | POA: Diagnosis not present

## 2016-10-25 DIAGNOSIS — N184 Chronic kidney disease, stage 4 (severe): Secondary | ICD-10-CM | POA: Diagnosis not present

## 2016-10-26 DIAGNOSIS — N184 Chronic kidney disease, stage 4 (severe): Secondary | ICD-10-CM | POA: Diagnosis not present

## 2016-10-26 DIAGNOSIS — E1122 Type 2 diabetes mellitus with diabetic chronic kidney disease: Secondary | ICD-10-CM | POA: Diagnosis not present

## 2016-10-26 DIAGNOSIS — I509 Heart failure, unspecified: Secondary | ICD-10-CM | POA: Diagnosis not present

## 2016-10-26 DIAGNOSIS — I69393 Ataxia following cerebral infarction: Secondary | ICD-10-CM | POA: Diagnosis not present

## 2016-10-26 DIAGNOSIS — R1310 Dysphagia, unspecified: Secondary | ICD-10-CM | POA: Diagnosis not present

## 2016-10-26 DIAGNOSIS — I130 Hypertensive heart and chronic kidney disease with heart failure and stage 1 through stage 4 chronic kidney disease, or unspecified chronic kidney disease: Secondary | ICD-10-CM | POA: Diagnosis not present

## 2016-10-27 DIAGNOSIS — I509 Heart failure, unspecified: Secondary | ICD-10-CM | POA: Diagnosis not present

## 2016-10-27 DIAGNOSIS — E1122 Type 2 diabetes mellitus with diabetic chronic kidney disease: Secondary | ICD-10-CM | POA: Diagnosis not present

## 2016-10-27 DIAGNOSIS — I130 Hypertensive heart and chronic kidney disease with heart failure and stage 1 through stage 4 chronic kidney disease, or unspecified chronic kidney disease: Secondary | ICD-10-CM | POA: Diagnosis not present

## 2016-10-27 DIAGNOSIS — I69393 Ataxia following cerebral infarction: Secondary | ICD-10-CM | POA: Diagnosis not present

## 2016-10-27 DIAGNOSIS — R1310 Dysphagia, unspecified: Secondary | ICD-10-CM | POA: Diagnosis not present

## 2016-10-27 DIAGNOSIS — N184 Chronic kidney disease, stage 4 (severe): Secondary | ICD-10-CM | POA: Diagnosis not present

## 2016-10-28 DIAGNOSIS — I69393 Ataxia following cerebral infarction: Secondary | ICD-10-CM | POA: Diagnosis not present

## 2016-10-28 DIAGNOSIS — R1310 Dysphagia, unspecified: Secondary | ICD-10-CM | POA: Diagnosis not present

## 2016-10-28 DIAGNOSIS — E1122 Type 2 diabetes mellitus with diabetic chronic kidney disease: Secondary | ICD-10-CM | POA: Diagnosis not present

## 2016-10-28 DIAGNOSIS — I509 Heart failure, unspecified: Secondary | ICD-10-CM | POA: Diagnosis not present

## 2016-10-28 DIAGNOSIS — I130 Hypertensive heart and chronic kidney disease with heart failure and stage 1 through stage 4 chronic kidney disease, or unspecified chronic kidney disease: Secondary | ICD-10-CM | POA: Diagnosis not present

## 2016-10-28 DIAGNOSIS — N184 Chronic kidney disease, stage 4 (severe): Secondary | ICD-10-CM | POA: Diagnosis not present

## 2016-10-30 DIAGNOSIS — I130 Hypertensive heart and chronic kidney disease with heart failure and stage 1 through stage 4 chronic kidney disease, or unspecified chronic kidney disease: Secondary | ICD-10-CM | POA: Diagnosis not present

## 2016-10-30 DIAGNOSIS — E1122 Type 2 diabetes mellitus with diabetic chronic kidney disease: Secondary | ICD-10-CM | POA: Diagnosis not present

## 2016-10-30 DIAGNOSIS — R1310 Dysphagia, unspecified: Secondary | ICD-10-CM | POA: Diagnosis not present

## 2016-10-30 DIAGNOSIS — N184 Chronic kidney disease, stage 4 (severe): Secondary | ICD-10-CM | POA: Diagnosis not present

## 2016-10-30 DIAGNOSIS — I509 Heart failure, unspecified: Secondary | ICD-10-CM | POA: Diagnosis not present

## 2016-10-30 DIAGNOSIS — I69393 Ataxia following cerebral infarction: Secondary | ICD-10-CM | POA: Diagnosis not present

## 2016-10-31 DIAGNOSIS — I509 Heart failure, unspecified: Secondary | ICD-10-CM | POA: Diagnosis not present

## 2016-10-31 DIAGNOSIS — J45909 Unspecified asthma, uncomplicated: Secondary | ICD-10-CM | POA: Diagnosis not present

## 2016-10-31 DIAGNOSIS — R1310 Dysphagia, unspecified: Secondary | ICD-10-CM | POA: Diagnosis not present

## 2016-10-31 DIAGNOSIS — E1122 Type 2 diabetes mellitus with diabetic chronic kidney disease: Secondary | ICD-10-CM | POA: Diagnosis not present

## 2016-10-31 DIAGNOSIS — I130 Hypertensive heart and chronic kidney disease with heart failure and stage 1 through stage 4 chronic kidney disease, or unspecified chronic kidney disease: Secondary | ICD-10-CM | POA: Diagnosis not present

## 2016-10-31 DIAGNOSIS — I69393 Ataxia following cerebral infarction: Secondary | ICD-10-CM | POA: Diagnosis not present

## 2016-10-31 DIAGNOSIS — G20 Parkinson's disease: Secondary | ICD-10-CM | POA: Diagnosis not present

## 2016-10-31 DIAGNOSIS — N184 Chronic kidney disease, stage 4 (severe): Secondary | ICD-10-CM | POA: Diagnosis not present

## 2016-11-01 DIAGNOSIS — E1122 Type 2 diabetes mellitus with diabetic chronic kidney disease: Secondary | ICD-10-CM | POA: Diagnosis not present

## 2016-11-01 DIAGNOSIS — N184 Chronic kidney disease, stage 4 (severe): Secondary | ICD-10-CM | POA: Diagnosis not present

## 2016-11-01 DIAGNOSIS — I69393 Ataxia following cerebral infarction: Secondary | ICD-10-CM | POA: Diagnosis not present

## 2016-11-01 DIAGNOSIS — I509 Heart failure, unspecified: Secondary | ICD-10-CM | POA: Diagnosis not present

## 2016-11-01 DIAGNOSIS — R1310 Dysphagia, unspecified: Secondary | ICD-10-CM | POA: Diagnosis not present

## 2016-11-01 DIAGNOSIS — Z515 Encounter for palliative care: Secondary | ICD-10-CM | POA: Diagnosis not present

## 2016-11-01 DIAGNOSIS — R0989 Other specified symptoms and signs involving the circulatory and respiratory systems: Secondary | ICD-10-CM | POA: Diagnosis not present

## 2016-11-01 DIAGNOSIS — I130 Hypertensive heart and chronic kidney disease with heart failure and stage 1 through stage 4 chronic kidney disease, or unspecified chronic kidney disease: Secondary | ICD-10-CM | POA: Diagnosis not present

## 2016-11-22 DEATH — deceased

## 2017-03-20 IMAGING — CR DG CHEST 1V
1 series · 1 of 1 positions shown · non-contrast
Comparison: 10/14/2015

CLINICAL DATA: Unwitnessed fall today.  Laceration of forehead.

EXAM:
CHEST 1 VIEW

[dg chest 1 view]
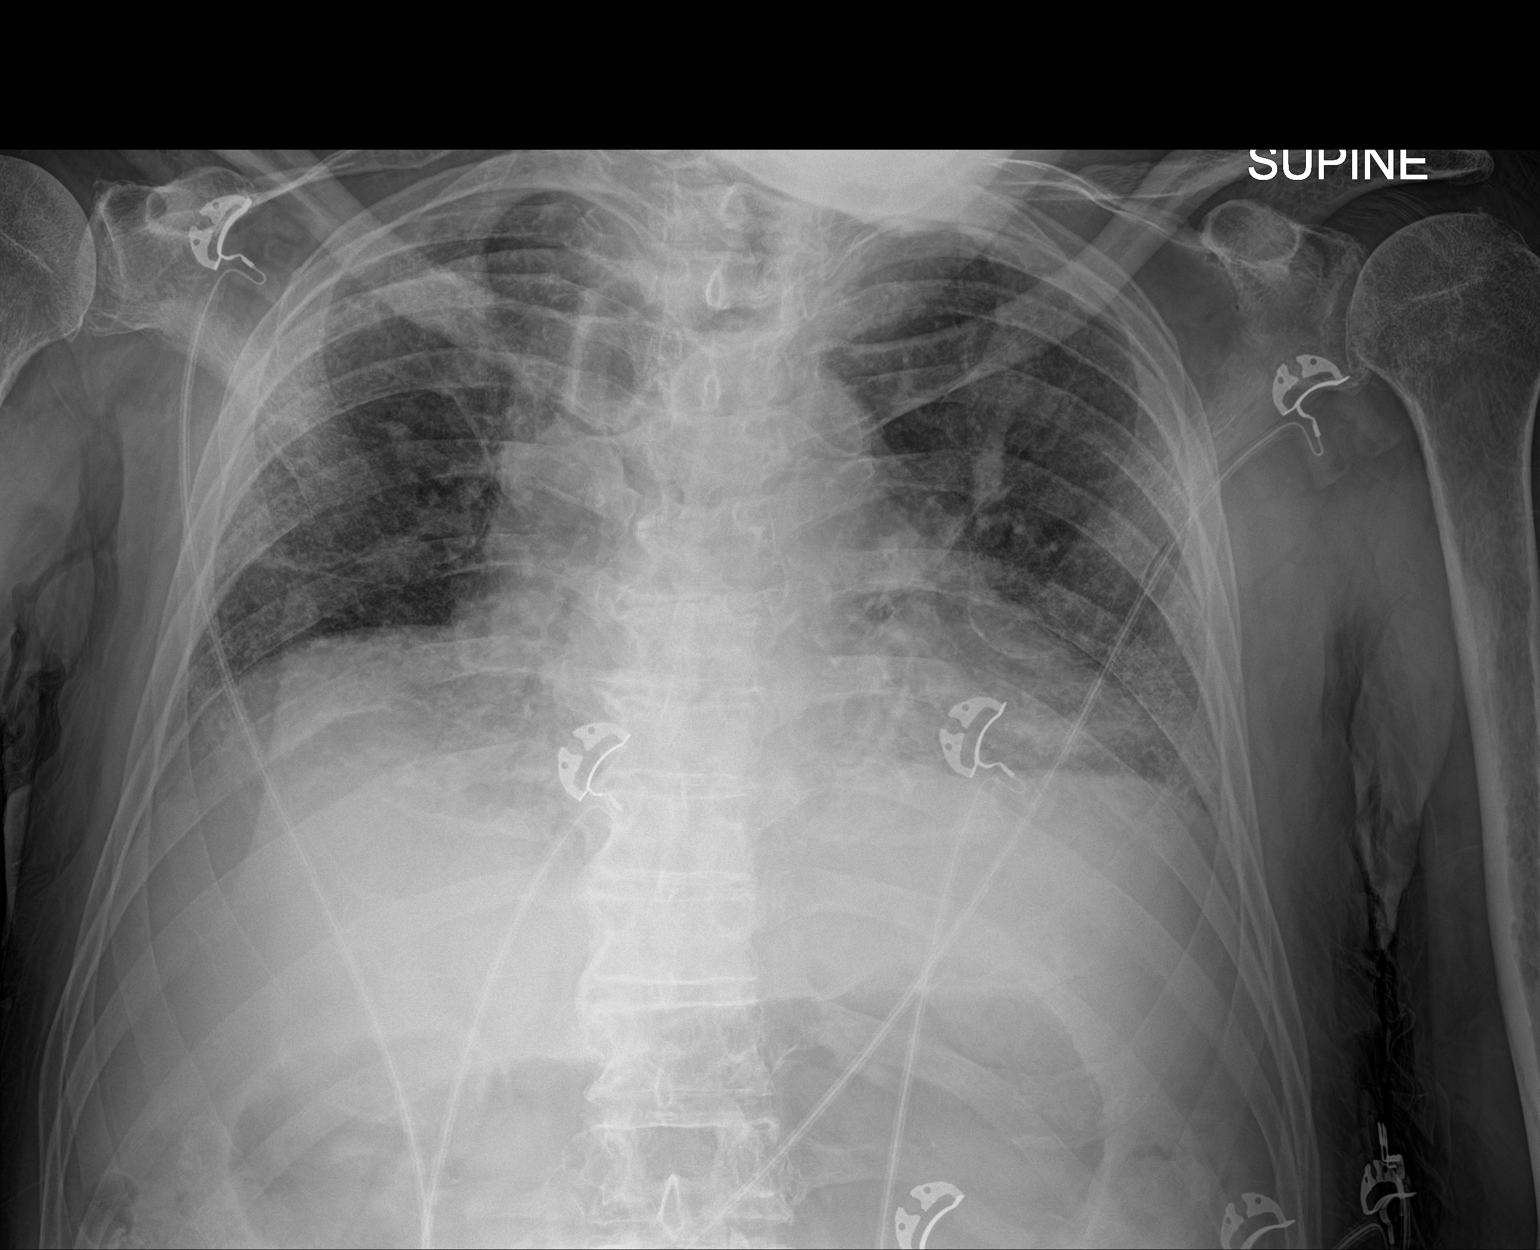

[1 of 1 positions shown; findings below may reference images not displayed]

FINDINGS: Low lung volumes are again noted with crowding of interstitial lung
markings. No confluent airspace disease, effusion or pneumothorax.
No suspicious osseous lesions. The patient's chin obscures the left
lung apex. The cardiac silhouette is top-normal in size. Aortic
atherosclerosis is noted.
IMPRESSION: Low lung volumes with crowding of interstitial lung markings. No
pneumonic consolidation, effusion nor overt pulmonary edema. No
significant change.

## 2017-03-20 IMAGING — CT CT HEAD W/O CM
5 of 9 series · 16 of 47 positions shown, 17 images · non-contrast
Comparison: 12/10/2015

CLINICAL DATA: Unwitnessed fall.

EXAM:
CT HEAD WITHOUT CONTRAST
CT CERVICAL SPINE WITHOUT CONTRAST
TECHNIQUE: Multidetector CT imaging of the head and cervical spine was
performed following the standard protocol without intravenous
contrast. Multiplanar CT image reconstructions of the cervical spine
were also generated.

[Series 2: head wo · axial · 0.49mm/px · z∈[+289,+449]mm · 3 of 33 slices shown, 4 images]
[im 1/33  brain]
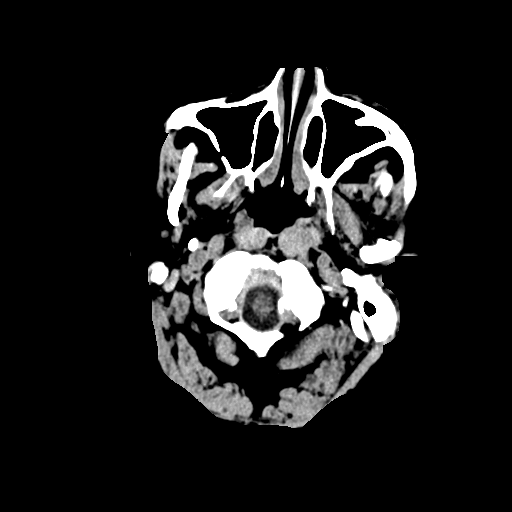
[im 1/33  bone]
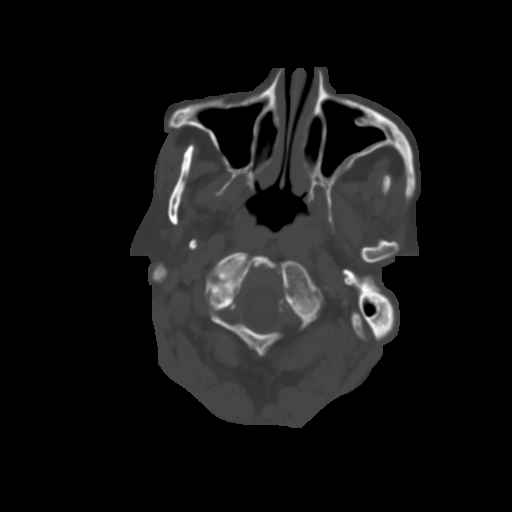
[im 17/33  brain]
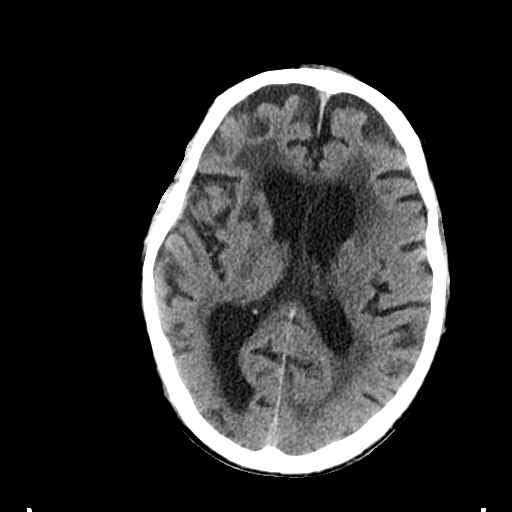
[im 33/33  brain]
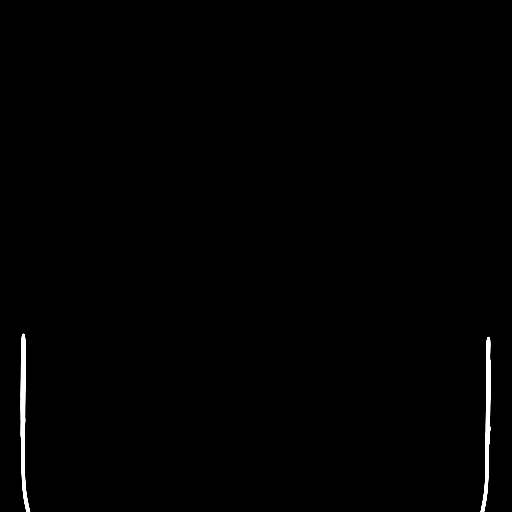

[Series 5: ax head bone recon · axial · 0.36mm/px · z∈[+361,+384]mm · 2 of 74 slices shown]
[im 13/74  bone]
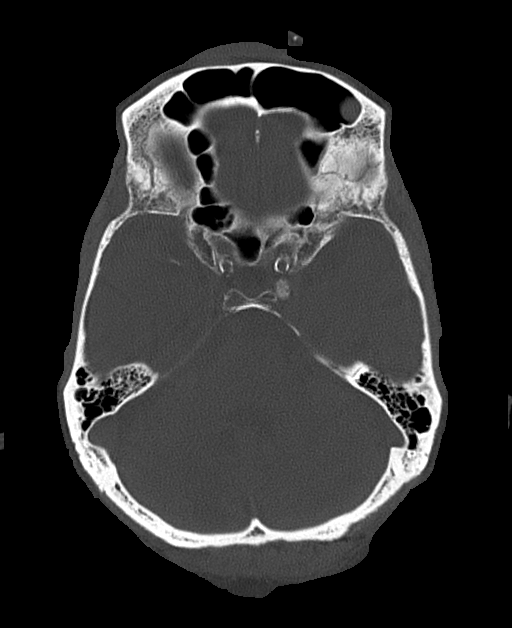
[im 25/74  bone]
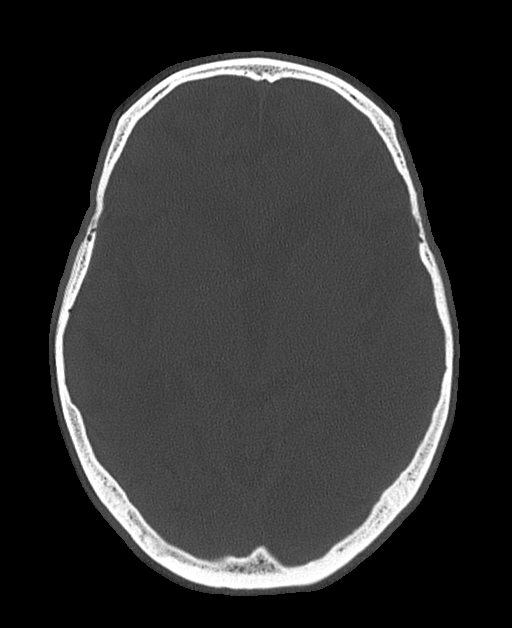

[Series 6: coronal soft tissue · coronal · 0.31mm/px · 3 of 71 slices shown]
[im 24/71  brain]
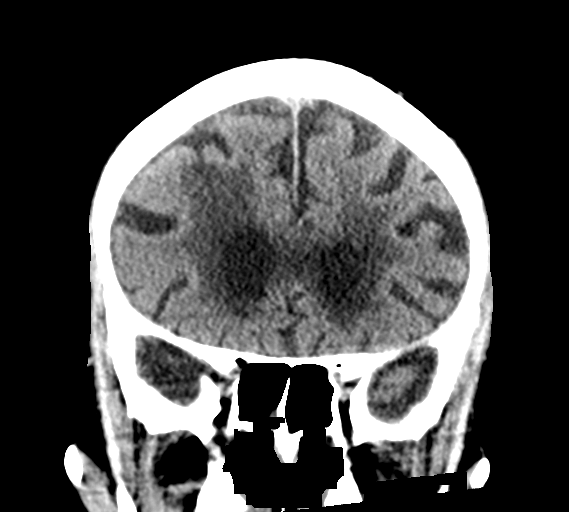
[im 36/71  brain]
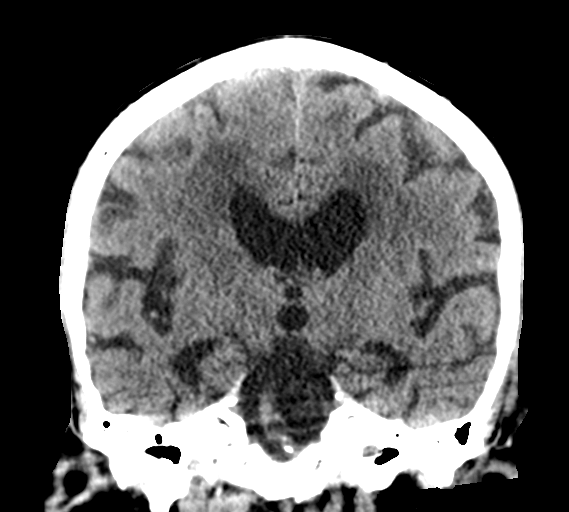
[im 47/71  brain]
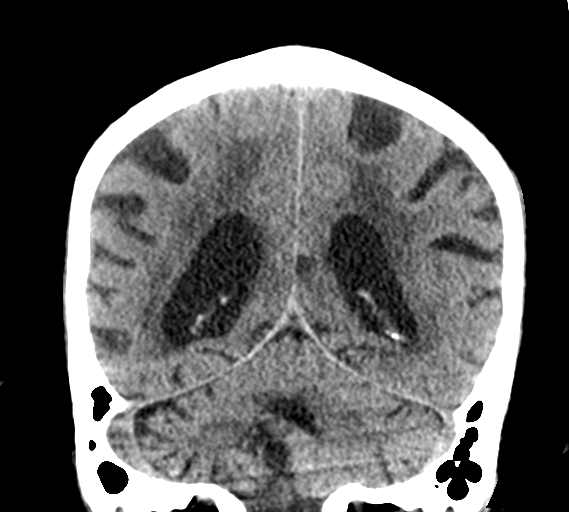

[Series 7: sagittal soft tissue · sagittal · 0.31mm/px · 1 of 49 slices shown]
[im 25/49  brain]
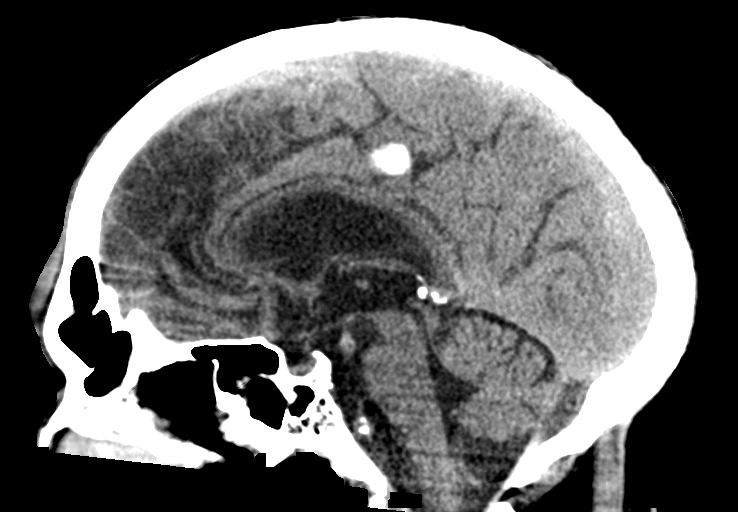

[Series 12: orthogonal bone · axial · 0.25mm/px · z∈[+131,+258]mm · 7 of 98 slices shown]
[im 13/98  bone]
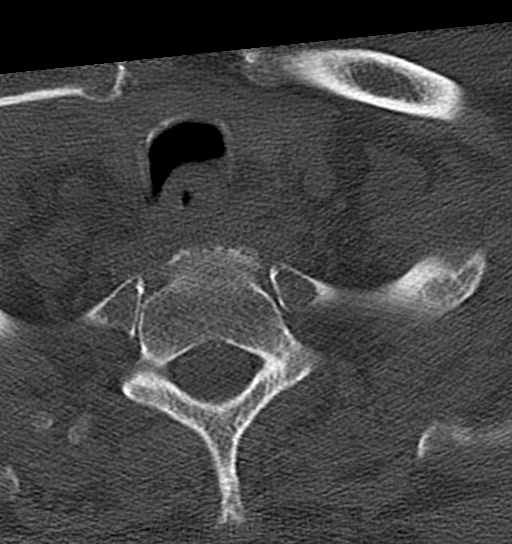
[im 25/98  bone]
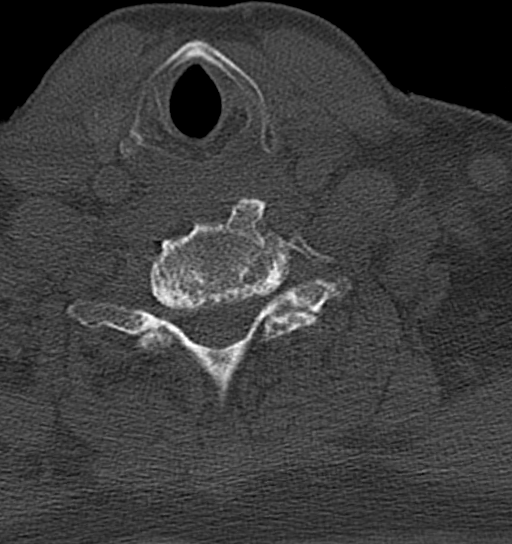
[im 37/98  bone]
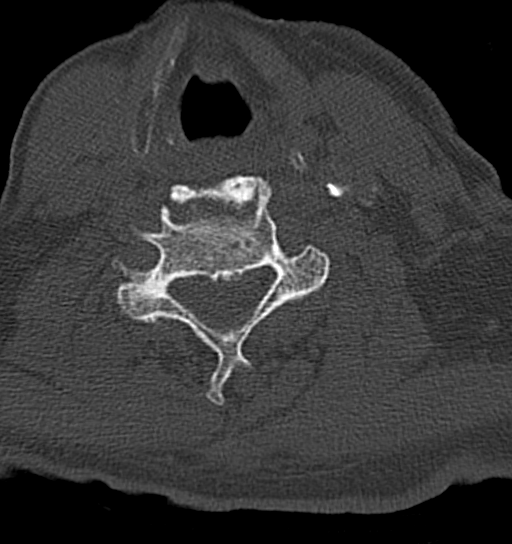
[im 49/98  bone]
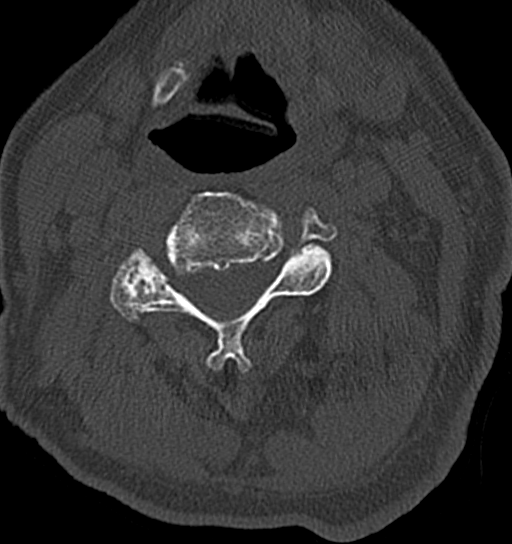
[im 61/98  bone]
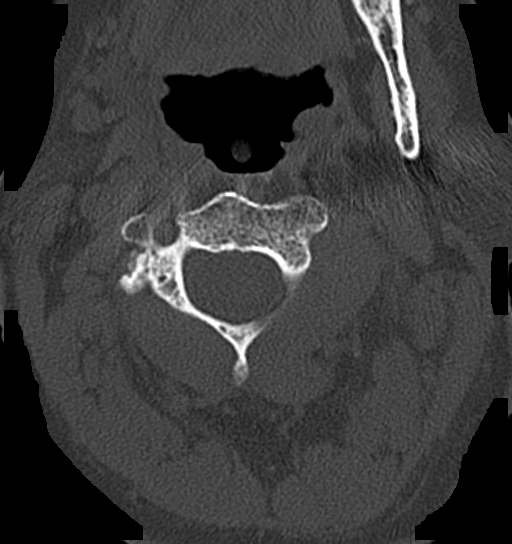
[im 73/98  bone]
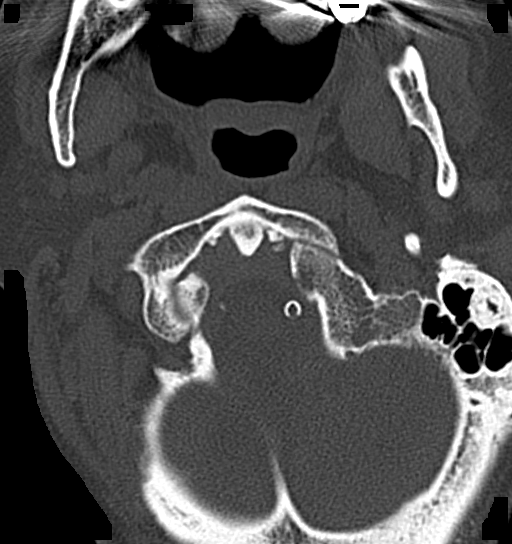
[im 85/98  bone]
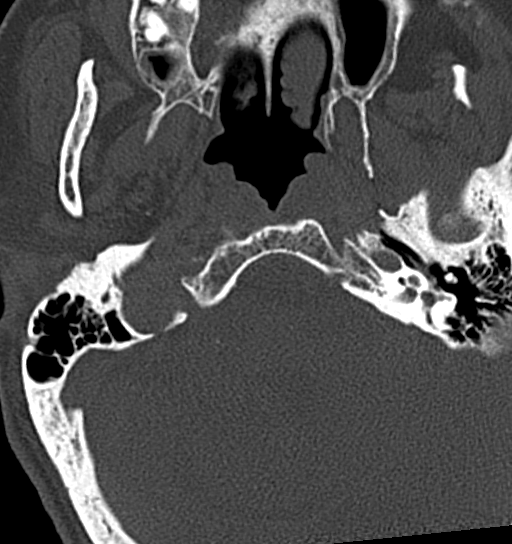

[16 of 47 positions shown; findings below may reference images not displayed]

FINDINGS: CT HEAD FINDINGS

Brain: There is diffuse low attenuation within the subcortical and
periventricular white matter compatible with chronic microvascular
disease. Chronic encephalomalacia involving the right cerebellar
hemisphere noted, image 16 of series 2. Progressive subacute to
chronic right basal ganglia lacunar infarct is identified, image 17
of series 2. There is a subacute to chronic left occipital lobe
infarct which is new from [DATE] tongue, prominent sulci and
ventricles identified compatible with brain atrophy. No evidence for
acute brain infarct, intracranial hemorrhage or mass. No abnormal
extra-axial fluid collections.

Vascular: No hyperdense vessel or unexpected calcification.

Skull: Normal. Negative for fracture or focal lesion.

Sinuses/Orbits: Partial opacification of the frontal sinus, sphenoid
sinus and ethmoid air cells.

Other: Frontal scout hematoma and laceration noted.

CT CERVICAL SPINE FINDINGS

Alignment: Normal.

Skull base and vertebrae: No acute fracture. No primary bone lesion
or focal pathologic process.

Soft tissues and spinal canal: No prevertebral fluid or swelling. No
visible canal hematoma.

Disc levels: There is multi level degenerative disc disease
identified at C3-4, C4-5, C5-6 and C6-C7.

Upper chest: Negative.

Other: None
IMPRESSION: 1. No evidence for skull fracture or acute intracranial hemorrhage.
2. Advanced chronic microvascular disease and brain atrophy.
3. Left occipital lobe and right basal ganglia infarcts are subacute
to chronic but are new from 12/10/2015.
4. No evidence for cervical spine fracture.
5. Multi level degenerative disc disease noted.

## 2017-05-25 ENCOUNTER — Encounter: Payer: Self-pay | Admitting: Internal Medicine
